# Patient Record
Sex: Female | Born: 1945
Health system: Southern US, Community
[De-identification: ages and names within clinical notes are randomized; demographics above are authoritative.]

## PROBLEM LIST (undated history)

## (undated) DIAGNOSIS — E785 Hyperlipidemia, unspecified: Secondary | ICD-10-CM

## (undated) DIAGNOSIS — E119 Type 2 diabetes mellitus without complications: Secondary | ICD-10-CM

## (undated) DIAGNOSIS — I10 Essential (primary) hypertension: Secondary | ICD-10-CM

---

## 2008-09-05 ENCOUNTER — Encounter: Admission: RE | Admit: 2008-09-05 | Discharge: 2008-09-05 | Payer: Self-pay | Admitting: Orthopedic Surgery

## 2014-08-21 ENCOUNTER — Other Ambulatory Visit (HOSPITAL_COMMUNITY): Payer: Self-pay | Admitting: Endocrinology

## 2014-08-21 DIAGNOSIS — Z1231 Encounter for screening mammogram for malignant neoplasm of breast: Secondary | ICD-10-CM

## 2014-09-11 ENCOUNTER — Ambulatory Visit (HOSPITAL_COMMUNITY)
Admission: RE | Admit: 2014-09-11 | Discharge: 2014-09-11 | Disposition: A | Discharge status: Home / Self Care | Payer: Medicare Other | Source: Ambulatory Visit | Attending: Endocrinology | Admitting: Endocrinology

## 2014-09-11 DIAGNOSIS — Z1231 Encounter for screening mammogram for malignant neoplasm of breast: Secondary | ICD-10-CM | POA: Insufficient documentation

## 2015-01-10 DIAGNOSIS — E118 Type 2 diabetes mellitus with unspecified complications: Secondary | ICD-10-CM | POA: Diagnosis not present

## 2015-01-10 DIAGNOSIS — E789 Disorder of lipoprotein metabolism, unspecified: Secondary | ICD-10-CM | POA: Diagnosis not present

## 2015-01-17 DIAGNOSIS — I10 Essential (primary) hypertension: Secondary | ICD-10-CM | POA: Diagnosis not present

## 2015-01-17 DIAGNOSIS — E118 Type 2 diabetes mellitus with unspecified complications: Secondary | ICD-10-CM | POA: Diagnosis not present

## 2015-01-17 DIAGNOSIS — E789 Disorder of lipoprotein metabolism, unspecified: Secondary | ICD-10-CM | POA: Diagnosis not present

## 2015-05-10 DIAGNOSIS — E119 Type 2 diabetes mellitus without complications: Secondary | ICD-10-CM | POA: Diagnosis not present

## 2015-05-10 DIAGNOSIS — I10 Essential (primary) hypertension: Secondary | ICD-10-CM | POA: Diagnosis not present

## 2015-05-10 DIAGNOSIS — E789 Disorder of lipoprotein metabolism, unspecified: Secondary | ICD-10-CM | POA: Diagnosis not present

## 2015-05-22 DIAGNOSIS — I10 Essential (primary) hypertension: Secondary | ICD-10-CM | POA: Diagnosis not present

## 2015-05-22 DIAGNOSIS — E118 Type 2 diabetes mellitus with unspecified complications: Secondary | ICD-10-CM | POA: Diagnosis not present

## 2015-05-22 DIAGNOSIS — E789 Disorder of lipoprotein metabolism, unspecified: Secondary | ICD-10-CM | POA: Diagnosis not present

## 2015-10-17 DIAGNOSIS — I10 Essential (primary) hypertension: Secondary | ICD-10-CM | POA: Diagnosis not present

## 2015-10-17 DIAGNOSIS — E789 Disorder of lipoprotein metabolism, unspecified: Secondary | ICD-10-CM | POA: Diagnosis not present

## 2015-10-17 DIAGNOSIS — E7889 Other lipoprotein metabolism disorders: Secondary | ICD-10-CM | POA: Diagnosis not present

## 2015-10-17 DIAGNOSIS — Z0000 Encounter for general adult medical examination without abnormal findings: Secondary | ICD-10-CM | POA: Diagnosis not present

## 2015-10-17 DIAGNOSIS — E119 Type 2 diabetes mellitus without complications: Secondary | ICD-10-CM | POA: Diagnosis not present

## 2015-10-17 DIAGNOSIS — N390 Urinary tract infection, site not specified: Secondary | ICD-10-CM | POA: Diagnosis not present

## 2015-10-24 DIAGNOSIS — Z0000 Encounter for general adult medical examination without abnormal findings: Secondary | ICD-10-CM | POA: Diagnosis not present

## 2015-10-24 DIAGNOSIS — Z23 Encounter for immunization: Secondary | ICD-10-CM | POA: Diagnosis not present

## 2015-10-24 DIAGNOSIS — M1990 Unspecified osteoarthritis, unspecified site: Secondary | ICD-10-CM | POA: Diagnosis not present

## 2015-10-24 DIAGNOSIS — I10 Essential (primary) hypertension: Secondary | ICD-10-CM | POA: Diagnosis not present

## 2015-10-24 DIAGNOSIS — N390 Urinary tract infection, site not specified: Secondary | ICD-10-CM | POA: Diagnosis not present

## 2015-10-25 DIAGNOSIS — N390 Urinary tract infection, site not specified: Secondary | ICD-10-CM | POA: Diagnosis not present

## 2015-11-04 DIAGNOSIS — H524 Presbyopia: Secondary | ICD-10-CM | POA: Diagnosis not present

## 2015-11-04 DIAGNOSIS — E119 Type 2 diabetes mellitus without complications: Secondary | ICD-10-CM | POA: Diagnosis not present

## 2015-11-04 DIAGNOSIS — H52223 Regular astigmatism, bilateral: Secondary | ICD-10-CM | POA: Diagnosis not present

## 2015-11-04 DIAGNOSIS — H5213 Myopia, bilateral: Secondary | ICD-10-CM | POA: Diagnosis not present

## 2015-11-12 DIAGNOSIS — M6281 Muscle weakness (generalized): Secondary | ICD-10-CM | POA: Diagnosis not present

## 2015-11-12 DIAGNOSIS — R262 Difficulty in walking, not elsewhere classified: Secondary | ICD-10-CM | POA: Diagnosis not present

## 2015-11-12 DIAGNOSIS — W010XXD Fall on same level from slipping, tripping and stumbling without subsequent striking against object, subsequent encounter: Secondary | ICD-10-CM | POA: Diagnosis not present

## 2015-11-12 DIAGNOSIS — R531 Weakness: Secondary | ICD-10-CM | POA: Diagnosis not present

## 2016-01-20 DIAGNOSIS — E119 Type 2 diabetes mellitus without complications: Secondary | ICD-10-CM | POA: Diagnosis not present

## 2016-01-20 DIAGNOSIS — E789 Disorder of lipoprotein metabolism, unspecified: Secondary | ICD-10-CM | POA: Diagnosis not present

## 2016-01-20 DIAGNOSIS — I10 Essential (primary) hypertension: Secondary | ICD-10-CM | POA: Diagnosis not present

## 2016-01-20 DIAGNOSIS — N390 Urinary tract infection, site not specified: Secondary | ICD-10-CM | POA: Diagnosis not present

## 2016-01-27 DIAGNOSIS — N390 Urinary tract infection, site not specified: Secondary | ICD-10-CM | POA: Diagnosis not present

## 2016-01-27 DIAGNOSIS — E118 Type 2 diabetes mellitus with unspecified complications: Secondary | ICD-10-CM | POA: Diagnosis not present

## 2016-01-27 DIAGNOSIS — I10 Essential (primary) hypertension: Secondary | ICD-10-CM | POA: Diagnosis not present

## 2016-06-15 ENCOUNTER — Other Ambulatory Visit: Payer: Self-pay | Admitting: Endocrinology

## 2016-06-15 DIAGNOSIS — Z1231 Encounter for screening mammogram for malignant neoplasm of breast: Secondary | ICD-10-CM

## 2016-06-22 ENCOUNTER — Ambulatory Visit
Admission: RE | Admit: 2016-06-22 | Discharge: 2016-06-22 | Disposition: A | Discharge status: Home / Self Care | Payer: Medicare Other | Source: Ambulatory Visit | Attending: Endocrinology | Admitting: Endocrinology

## 2016-06-22 DIAGNOSIS — Z1231 Encounter for screening mammogram for malignant neoplasm of breast: Secondary | ICD-10-CM

## 2016-11-09 DIAGNOSIS — N390 Urinary tract infection, site not specified: Secondary | ICD-10-CM | POA: Diagnosis not present

## 2016-11-09 DIAGNOSIS — I10 Essential (primary) hypertension: Secondary | ICD-10-CM | POA: Diagnosis not present

## 2016-11-09 DIAGNOSIS — E789 Disorder of lipoprotein metabolism, unspecified: Secondary | ICD-10-CM | POA: Diagnosis not present

## 2016-11-09 DIAGNOSIS — Z0000 Encounter for general adult medical examination without abnormal findings: Secondary | ICD-10-CM | POA: Diagnosis not present

## 2016-11-09 DIAGNOSIS — E119 Type 2 diabetes mellitus without complications: Secondary | ICD-10-CM | POA: Diagnosis not present

## 2016-11-19 DIAGNOSIS — I10 Essential (primary) hypertension: Secondary | ICD-10-CM | POA: Diagnosis not present

## 2016-11-19 DIAGNOSIS — E789 Disorder of lipoprotein metabolism, unspecified: Secondary | ICD-10-CM | POA: Diagnosis not present

## 2016-11-19 DIAGNOSIS — E118 Type 2 diabetes mellitus with unspecified complications: Secondary | ICD-10-CM | POA: Diagnosis not present

## 2016-12-15 ENCOUNTER — Emergency Department (HOSPITAL_COMMUNITY): Payer: Medicare Other

## 2016-12-15 ENCOUNTER — Emergency Department (HOSPITAL_BASED_OUTPATIENT_CLINIC_OR_DEPARTMENT_OTHER): Admit: 2016-12-15 | Discharge: 2016-12-15 | Disposition: A | Discharge status: Home / Self Care | Payer: Medicare Other

## 2016-12-15 ENCOUNTER — Encounter (HOSPITAL_COMMUNITY): Payer: Self-pay | Admitting: Emergency Medicine

## 2016-12-15 ENCOUNTER — Observation Stay (HOSPITAL_COMMUNITY)
Admission: EM | Admit: 2016-12-15 | Discharge: 2016-12-17 | Disposition: A | Discharge status: Skilled Nursing Facility | Payer: Medicare Other | Attending: Family Medicine | Admitting: Family Medicine

## 2016-12-15 DIAGNOSIS — E669 Obesity, unspecified: Secondary | ICD-10-CM | POA: Insufficient documentation

## 2016-12-15 DIAGNOSIS — I10 Essential (primary) hypertension: Secondary | ICD-10-CM | POA: Diagnosis present

## 2016-12-15 DIAGNOSIS — Y92002 Bathroom of unspecified non-institutional (private) residence single-family (private) house as the place of occurrence of the external cause: Secondary | ICD-10-CM | POA: Diagnosis not present

## 2016-12-15 DIAGNOSIS — Z23 Encounter for immunization: Secondary | ICD-10-CM | POA: Diagnosis not present

## 2016-12-15 DIAGNOSIS — E860 Dehydration: Secondary | ICD-10-CM | POA: Diagnosis not present

## 2016-12-15 DIAGNOSIS — S8251XA Displaced fracture of medial malleolus of right tibia, initial encounter for closed fracture: Secondary | ICD-10-CM | POA: Diagnosis not present

## 2016-12-15 DIAGNOSIS — E119 Type 2 diabetes mellitus without complications: Secondary | ICD-10-CM

## 2016-12-15 DIAGNOSIS — Z7984 Long term (current) use of oral hypoglycemic drugs: Secondary | ICD-10-CM | POA: Diagnosis not present

## 2016-12-15 DIAGNOSIS — Z888 Allergy status to other drugs, medicaments and biological substances status: Secondary | ICD-10-CM | POA: Insufficient documentation

## 2016-12-15 DIAGNOSIS — E870 Hyperosmolality and hypernatremia: Secondary | ICD-10-CM | POA: Diagnosis not present

## 2016-12-15 DIAGNOSIS — M1711 Unilateral primary osteoarthritis, right knee: Secondary | ICD-10-CM | POA: Diagnosis not present

## 2016-12-15 DIAGNOSIS — Y9389 Activity, other specified: Secondary | ICD-10-CM | POA: Insufficient documentation

## 2016-12-15 DIAGNOSIS — S82843A Displaced bimalleolar fracture of unspecified lower leg, initial encounter for closed fracture: Secondary | ICD-10-CM | POA: Diagnosis present

## 2016-12-15 DIAGNOSIS — E876 Hypokalemia: Secondary | ICD-10-CM | POA: Diagnosis not present

## 2016-12-15 DIAGNOSIS — T148XXA Other injury of unspecified body region, initial encounter: Secondary | ICD-10-CM | POA: Diagnosis not present

## 2016-12-15 DIAGNOSIS — M79609 Pain in unspecified limb: Secondary | ICD-10-CM | POA: Diagnosis not present

## 2016-12-15 DIAGNOSIS — S8991XA Unspecified injury of right lower leg, initial encounter: Secondary | ICD-10-CM | POA: Diagnosis not present

## 2016-12-15 DIAGNOSIS — M25561 Pain in right knee: Secondary | ICD-10-CM | POA: Diagnosis not present

## 2016-12-15 DIAGNOSIS — E785 Hyperlipidemia, unspecified: Secondary | ICD-10-CM | POA: Diagnosis not present

## 2016-12-15 DIAGNOSIS — Z7982 Long term (current) use of aspirin: Secondary | ICD-10-CM | POA: Diagnosis not present

## 2016-12-15 DIAGNOSIS — Y998 Other external cause status: Secondary | ICD-10-CM | POA: Diagnosis not present

## 2016-12-15 DIAGNOSIS — Y92009 Unspecified place in unspecified non-institutional (private) residence as the place of occurrence of the external cause: Secondary | ICD-10-CM

## 2016-12-15 DIAGNOSIS — S82891A Other fracture of right lower leg, initial encounter for closed fracture: Secondary | ICD-10-CM | POA: Diagnosis not present

## 2016-12-15 DIAGNOSIS — S82841A Displaced bimalleolar fracture of right lower leg, initial encounter for closed fracture: Principal | ICD-10-CM | POA: Insufficient documentation

## 2016-12-15 DIAGNOSIS — W010XXA Fall on same level from slipping, tripping and stumbling without subsequent striking against object, initial encounter: Secondary | ICD-10-CM | POA: Insufficient documentation

## 2016-12-15 DIAGNOSIS — W19XXXA Unspecified fall, initial encounter: Secondary | ICD-10-CM

## 2016-12-15 DIAGNOSIS — M7989 Other specified soft tissue disorders: Secondary | ICD-10-CM

## 2016-12-15 HISTORY — DX: Type 2 diabetes mellitus without complications: E11.9

## 2016-12-15 HISTORY — DX: Essential (primary) hypertension: I10

## 2016-12-15 HISTORY — DX: Hyperlipidemia, unspecified: E78.5

## 2016-12-15 LAB — CBC WITH DIFFERENTIAL/PLATELET
Basophils Absolute: 0 10*3/uL (ref 0.0–0.1)
Basophils Relative: 0 %
EOS ABS: 0.1 10*3/uL (ref 0.0–0.7)
Eosinophils Relative: 0 %
HCT: 49.6 % — ABNORMAL HIGH (ref 36.0–46.0)
HEMOGLOBIN: 16.9 g/dL — AB (ref 12.0–15.0)
LYMPHS ABS: 2.2 10*3/uL (ref 0.7–4.0)
LYMPHS PCT: 14 %
MCH: 29.7 pg (ref 26.0–34.0)
MCHC: 34.1 g/dL (ref 30.0–36.0)
MCV: 87.2 fL (ref 78.0–100.0)
MONOS PCT: 10 %
Monocytes Absolute: 1.5 10*3/uL — ABNORMAL HIGH (ref 0.1–1.0)
NEUTROS PCT: 76 %
Neutro Abs: 12 10*3/uL — ABNORMAL HIGH (ref 1.7–7.7)
Platelets: 210 10*3/uL (ref 150–400)
RBC: 5.69 MIL/uL — AB (ref 3.87–5.11)
RDW: 13.7 % (ref 11.5–15.5)
WBC: 15.8 10*3/uL — ABNORMAL HIGH (ref 4.0–10.5)

## 2016-12-15 LAB — BASIC METABOLIC PANEL
Anion gap: 10 (ref 5–15)
BUN: 23 mg/dL — AB (ref 6–20)
CHLORIDE: 108 mmol/L (ref 101–111)
CO2: 29 mmol/L (ref 22–32)
CREATININE: 0.77 mg/dL (ref 0.44–1.00)
Calcium: 9.2 mg/dL (ref 8.9–10.3)
GFR calc Af Amer: >60 mL/min (ref >60)
GFR calc non Af Amer: >60 mL/min (ref >60)
GLUCOSE: 154 mg/dL — AB (ref 65–99)
POTASSIUM: 3.1 mmol/L — AB (ref 3.5–5.1)
SODIUM: 147 mmol/L — AB (ref 135–145)

## 2016-12-15 LAB — CK: Total CK: 161 U/L (ref 38–234)

## 2016-12-15 MED ORDER — IBUPROFEN 200 MG PO TABS: 400.0000 mg | ORAL_TABLET | Freq: Four times a day (QID) | ORAL | Status: DC | PRN

## 2016-12-15 MED ORDER — AMLODIPINE BESYLATE 5 MG PO TABS
5.0000 mg | ORAL_TABLET | Freq: Every day | ORAL | Status: DC
  Administered 2016-12-16 – 2016-12-17 (×2): 5 mg via ORAL
  Filled 2016-12-15 (×2): qty 1

## 2016-12-15 MED ORDER — SODIUM CHLORIDE 0.9 % IV SOLN
INTRAVENOUS | Status: DC
  Administered 2016-12-15 – 2016-12-17 (×3): via INTRAVENOUS

## 2016-12-15 MED ORDER — ASPIRIN EC 81 MG PO TBEC
81.0000 mg | DELAYED_RELEASE_TABLET | Freq: Every day | ORAL | Status: DC
  Administered 2016-12-16 – 2016-12-17 (×2): 81 mg via ORAL
  Filled 2016-12-15 (×2): qty 1

## 2016-12-15 MED ORDER — HYDROCHLOROTHIAZIDE 25 MG PO TABS: 25.0000 mg | ORAL_TABLET | Freq: Every day | ORAL | Status: DC

## 2016-12-15 MED ORDER — SODIUM CHLORIDE 0.9 % IV BOLUS (SEPSIS): 500.0000 mL | Freq: Once | INTRAVENOUS | Status: DC

## 2016-12-15 MED ORDER — ADULT MULTIVITAMIN W/MINERALS CH
1.0000 | ORAL_TABLET | Freq: Every day | ORAL | Status: DC
  Administered 2016-12-16 – 2016-12-17 (×2): 1 via ORAL
  Filled 2016-12-15 (×2): qty 1

## 2016-12-15 MED ORDER — POTASSIUM CHLORIDE CRYS ER 20 MEQ PO TBCR
40.0000 meq | EXTENDED_RELEASE_TABLET | Freq: Once | ORAL | Status: AC
  Administered 2016-12-15: 40 meq via ORAL
  Filled 2016-12-15: qty 2

## 2016-12-15 MED ORDER — METFORMIN HCL ER 500 MG PO TB24
1000.0000 mg | ORAL_TABLET | Freq: Every day | ORAL | Status: DC
  Administered 2016-12-16: 1000 mg via ORAL
  Filled 2016-12-15: qty 2

## 2016-12-15 MED ORDER — IRBESARTAN 150 MG PO TABS
300.0000 mg | ORAL_TABLET | Freq: Every day | ORAL | Status: DC
  Administered 2016-12-16 – 2016-12-17 (×2): 300 mg via ORAL
  Filled 2016-12-15 (×2): qty 2

## 2016-12-15 MED ORDER — POTASSIUM CHLORIDE CRYS ER 20 MEQ PO TBCR
40.0000 meq | EXTENDED_RELEASE_TABLET | Freq: Once | ORAL | Status: AC
  Administered 2016-12-15: 40 meq via ORAL

## 2016-12-15 MED ORDER — CANAGLIFLOZIN 300 MG PO TABS
300.0000 mg | ORAL_TABLET | Freq: Every day | ORAL | Status: DC
  Administered 2016-12-16: 300 mg via ORAL
  Filled 2016-12-15: qty 1

## 2016-12-15 MED ORDER — ENOXAPARIN SODIUM 40 MG/0.4ML ~~LOC~~ SOLN
40.0000 mg | Freq: Every day | SUBCUTANEOUS | Status: DC
  Administered 2016-12-15 – 2016-12-16 (×2): 40 mg via SUBCUTANEOUS
  Filled 2016-12-15 (×2): qty 0.4

## 2016-12-15 MED ORDER — NEBIVOLOL HCL 10 MG PO TABS
10.0000 mg | ORAL_TABLET | Freq: Every day | ORAL | Status: DC
Start: 2016-12-16 — End: 2016-12-17
  Administered 2016-12-16 – 2016-12-17 (×2): 10 mg via ORAL
  Filled 2016-12-15 (×2): qty 1

## 2016-12-15 MED ORDER — OLMESARTAN MEDOXOMIL-HCTZ 40-25 MG PO TABS: 1.0000 | ORAL_TABLET | Freq: Every day | ORAL | Status: DC

## 2016-12-15 MED ORDER — OXYCODONE-ACETAMINOPHEN 5-325 MG PO TABS: 1.0000 | ORAL_TABLET | Freq: Once | ORAL | Status: DC

## 2016-12-15 MED ORDER — DAPAGLIFLOZIN PRO-METFORMIN ER 10-1000 MG PO TB24: 1.0000 | ORAL_TABLET | Freq: Every day | ORAL | Status: DC

## 2016-12-15 MED ORDER — ATORVASTATIN CALCIUM 10 MG PO TABS
20.0000 mg | ORAL_TABLET | Freq: Every day | ORAL | Status: DC
  Administered 2016-12-16 – 2016-12-17 (×2): 20 mg via ORAL
  Filled 2016-12-15 (×2): qty 2

## 2016-12-15 NOTE — Progress Notes (Signed)
EDCM went to speak to patient at bedside, however staff at bedside performing procedure.

## 2016-12-15 NOTE — ED Provider Notes (Signed)
Patient reports she fell twice 5 days ago and again 4 days ago. She could not get up after the second fall and landed floor until today when her son discovered her and called 911. She complains of right leg pain from the knee distally. She offers no other complaint. On exam she is alert no distress H HEENT exam psych atraumatic neck supple abdomen nondistended nontender. Pelvis stable nontender. Right lower extremity skin is intact. She is tender at the anterior knee. She is mildly tender at the calf. Ankle and foot are nontender. DP pulse 2+ good capillary refill. All other extremities no contusion abrasion or tenderness neurovascularly intact. Entire spine is nontender.   Doug SouSam Sylva Overley, MD 12/15/16 [phone]

## 2016-12-15 NOTE — ED Notes (Signed)
Patient transported to X-ray 

## 2016-12-15 NOTE — Progress Notes (Signed)
EDCM spoke to patient at bedside. Patient presents to ED post fall sustaining right ankle fracture.  Patient lives alone at home.  She reports she has three steps to get into her house.  She has never had home health services before.  She reports she has friend named Porfirio MylarCarmen who lives next door who she "thinks" may be able to help her at home.  She is unsure.  Patient has a son in [place] who is a physician at a hospital.  [facility] to speak to her son and to tell him that her ankle was broken.  Patient is agreeable to go to SNF if needed.  She reports she wants to go where it is the safest for her.  She reports she does not have any equipment at home.  EDCM provided patient [place], explained services.  EDCm also provided patient with list of private duty nursing agencies and explained it would be an out of pocket expense.   Patient reports she is not having any pain. EDCM spoke to patient's son [name].  [name] is agreeable for patient to go to a SNF as he would like the safest disposition for his mother.  He reports he could maybe take her for a few days but will be going out of town. EDCM informed [name] that patient will be safe in the Ed this evening, will be evaluated by PT in the am to determine safest disposition for patient.  EDCM also explained home health services and private duty nursing services to patient's son. Patient's son asking what will be covered [facility] for [facility] stay.  EDCM explained to patient's son that Medicare usually covers the first 20 days at 100% and starting day 21 patient is responsible for 20 %.  EDCM informed patient's son unsure if [facility] patient's stay at SNF and will have social worker contact him tomorrow for more details. Informed patient's son that if patient is unable to be placed, patient will go home with home health services.   Patient's son is asking why patient is not being admitted as observation  for intractable pain and PT eval.  EDCM explained to patient's son that patient reports she is not having any pain and is comfortable.  Explained that a safe disposition can be obtained from the ED.  Patient's son still asking why patient is not being admitted and also wondering what type of fracture she has and did an orthopedic specialist see the patient.  EDCM received Peter's permission to have EDPA contact him in regards to disposition and if orthopedic surgeon saw patient.  EDCM informed Shawn PA to call patient's son [name] at [phone].  No further EDCM needs at this time.

## 2016-12-15 NOTE — ED Triage Notes (Signed)
Pt slipped in bathroom Friday, December 22nd and has been lying on bathroom floor since then. Son called police to check on her today and discovered her on the floor. Pt c/o RLE pain, no deformity. Pt denies LOC and arrived alert and oriented.

## 2016-12-15 NOTE — ED Notes (Signed)
Called report to [phone]. Nurse Almedia BallsLakia, RN is in with a patient and will call back for report.

## 2016-12-15 NOTE — ED Notes (Signed)
Spoke with son, Alycia Patteneter [place] [phone], he wishes for her to have a home health eval.

## 2016-12-15 NOTE — H&P (Signed)
History and Physical    Loretta DibbleCarol A [name] ZOX:[id number] DOB: 08/23/1946 DOA: 12/15/2016   PCP: Michiel SitesKOHUT,WALTER DENNIS, MD Chief Complaint:  Chief Complaint  Patient presents with  . Fall  . Leg Pain    HPI: Loretta DibbleCarol A [name] is a 70 y.o. female with medical history significant of DM, HTN, obesity.  Patient presents to the ED with c/o being found down in bathroom by Son who called 911.  Patient fell 2x: first time 5 days ago and then again 4 days ago.  Couldn't get up after second fall.  Remained on floor until son found her today and called 911.  Has R leg pain from knee distally.  ED Course: Bimaleolar R ankle fracture.  Review of Systems: As per HPI otherwise 10 point review of systems negative.    Past Medical History:  Diagnosis Date  . Diabetes mellitus without complication (HCC)   . Hyperlipidemia   . Hypertension     History reviewed. No pertinent surgical history.   reports that she has never smoked. She has never used smokeless tobacco. She reports that she does not drink alcohol or use drugs.  Allergies  Allergen Reactions  . Pravastatin Other (See Comments)    Caused leg paralysis     Family History  Problem Relation Age of Onset  . Osteoporosis Neg Hx    Son is alive and well, is a hospitalist at [employer].   Prior to Admission medications   Medication Sig Start Date End Date Taking? Authorizing Provider  amLODipine (NORVASC) 5 MG tablet Take 1 tablet by mouth daily. 09/27/16  Yes Historical Provider, MD  aspirin EC 81 MG tablet Take 81 mg by mouth daily.   Yes Historical Provider, MD  atorvastatin (LIPITOR) 20 MG tablet Take 1 tablet by mouth daily. 10/19/16  Yes Historical Provider, MD  BYSTOLIC 10 MG tablet Take 1 tablet by mouth daily. 09/21/16  Yes Historical Provider, MD  furosemide (LASIX) 40 MG tablet Take 1 tablet by mouth daily as needed for fluid.  11/23/16  Yes Historical Provider, MD  ibuprofen (ADVIL,MOTRIN) 200 MG tablet Take 400 mg by mouth  every 6 (six) hours as needed for moderate pain.   Yes Historical Provider, MD  Multiple Vitamin (MULTIVITAMIN WITH MINERALS) TABS tablet Take 1 tablet by mouth daily.   Yes Historical Provider, MD  olmesartan-hydrochlorothiazide (BENICAR HCT) 40-25 MG tablet Take 1 tablet by mouth daily. 11/10/16  Yes Historical Provider, MD  XIGDUO XR 09-999 MG TB24 Take 1 tablet by mouth daily. 11/17/16  Yes Historical Provider, MD    Physical Exam: Vitals:   12/15/16 1538 12/15/16 1849  BP: 164/94 (!) 151/102  Pulse: 82 78  Resp: 20 16  Temp: 97.7 F (36.5 C)   TempSrc: Oral   SpO2: 95% 98%      Constitutional: NAD, calm, comfortable Eyes: PERRL, lids and conjunctivae normal ENMT: Mucous membranes are moist. Posterior pharynx clear of any exudate or lesions.Normal dentition.  Neck: normal, supple, no masses, no thyromegaly Respiratory: clear to auscultation bilaterally, no wheezing, no crackles. Normal respiratory effort. No accessory muscle use.  Cardiovascular: Regular rate and rhythm, no murmurs / rubs / gallops. No extremity edema. 2+ pedal pulses. No carotid bruits.  Abdomen: no tenderness, no masses palpated. No hepatosplenomegaly. Bowel sounds positive.  Musculoskeletal: R ankle in cast Skin: no rashes, lesions, ulcers. No induration Neurologic: CN 2-12 grossly intact. Sensation intact, DTR normal. Strength 5/5 in all 4.  Psychiatric: Normal judgment and insight. Alert and  oriented x 3. Normal mood.    Labs on Admission: I have personally reviewed following labs and imaging studies  CBC:  Recent Labs Lab 12/15/16 1651  WBC 15.8*  NEUTROABS 12.0*  HGB 16.9*  HCT 49.6*  MCV 87.2  PLT 210   Basic Metabolic Panel:  Recent Labs Lab 12/15/16 1651  NA 147*  K 3.1*  CL 108  CO2 29  GLUCOSE 154*  BUN 23*  CREATININE 0.77  CALCIUM 9.2   GFR: CrCl cannot be calculated (Unknown ideal weight.). Liver Function Tests: No results for input(s): AST, ALT, ALKPHOS, BILITOT,  PROT, ALBUMIN in the last 168 hours. No results for input(s): LIPASE, AMYLASE in the last 168 hours. No results for input(s): AMMONIA in the last 168 hours. Coagulation Profile: No results for input(s): INR, PROTIME in the last 168 hours. Cardiac Enzymes:  Recent Labs Lab 12/15/16 1651  CKTOTAL 161   BNP (last 3 results) No results for input(s): PROBNP in the last 8760 hours. HbA1C: No results for input(s): HGBA1C in the last 72 hours. CBG: No results for input(s): GLUCAP in the last 168 hours. Lipid Profile: No results for input(s): CHOL, HDL, LDLCALC, TRIG, CHOLHDL, LDLDIRECT in the last 72 hours. Thyroid Function Tests: No results for input(s): TSH, T4TOTAL, FREET4, T3FREE, THYROIDAB in the last 72 hours. Anemia Panel: No results for input(s): VITAMINB12, FOLATE, FERRITIN, TIBC, IRON, RETICCTPCT in the last 72 hours. Urine analysis: No results found for: COLORURINE, APPEARANCEUR, LABSPEC, PHURINE, GLUCOSEU, HGBUR, BILIRUBINUR, KETONESUR, PROTEINUR, UROBILINOGEN, NITRITE, LEUKOCYTESUR Sepsis Labs: @LABRCNTIP (procalcitonin:4,lacticidven:4) )No results found for this or any previous visit (from the past 240 hour(s)).   Radiological Exams on Admission: Dg Ankle Complete Right  Result Date: 12/15/2016 CLINICAL DATA:  Fall on December 22, unable to bear weight. The patient had a second fall on 12/23. EXAM: RIGHT ANKLE - COMPLETE 3+ VIEW COMPARISON:  None. FINDINGS: Lateral malleolus fracture and nondisplaced fracture the posterior malleolus. Tibiotalar distance maintained common no visible fracture of the medial malleolus. Plantar and Achilles calcaneal spurs. IMPRESSION: 1. Stage III Weber B fracture with involvement of the lateral malleolus and posterior malleolus. Electronically Signed   By: Gaylyn RongWalter  Liebkemann M.D.   On: 12/15/2016 18:12   Dg Knee Complete 4 Views Right  Result Date: 12/15/2016 CLINICAL DATA:  Falls in early December, knee pain. EXAM: RIGHT KNEE - COMPLETE 4+  VIEW COMPARISON:  None. FINDINGS: Prominent tricompartmental spurring. Suspected 2.0 by 0.7 by 1.1 cm free osteochondral fragment in the intercondylar notch anteriorly. Suspected small knee effusion in the suprapatellar bursa. No acute fracture in the knee is identified. IMPRESSION: 1. Well corticated osteochondral free fragment centrally in the knee joint. 2. Prominent tricompartmental spurring.  Osteoarthritis. 3. Suspected knee effusion. Electronically Signed   By: Gaylyn RongWalter  Liebkemann M.D.   On: 12/15/2016 18:13    EKG: Independently reviewed.  Assessment/Plan Principal Problem:   Bimalleolar ankle fracture Active Problems:   DM2 (diabetes mellitus, type 2) (HCC)   HTN (hypertension)   Hypokalemia   Hypernatremia    1. Bimalleolar ankle fracture - 1. PT eval and treat 2. SW consult - patient likely to SNF as safest option as she is not currently safe to go home alone and is lucky this time she didn't wind up with other complications of prolonged down time on ground (dehydration, rhabdo, etc). (see EDCM note for documentation) 2. DM2 - resume home meds in AM 3. HTN - resume home meds in AM 4. Hypokalemia - replace, repeat BMP in AM  5. Hypernatremia - gentle hydration suspect due to dehydration from down time, repeat BMP in AM   DVT prophylaxis: Lovenox Code Status: Full Family Communication: Spoke with Son on phone who will try to be over here tomorrow evening (currently he is a hospitalist on shift at [employer] [facility] in [place]). Consults called: None Admission status: Place in obs   [place], Heywood Iles. DO [facility] Pager [phone] from 7PM-7AM  If 7AM-7PM, please contact the day physician for the patient [email or web address] Password [email or web address]  12/15/2016, 10:20 PM

## 2016-12-15 NOTE — ED Notes (Signed)
Bed: WA11 Expected date:  Expected time:  Means of arrival:  Comments: No bed 

## 2016-12-15 NOTE — Progress Notes (Signed)
*  PRELIMINARY RESULTS* Vascular Ultrasound Right lower extremity venous duplex has been completed.  Preliminary findings: No evidence of deep vein thrombosis or baker's cysts in the right lower extremity.  Preliminary results given to the PA @ 19:55.   Loretta FischerCharlotte C Melvenia Favela 12/15/2016, 8:00 PM

## 2016-12-15 NOTE — Discharge Instructions (Signed)
Your xray today shows a right ankle fracture.  We have placed you in a splint and given you a walker.  Do not bear any weight on this ankle.  Please call Dr. Ophelia CharterYates' office tomorrow to schedule a follow up appointment in his office, you will need surgery at a later date.  Take Percocet every 4 hours as needed for pain.  Return to the ED for any new or worsening symptoms.

## 2016-12-15 NOTE — ED Provider Notes (Signed)
Loretta [name] is a 70 y.o. female, with a history of DM and HTN, presenting to the ED with injuries from a fall. She states she is pain free presently. She gives permission for us to talk with her son about any of her medical care.  Cheri FowlerKayla Rose, PA-C HPI: "Loretta [name] is a 70 y.o. female with PMH significant for DM, HLD, and HTN who presents by EMS from home for evaluation of fall 5 days ago.  Patient states she was walking in the bathroom and slipped on water on the floor causing her ankle to roll and fall backwards.  She states she was unable to get up after falling and has been lying on the ground since her fall.  She lives at home alone and was not able to contact anyone for help.  Her son had not heard from her in a couple of days and contacts the police to investigate who then found her lying on the floor.  She had difficulty ambulating with EMS due to right ankle pain.  She denies pain elsewhere.  She denies syncope, CP, SOB, or palpitations. She states she does not feel like she will be able to care for herself is she is discharged."  Past Medical History:  Diagnosis Date  . Diabetes mellitus without complication (HCC)   . Hyperlipidemia   . Hypertension      Physical Exam  BP (!) 151/102   Pulse 78   Temp 97.7 F (36.5 C) (Oral)   Resp 16   SpO2 98%   Physical Exam  Constitutional: She appears well-developed and well-nourished. No distress.  HENT:  Head: Normocephalic and atraumatic.  Eyes: Conjunctivae are normal.  Neck: Neck supple.  Cardiovascular: Normal rate, regular rhythm, normal heart sounds and intact distal pulses.   Pulmonary/Chest: Effort normal and breath sounds normal. No respiratory distress.  Abdominal: Soft. There is no tenderness. There is no guarding.  Musculoskeletal: She exhibits no edema.  Short leg splint in place on the right lower leg. Circulation, motor, and sensory are intact in the right toes.   Lymphadenopathy:    She has no cervical  adenopathy.  Neurological: She is alert.  Skin: Skin is warm and dry. Capillary refill takes less than 2 seconds. She is not diaphoretic.  Psychiatric: She has a normal mood and affect. Her behavior is normal.  Nursing note and vitals reviewed.   ED Course  Procedures  MDM  Handoff from Cheri FowlerKayla Rose, PA-C.  9:45 PM Spoke with patient's son, Loretta Patteneter Schroeck, MD [phone]). Son was filled in on the patient's status per patient request. Son does not live in the area.  Patient will need observation admission due to being unsafe for discharge. Pt lives alone, has multiple stairs in her home, and she was found on the floor after a fall. Was there for 5 days. Police found her after a well check request from her son. Pt is not ambulatory due to her ankle fracture. She will need a SW consult for likely SNF placement as well as a PT assessment.   Spoke with Dr. Julian ReilGardner, Hospitalist, who agreed to admit patient to MedSurg observation.  Vitals:   12/15/16 1538 12/15/16 1849 12/15/16 2237 12/15/16 2349  BP: 164/94 (!) 151/102 154/68 (!) 149/74  Pulse: 82 78 82 79  Resp: 20 16 18 20   Temp: 97.7 F (36.5 C)   98.1 F (36.7 C)  TempSrc: Oral   Oral  SpO2: 95% 98% 99% 92%  Anselm PancoastShawn C Joy, PA-C 12/16/16 0003    [name], MD 12/16/16 [phone]

## 2016-12-15 NOTE — ED Provider Notes (Signed)
[facility]-EMERGENCY DEPT Provider Note   CSN: [id number] Arrival date & time: 12/15/16  1508     History   Chief Complaint Chief Complaint  Patient presents with  . Fall  . Leg Pain    HPI Loretta [name] is a 70 y.o. female.  HPI Loretta [name] is a 70 y.o. female with PMH significant for DM, HLD, and HTN who presents by EMS from home for evaluation of fall 5 days ago.  Patient states she was walking in the bathroom and slipped on water on the floor causing her ankle to roll and fall backwards.  She states she was unable to get up after falling and has been lying on the ground since her fall.  She lives at home alone and was not able to contact anyone for help.  Her son had not heard from her in a couple of days and contacts the police to investigate who then found her lying on the floor.  She had difficulty ambulating with EMS due to right ankle pain.  She denies pain elsewhere.  She denies syncope, CP, SOB, or palpitations. She states she does not feel like she will be able to care for herself is she is discharged.  Past Medical History:  Diagnosis Date  . Diabetes mellitus without complication (HCC)   . Hyperlipidemia   . Hypertension     There are no active problems to display for this patient.   History reviewed. No pertinent surgical history.  OB History    No data available       Home Medications    Prior to Admission medications   Medication Sig Start Date End Date Taking? Authorizing Provider  amLODipine (NORVASC) 5 MG tablet Take 1 tablet by mouth daily. 09/27/16  Yes Historical Provider, MD  aspirin EC 81 MG tablet Take 81 mg by mouth daily.   Yes Historical Provider, MD  atorvastatin (LIPITOR) 20 MG tablet Take 1 tablet by mouth daily. 10/19/16  Yes Historical Provider, MD  BYSTOLIC 10 MG tablet Take 1 tablet by mouth daily. 09/21/16  Yes Historical Provider, MD  furosemide (LASIX) 40 MG tablet Take 1 tablet by mouth daily as needed for fluid.  11/23/16  Yes  Historical Provider, MD  ibuprofen (ADVIL,MOTRIN) 200 MG tablet Take 400 mg by mouth every 6 (six) hours as needed for moderate pain.   Yes Historical Provider, MD  Multiple Vitamin (MULTIVITAMIN WITH MINERALS) TABS tablet Take 1 tablet by mouth daily.   Yes Historical Provider, MD  olmesartan-hydrochlorothiazide (BENICAR HCT) 40-25 MG tablet Take 1 tablet by mouth daily. 11/10/16  Yes Historical Provider, MD  XIGDUO XR 09-999 MG TB24 Take 1 tablet by mouth daily. 11/17/16  Yes Historical Provider, MD    Family History History reviewed. No pertinent family history.  Social History Social History  Substance Use Topics  . Smoking status: Not on file  . Smokeless tobacco: Not on file  . Alcohol use Not on file     Allergies   Pravastatin   Review of Systems Review of Systems All other systems negative unless otherwise stated in HPI   Physical Exam Updated Vital Signs BP (!) 151/102   Pulse 78   Temp 97.7 F (36.5 C) (Oral)   Resp 16   SpO2 98%   Physical Exam  Constitutional: She is oriented to person, place, and time. She appears well-developed and well-nourished.  Non-toxic appearance. She does not have a sickly appearance. She does not appear ill.  HENT:  Head: Normocephalic and atraumatic.  Mouth/Throat: Oropharynx is clear and moist.  Eyes: Conjunctivae are normal.  Neck: Normal range of motion. Neck supple.  Cardiovascular: Normal rate and regular rhythm.   Pulses:      Dorsalis pedis pulses are 2+ on the right side, and 2+ on the left side.  Pulmonary/Chest: Effort normal and breath sounds normal. No accessory muscle usage or stridor. No respiratory distress. She has no wheezes. She has no rhonchi. She has no rales.  Abdominal: Soft. Bowel sounds are normal. She exhibits no distension. There is no tenderness.  Musculoskeletal: Normal range of motion.  No C/T/L midline tenderness.  Pelvis stable and without tenderness. Right ankle with swelling, bruising, and  tenderness about lateral ankle.   Lymphadenopathy:    She has no cervical adenopathy.  Neurological: She is alert and oriented to person, place, and time.  Strength and sensation intact throughout lower extremities.   Skin: Skin is warm and dry.  Psychiatric: She has a normal mood and affect. Her behavior is normal.     ED Treatments / Results  Labs (all labs ordered are listed, but only abnormal results are displayed) Labs Reviewed  CBC WITH DIFFERENTIAL/PLATELET - Abnormal; Notable for the following:       Result Value   WBC 15.8 (*)    RBC 5.69 (*)    Hemoglobin 16.9 (*)    HCT 49.6 (*)    Neutro Abs 12.0 (*)    Monocytes Absolute 1.5 (*)    All other components within normal limits  BASIC METABOLIC PANEL - Abnormal; Notable for the following:    Sodium 147 (*)    Potassium 3.1 (*)    Glucose, Bld 154 (*)    BUN 23 (*)    All other components within normal limits  CK    EKG  EKG Interpretation None       Radiology Dg Ankle Complete Right  Result Date: 12/15/2016 CLINICAL DATA:  Fall on December 22, unable to bear weight. The patient had a second fall on 12/23. EXAM: RIGHT ANKLE - COMPLETE 3+ VIEW COMPARISON:  None. FINDINGS: Lateral malleolus fracture and nondisplaced fracture the posterior malleolus. Tibiotalar distance maintained common no visible fracture of the medial malleolus. Plantar and Achilles calcaneal spurs. IMPRESSION: 1. Stage III Weber B fracture with involvement of the lateral malleolus and posterior malleolus. Electronically Signed   By: Gaylyn RongWalter  Liebkemann M.D.   On: 12/15/2016 18:12   Dg Knee Complete 4 Views Right  Result Date: 12/15/2016 CLINICAL DATA:  Falls in early December, knee pain. EXAM: RIGHT KNEE - COMPLETE 4+ VIEW COMPARISON:  None. FINDINGS: Prominent tricompartmental spurring. Suspected 2.0 by 0.7 by 1.1 cm free osteochondral fragment in the intercondylar notch anteriorly. Suspected small knee effusion in the suprapatellar bursa. No  acute fracture in the knee is identified. IMPRESSION: 1. Well corticated osteochondral free fragment centrally in the knee joint. 2. Prominent tricompartmental spurring.  Osteoarthritis. 3. Suspected knee effusion. Electronically Signed   By: Gaylyn RongWalter  Liebkemann M.D.   On: 12/15/2016 18:13    Procedures Procedures (including critical care time)  Medications Ordered in ED Medications  oxyCODONE-acetaminophen (PERCOCET/ROXICET) 5-325 MG per tablet 1 tablet (not administered)  sodium chloride 0.9 % bolus 500 mL (not administered)  potassium chloride SA (K-DUR,KLOR-CON) CR tablet 40 mEq (40 mEq Oral Given 12/15/16 1814)     Initial Impression / Assessment and Plan / ED Course  I have reviewed the triage vital signs and the nursing notes.  Pertinent  labs & imaging results that were available during my care of the patient were reviewed by me and considered in my medical decision making (see chart for details).  Clinical Course    Patient presents with mechanical fall and right ankle pain.   Patient lives at home alone and has 3 steps to get into her house.  She laid on the floor for 5 days until her son called [employer] and EMS to her house.  No evidence of rhabdomyolysis or AKI. Leukocytosis, but this is likely due to trauma and not infectious cause.  DVT study negative. Neurovascularly intact.  Xray shows Weber B fracture.  Spoke with Dr. Ophelia CharterYates, orthopedics, who recommends posterior splint and walker. CM and SW have been consulted.  Plan to ambulate with walker.  SW will evaluate the patient in the AM for likely SNF placement.  Case has been discussed with and seen by Dr. Ethelda ChickJacubowitz who agrees with the above plan.   Final Clinical Impressions(s) / ED Diagnoses   Final diagnoses:  Fall in home, initial encounter  Closed fracture of right ankle, initial encounter    New Prescriptions New Prescriptions   No medications on file     Cheri FowlerKayla Nihaal Friesen, PA-C 12/15/16 2050    [name],  MD 12/16/16 0004

## 2016-12-16 DIAGNOSIS — E119 Type 2 diabetes mellitus without complications: Secondary | ICD-10-CM | POA: Diagnosis not present

## 2016-12-16 DIAGNOSIS — S82841A Displaced bimalleolar fracture of right lower leg, initial encounter for closed fracture: Secondary | ICD-10-CM

## 2016-12-16 DIAGNOSIS — E876 Hypokalemia: Secondary | ICD-10-CM | POA: Diagnosis not present

## 2016-12-16 DIAGNOSIS — E870 Hyperosmolality and hypernatremia: Secondary | ICD-10-CM

## 2016-12-16 DIAGNOSIS — I10 Essential (primary) hypertension: Secondary | ICD-10-CM | POA: Diagnosis not present

## 2016-12-16 LAB — BASIC METABOLIC PANEL
Anion gap: 12 (ref 5–15)
BUN: 24 mg/dL — AB (ref 6–20)
CO2: 26 mmol/L (ref 22–32)
CREATININE: 0.65 mg/dL (ref 0.44–1.00)
Calcium: 8.7 mg/dL — ABNORMAL LOW (ref 8.9–10.3)
Chloride: 108 mmol/L (ref 101–111)
GFR calc Af Amer: >60 mL/min (ref >60)
GFR calc non Af Amer: >60 mL/min (ref >60)
Glucose, Bld: 158 mg/dL — ABNORMAL HIGH (ref 65–99)
Potassium: 2.6 mmol/L — CL (ref 3.5–5.1)
SODIUM: 146 mmol/L — AB (ref 135–145)

## 2016-12-16 MED ORDER — HYDRALAZINE HCL 25 MG PO TABS: 25.0000 mg | ORAL_TABLET | Freq: Four times a day (QID) | ORAL | Status: DC | PRN

## 2016-12-16 MED ORDER — SODIUM CHLORIDE 0.9 % IV SOLN: 30.0000 meq | Freq: Once | INTRAVENOUS | Status: DC

## 2016-12-16 MED ORDER — POTASSIUM CHLORIDE 10 MEQ/100ML IV SOLN
10.0000 meq | INTRAVENOUS | Status: AC
  Administered 2016-12-16 (×2): 10 meq via INTRAVENOUS
  Filled 2016-12-16 (×3): qty 100

## 2016-12-16 MED ORDER — PNEUMOCOCCAL VAC POLYVALENT 25 MCG/0.5ML IJ INJ
0.5000 mL | INJECTION | INTRAMUSCULAR | Status: AC
  Administered 2016-12-17: 0.5 mL via INTRAMUSCULAR
  Filled 2016-12-16 (×2): qty 0.5

## 2016-12-16 MED ORDER — POTASSIUM CHLORIDE 10 MEQ/100ML IV SOLN
10.0000 meq | Freq: Once | INTRAVENOUS | Status: AC
  Administered 2016-12-16: 10 meq via INTRAVENOUS
  Filled 2016-12-16: qty 100

## 2016-12-16 MED ORDER — OXYCODONE-ACETAMINOPHEN 5-325 MG PO TABS: 1.0000 | ORAL_TABLET | Freq: Four times a day (QID) | ORAL | Status: DC | PRN

## 2016-12-16 MED ORDER — INFLUENZA VAC SPLIT QUAD 0.5 ML IM SUSY
0.5000 mL | PREFILLED_SYRINGE | INTRAMUSCULAR | Status: AC
  Administered 2016-12-17: 0.5 mL via INTRAMUSCULAR
  Filled 2016-12-16: qty 0.5

## 2016-12-16 NOTE — Progress Notes (Signed)
[facility]  PROGRESS NOTE  Loretta DibbleCarol A [name] ZOX:[id number] DOB: 1946-10-15 DOA: 12/15/2016 PCP: Michiel SitesKOHUT,Loretta DENNIS, MD   Brief HPI:    70 y.o. female with medical history significant of DM, HTN, obesity.  Patient presents to the ED with c/o being found down in bathroom by Son who called 911.  Patient fell 2x: first time 5 days ago and then again 4 days ago.  Couldn't get up after second fall.  Remained on floor until son found her today and called 911.  Has R leg pain from knee distally.    Subjective   Patient denies pain this morning, has splint in place in right leg.   Assessment/Plan:     1. Bimalleolar ankle fracture- Dr. Ophelia CharterYates was consulted by ED physician who recommended posterior splint and walker. PT evaluation done and skilled nursing facility recommended.Called and discussed with Dr Ophelia CharterYates, patient should be NWB in right leg. 2. Hypokalemia- hold HCTZ, will replace potassium 10 mg KCl IV 3. Check BMP in a.m. 3. Diabetes mellitus- will hold Metformin, Invokana. Will start sliding scale insulin with Novolog. 4. Hypertension- will hold HCTZ, start hydralazine 25 mg po Q 6h prn. Continue Avapro, Bystolic. 5. Hypernatremia- secondary to dehydration, will continue with normal saline at 75 ml/hr.    DVT prophylaxis: Lovenox  Code Status: Full code  Family Communication: No family at bedside  Disposition Plan: To be decided   Consultants:  None   Procedures:  None   Continuous infusions . sodium chloride Stopped (12/16/16 [id number])      Antibiotics:   Anti-infectives    None       Objective   Vitals:   12/15/16 2237 12/15/16 2349 12/16/16 0529 12/16/16 1345  BP: 154/68 (!) 149/74 (!) 159/63 (!) 126/46  Pulse: 82 79 67 60  Resp: 18 20 18 18   Temp:  98.1 F (36.7 C) 98.9 F (37.2 C) 98.5 F (36.9 C)  TempSrc:  Oral Oral Oral  SpO2: 99% 92% 92% 93%    Intake/Output Summary (Last 24 hours) at 12/16/16 1349 Last data filed at 12/16/16 0730  Gross per 24 hour  Intake              720 ml  Output                0 ml  Net              720 ml   There were no vitals filed for this visit.   Physical Examination:  General exam: Appears calm and comfortable. Respiratory system: Clear to auscultation. Respiratory effort normal. Cardiovascular system:  RRR. No  murmurs, rubs, gallops. No pedal edema. GI system: Abdomen is nondistended, soft and nontender. No organomegaly.  Central nervous system. No focal neurological deficits. 5 x 5 power in all extremities. Skin: No rashes, lesions or ulcers. Psychiatry: Alert, oriented x 3.Judgement and insight appear normal. Affect normal. Musculoskeletal- right ankle in splint    Data Reviewed: I have personally reviewed following labs and imaging studies  CBG: No results for input(s): GLUCAP in the last 168 hours.  CBC:  Recent Labs Lab 12/15/16 1651  WBC 15.8*  NEUTROABS 12.0*  HGB 16.9*  HCT 49.6*  MCV 87.2  PLT 210    Basic Metabolic Panel:  Recent Labs Lab 12/15/16 1651 12/16/16 0521  NA 147* 146*  K 3.1* 2.6*  CL 108 108  CO2 29 26  GLUCOSE 154* 158*  BUN 23* 24*  CREATININE 0.77 0.65  CALCIUM 9.2 8.7*    No results found for this or any previous visit (from the past 240 hour(s)).   Liver Function Tests: No results for input(s): AST, ALT, ALKPHOS, BILITOT, PROT, ALBUMIN in the last 168 hours. No results for input(s): LIPASE, AMYLASE in the last 168 hours. No results for input(s): AMMONIA in the last 168 hours.  Cardiac Enzymes:  Recent Labs Lab 12/15/16 1651  CKTOTAL 161   BNP (last 3 results) No results for input(s): BNP in the last 8760 hours.  ProBNP (last 3 results) No results for input(s): PROBNP in the last 8760 hours.    Studies: Dg Ankle Complete Right  Result Date: 12/15/2016 CLINICAL DATA:  Fall on December 22, unable to bear weight. The patient had a second fall on 12/23. EXAM: RIGHT ANKLE - COMPLETE 3+ VIEW COMPARISON:  None.  FINDINGS: Lateral malleolus fracture and nondisplaced fracture the posterior malleolus. Tibiotalar distance maintained common no visible fracture of the medial malleolus. Plantar and Achilles calcaneal spurs. IMPRESSION: 1. Stage III Weber B fracture with involvement of the lateral malleolus and posterior malleolus. Electronically Signed   By: Loretta RongWalter  [name] M.D.   On: 12/15/2016 18:12   Dg Knee Complete 4 Views Right  Result Date: 12/15/2016 CLINICAL DATA:  Falls in early December, knee pain. EXAM: RIGHT KNEE - COMPLETE 4+ VIEW COMPARISON:  None. FINDINGS: Prominent tricompartmental spurring. Suspected 2.0 by 0.7 by 1.1 cm free osteochondral fragment in the intercondylar notch anteriorly. Suspected small knee effusion in the suprapatellar bursa. No acute fracture in the knee is identified. IMPRESSION: 1. Well corticated osteochondral free fragment centrally in the knee joint. 2. Prominent tricompartmental spurring.  Osteoarthritis. 3. Suspected knee effusion. Electronically Signed   By: Loretta RongWalter  [name] M.D.   On: 12/15/2016 18:13    Scheduled Meds: . amLODipine  5 mg Oral Daily  . aspirin EC  81 mg Oral Daily  . atorvastatin  20 mg Oral Daily  . canagliflozin  300 mg Oral QAC breakfast  . enoxaparin (LOVENOX) injection  40 mg Subcutaneous QHS  . [START ON 12/17/2016] Influenza vac split quadrivalent PF  0.5 mL Intramuscular Tomorrow-1000  . irbesartan  300 mg Oral Daily  . multivitamin with minerals  1 tablet Oral Daily  . nebivolol  10 mg Oral Daily  . oxyCODONE-acetaminophen  1 tablet Oral Once  . [START ON 12/17/2016] pneumococcal 23 valent vaccine  0.5 mL Intramuscular Tomorrow-1000  . potassium chloride  10 mEq Intravenous Q1 Hr x 3  . potassium chloride  10 mEq Intravenous Once  . sodium chloride  500 mL Intravenous Once      Time spent: 25 min  [facility],Loretta [name] S   [facility] Pager [phone]. If 7PM-7AM, please contact night-coverage at [email or web address], Office   [phone]  password [email or web address] 12/16/2016, 1:49 PM  LOS: 0 days

## 2016-12-16 NOTE — Progress Notes (Signed)
   12/16/16 1445  What Happened  Was fall witnessed? Yes  Who witnessed fall? C Naydeline Morace RN  Patients activity before fall to/from bed, chair, or stretcher  Point of contact buttocks  Was patient injured? No  Follow Up  MD notified (Dr Sharl MaLama)  Time MD notified [phone]  Family notified Yes-comment  Time family notified 1445  Additional tests No  Progress note created (see row info) Yes

## 2016-12-16 NOTE — Progress Notes (Signed)
Plan for SNF at d/c, discharge planning per CSW. [phone]

## 2016-12-16 NOTE — Progress Notes (Signed)
Pt assisted to bed from bedside commode by C Berdell Hostetler RN.  Pt sat on side of bed and began to slide out of bed. RN held onto pt and called for help.  Dahlia ClientHannah RN and Advertising account plannersther RN arrived, however we were unable to lift pt from sliding and made clinical decision to sit pt on floor to prevent injury.  No injury noted. Pt states not injury.  Pt lifted by staff and pt son back to bed.  Lama MD aware and no further orders given.

## 2016-12-16 NOTE — Progress Notes (Signed)
Spiritual care responding to RN referral, pt request for advance directive.    Notarized advance directive with pt.  Pt with original and copies.  Copies in chart and with medical record to be scanned into EPIC.

## 2016-12-16 NOTE — NC FL2 (Signed)
[place] MEDICAID FL2 LEVEL OF CARE SCREENING TOOL     IDENTIFICATION  Patient Name: Loretta DibbleCarol A [name] Birthdate: 1946-01-17 Sex: female Admission Date (Current Location): 12/15/2016  [facility] [place]   Facility and Address[facility]  [facility],  [address], [place] [id number]      Provider Number: [id number]  Attending Physician Name and Address:  Meredeth IdeGagan S Lama, MD  Relative Name and Phone Number:       Current Level of Care: Hospital Recommended Level of Care: Skilled Nursing Facility Prior Approval Number:    Date Approved/Denied:   PASRR Number: [id number]  Discharge Plan: SNF    Current Diagnoses: Patient Active Problem List   Diagnosis Date Noted  . Bimalleolar ankle fracture 12/15/2016  . DM2 (diabetes mellitus, type 2) (HCC) 12/15/2016  . HTN (hypertension) 12/15/2016  . Hypokalemia 12/15/2016  . Hypernatremia 12/15/2016    Orientation RESPIRATION BLADDER Height & Weight     Self, Time, Situation, Place  Normal Continent Weight:   Height:     BEHAVIORAL SYMPTOMS/MOOD NEUROLOGICAL BOWEL NUTRITION STATUS      Continent Diet  AMBULATORY STATUS COMMUNICATION OF NEEDS Skin   Extensive Assist Verbally  (ankle fracture. )                       Personal Care Assistance Level of Assistance  Bathing, Feeding, Dressing Bathing Assistance: Limited assistance Feeding assistance: Independent Dressing Assistance: Limited assistance     Functional Limitations Info  Sight, Hearing, Speech Sight Info: Impaired Hearing Info: Adequate Speech Info: Adequate    SPECIAL CARE FACTORS FREQUENCY  PT (By licensed PT), OT (By licensed OT)     PT Frequency: 5 OT Frequency: 5            Contractures      Additional Factors Info  Code Status, Allergies Code Status Info: Full Allergies Info: Pravastatin           Current Medications (12/16/2016):  This is the current hospital active medication list Current  Facility-Administered Medications  Medication Dose Route Frequency Provider Last Rate Last Dose  . 0.9 %  sodium chloride infusion   Intravenous Continuous Hillary BowJared M Gardner, DO   Stopped at 12/16/16 [phone]  . amLODipine (NORVASC) tablet 5 mg  5 mg Oral Daily Hillary BowJared M Gardner, DO   5 mg at 12/16/16 0954  . aspirin EC tablet 81 mg  81 mg Oral Daily Hillary BowJared M Gardner, DO   81 mg at 12/16/16 [phone]  . atorvastatin (LIPITOR) tablet 20 mg  20 mg Oral Daily Hillary BowJared M Gardner, DO   20 mg at 12/16/16 0954  . canagliflozin [facility](INVOKANA) tablet 300 mg  300 mg Oral QAC breakfast Hillary BowJared M Gardner, DO   300 mg at 12/16/16 0735  . enoxaparin (LOVENOX) injection 40 mg  40 mg Subcutaneous QHS Hillary BowJared M Gardner, DO   40 mg at 12/15/16 2348  . hydrALAZINE (APRESOLINE) tablet 25 mg  25 mg Oral Q6H PRN Meredeth IdeGagan S Lama, MD      . ibuprofen (ADVIL,MOTRIN) tablet 400 mg  400 mg Oral Q6H PRN Hillary BowJared M Gardner, DO      . [START ON 12/17/2016] Influenza vac split quadrivalent PF (FLUARIX) injection 0.5 mL  0.5 mL Intramuscular Tomorrow-1000 Hillary BowJared M Gardner, DO      . irbesartan (AVAPRO) tablet 300 mg  300 mg Oral Daily Hillary BowJared M Gardner, DO   300 mg at 12/16/16 0954  . multivitamin  with minerals tablet 1 tablet  1 tablet Oral Daily Hillary BowJared M Gardner, DO   1 tablet at 12/16/16 [phone]  . nebivolol (BYSTOLIC) tablet 10 mg  10 mg Oral Daily Hillary BowJared M Gardner, DO   10 mg at 12/16/16 0954  . oxyCODONE-acetaminophen (PERCOCET/ROXICET) 5-325 MG per tablet 1 tablet  1 tablet Oral Once Cheri FowlerKayla Rose, PA-C      . [START ON 12/17/2016] pneumococcal 23 valent vaccine (PNU-IMMUNE) injection 0.5 mL  0.5 mL Intramuscular Tomorrow-1000 Jared M Gardner, DO      . sodium chloride 0.9 % bolus 500 mL  500 mL Intravenous Once Cheri FowlerKayla Rose, PA-C         Discharge Medications: Please see discharge summary for a list of discharge medications.  Relevant Imaging Results:  Relevant Lab Results:   Additional Information ssn:[id number]  Clearance CootsNicole A Jebidiah Baggerly, LCSW

## 2016-12-16 NOTE — Evaluation (Signed)
Physical Therapy Evaluation Patient Details Name: Loretta [name] MRN: [id number] DOB: 08/18/1946 Today's Date: 12/16/2016   History of Present Illness  Loretta [name] is a 70 y.o. female with PMH significant for DM, HLD, and HTN who presents by EMS from home for evaluation of fall 5 days ago.  Patient states she was walking in the bathroom and slipped on water on the floor causing her ankle to roll and fall backwards.  She states she was unable to get up after falling and has been lying on the ground since her fall.  She lives at home alone and was not able to contact anyone for help.  Her son had not heard from her in a couple of days and contacts the police to investigate who then found her lying on the floor.  She had difficulty ambulating with EMS due to right ankle pain.   Clinical Impression  Pt admitted with above diagnosis. Pt currently with functional limitations due to the deficits listed below (see PT Problem List). Pt will benefit from skilled PT to increase their independence and safety with mobility to allow discharge to the venue listed below.  Pt with PT orders to see pt ASAP, as well as ortho orders.  No WB status in chart at this time and educated pt on NWB status until clarification. Pt able to maintain in static standing, but not with SPT.  Pt lives alone in 2 level home with bedroom on 2nd floor. Recommend SNF for rehab.     Follow Up Recommendations SNF    Equipment Recommendations  None recommended by PT (TBD at next facility)    Recommendations for Other Services       Precautions / Restrictions Restrictions Other Position/Activity Restrictions: Awaiting clarification, but instructed in NWB R LE      Mobility  Bed Mobility Overal bed mobility: Modified Independent             General bed mobility comments: Pt able to go from sitting EOB to supine to sitting on other side of bed  Transfers Overall transfer level: Needs assistance Equipment used:  Rolling walker (2 wheeled) Transfers: Sit to/from [employer] CorporationStand;Stand Pivot Transfers Sit to Stand: Min assist;From elevated surface Stand pivot transfers: Min assist       General transfer comment: MIN A with repetitive cueing for hand placement and WB status.  In static standing, pt able to maintain R LE NWB, but needed heavy cueing with SPT and pt was unable to maintain NWB status with SPT despite instruction  Ambulation/Gait                Stairs            Wheelchair Mobility    Modified Rankin (Stroke Patients Only)       Balance Overall balance assessment: History of Falls                                           Pertinent Vitals/Pain Pain Assessment: No/denies pain    Home Living Family/patient expects to be discharged to:: Skilled nursing facility Living Arrangements: Alone               Additional Comments: 3 steps to enter 2 level home with bedroom on 2nd floor    Prior Function  Hand Dominance        Extremity/Trunk Assessment   Upper Extremity Assessment Upper Extremity Assessment: Overall WFL for tasks assessed    Lower Extremity Assessment Lower Extremity Assessment: RLE deficits/detail RLE Deficits / Details: short leg cast       Communication   Communication: No difficulties  Cognition Arousal/Alertness: Awake/alert Behavior During Therapy: WFL for tasks assessed/performed Overall Cognitive Status: Within Functional Limits for tasks assessed Area of Impairment: Memory;Problem solving     Memory: Decreased short-term memory       Problem Solving: Slow processing General Comments: Pt oriented, but slow processing at times.  Needed to repeat instructions    General Comments      Exercises     Assessment/Plan    PT Assessment Patient needs continued PT services  PT Problem List Decreased strength;Decreased activity tolerance;Decreased balance;Decreased mobility;Decreased  knowledge of precautions;Decreased knowledge of use of DME          PT Treatment Interventions DME instruction;Gait training;Functional mobility training;Therapeutic activities;Therapeutic exercise;Balance training    PT Goals (Current goals can be found in the Care Plan section)  Acute Rehab PT Goals Patient Stated Goal: get a bath PT Goal Formulation: With patient Time For Goal Achievement: 12/30/16 Potential to Achieve Goals: Good    Frequency Min 3X/week   Barriers to discharge Inaccessible home environment;Decreased caregiver support      Co-evaluation               End of Session Equipment Utilized During Treatment: Gait belt Activity Tolerance: Patient tolerated treatment well Patient left: in chair;with call bell/phone within reach;with chair alarm set Nurse Communication: Mobility status;Weight bearing status    Functional Assessment Tool Used: clinical judgement and objective findings Functional Limitation: Changing and maintaining body position Changing and Maintaining Body Position Current Status (Z6109(G8981): At least 1 percent but less than 20 percent impaired, limited or restricted Changing and Maintaining Body Position Goal Status (U0454(G8982): At least 1 percent but less than 20 percent impaired, limited or restricted    Time: [id number] PT Time Calculation (min) (ACUTE ONLY): 17 min   Charges:   PT Evaluation $PT Eval Moderate Complexity: 1 Procedure     PT G Codes:   PT G-Codes **NOT FOR INPATIENT CLASS** Functional Assessment Tool Used: clinical judgement and objective findings Functional Limitation: Changing and maintaining body position Changing and Maintaining Body Position Current Status (N8295(G8981): At least 1 percent but less than 20 percent impaired, limited or restricted Changing and Maintaining Body Position Goal Status (A2130(G8982): At least 1 percent but less than 20 percent impaired, limited or restricted    [facility] Medical CenterMITH,Jamone Garrido LUBECK 12/16/2016, 9:30  AM

## 2016-12-16 NOTE — Clinical Social Work Note (Addendum)
Clinical Social Work Assessment  Patient Details  Name: Loretta [name] MRN: [id number] Date of Birth: 1946-02-14  Date of referral:  12/16/16               Reason for consult:  Facility Placement                Permission sought to share information with:  Facility Sport and exercise psychologist, Family Supports Permission granted to share information::     Name::      Alnisa Hasley  Agency::   SNF  Relationship::  Son  Contact Information:   [phone]  Housing/Transportation Living arrangements for the past 2 months:  Single Family Home Source of Information:  Patient Patient Interpreter Needed:  None Criminal Activity/Legal Involvement Pertinent to Current Situation/Hospitalization:  No - Comment as needed Significant Relationships:  Adult Children Lives with:  Self Do you feel safe going back to the place where you live?  No Need for family participation in patient care:  Yes (Comment)  Care giving concerns: Patient is concern she cannot manage at home, since she has been told no weight on ankle at this time.    Social Worker assessment / plan: LCSWA met with patient at bedside. LCSWA explained plan to assist with SNF placement. Patient reports this her first experience with rehab and physical therapy. LCSWA explained the faxing out process. Patient reports she would like a facility close to her home if possible.  Plan: Complete FL2, PASSR  Employment status:  Retired Nurse, adult PT Recommendations:  [place] / Referral to community resources:     Patient/Family's Response to care:  Patient reports son wants her to go facility as he is concern about her ability to care for self at home alone. She reports her son lives in [place], [place].   Patient/Family's Understanding of and Emotional Response to Diagnosis, Current Treatment, and Prognosis:  Patient reports she is learning about her treatment and asking questions for  clarification.   Emotional Assessment Appearance:    Attitude/Demeanor/Rapport:    Affect (typically observed):  Pleasant, Calm, Accepting Orientation:  Oriented to Self, Oriented to Place, Oriented to  Time, Oriented to Situation Alcohol / Substance use:  Not Applicable Psych involvement (Current and /or in the community):  No (Comment)  Discharge Needs  Concerns to be addressed:  Care Coordination, Discharge Planning Concerns Readmission within the last 30 days:  No Current discharge risk:  Dependent with Mobility Barriers to Discharge:  Continued Medical Work up   [employer], LCSW 12/16/2016, 11:34 AM

## 2016-12-17 DIAGNOSIS — I10 Essential (primary) hypertension: Secondary | ICD-10-CM

## 2016-12-17 DIAGNOSIS — Z7984 Long term (current) use of oral hypoglycemic drugs: Secondary | ICD-10-CM | POA: Diagnosis not present

## 2016-12-17 DIAGNOSIS — R2681 Unsteadiness on feet: Secondary | ICD-10-CM | POA: Diagnosis not present

## 2016-12-17 DIAGNOSIS — W19XXXA Unspecified fall, initial encounter: Secondary | ICD-10-CM | POA: Diagnosis not present

## 2016-12-17 DIAGNOSIS — R296 Repeated falls: Secondary | ICD-10-CM | POA: Diagnosis not present

## 2016-12-17 DIAGNOSIS — M1990 Unspecified osteoarthritis, unspecified site: Secondary | ICD-10-CM | POA: Diagnosis not present

## 2016-12-17 DIAGNOSIS — Z23 Encounter for immunization: Secondary | ICD-10-CM | POA: Diagnosis not present

## 2016-12-17 DIAGNOSIS — S8261XD Displaced fracture of lateral malleolus of right fibula, subsequent encounter for closed fracture with routine healing: Secondary | ICD-10-CM | POA: Diagnosis not present

## 2016-12-17 DIAGNOSIS — Z4789 Encounter for other orthopedic aftercare: Secondary | ICD-10-CM | POA: Diagnosis not present

## 2016-12-17 DIAGNOSIS — Z8262 Family history of osteoporosis: Secondary | ICD-10-CM | POA: Diagnosis not present

## 2016-12-17 DIAGNOSIS — R278 Other lack of coordination: Secondary | ICD-10-CM | POA: Diagnosis not present

## 2016-12-17 DIAGNOSIS — E119 Type 2 diabetes mellitus without complications: Secondary | ICD-10-CM | POA: Diagnosis not present

## 2016-12-17 DIAGNOSIS — M25571 Pain in right ankle and joints of right foot: Secondary | ICD-10-CM | POA: Diagnosis present

## 2016-12-17 DIAGNOSIS — Z888 Allergy status to other drugs, medicaments and biological substances status: Secondary | ICD-10-CM | POA: Diagnosis not present

## 2016-12-17 DIAGNOSIS — R531 Weakness: Secondary | ICD-10-CM | POA: Diagnosis not present

## 2016-12-17 DIAGNOSIS — N390 Urinary tract infection, site not specified: Secondary | ICD-10-CM | POA: Diagnosis not present

## 2016-12-17 DIAGNOSIS — S8261XA Displaced fracture of lateral malleolus of right fibula, initial encounter for closed fracture: Secondary | ICD-10-CM | POA: Diagnosis not present

## 2016-12-17 DIAGNOSIS — E860 Dehydration: Secondary | ICD-10-CM | POA: Diagnosis not present

## 2016-12-17 DIAGNOSIS — Z7982 Long term (current) use of aspirin: Secondary | ICD-10-CM | POA: Diagnosis not present

## 2016-12-17 DIAGNOSIS — R279 Unspecified lack of coordination: Secondary | ICD-10-CM | POA: Diagnosis not present

## 2016-12-17 DIAGNOSIS — E870 Hyperosmolality and hypernatremia: Secondary | ICD-10-CM | POA: Diagnosis not present

## 2016-12-17 DIAGNOSIS — M1711 Unilateral primary osteoarthritis, right knee: Secondary | ICD-10-CM | POA: Diagnosis not present

## 2016-12-17 DIAGNOSIS — E785 Hyperlipidemia, unspecified: Secondary | ICD-10-CM | POA: Diagnosis not present

## 2016-12-17 DIAGNOSIS — S82841A Displaced bimalleolar fracture of right lower leg, initial encounter for closed fracture: Secondary | ICD-10-CM | POA: Diagnosis not present

## 2016-12-17 DIAGNOSIS — Z79899 Other long term (current) drug therapy: Secondary | ICD-10-CM | POA: Diagnosis not present

## 2016-12-17 DIAGNOSIS — S92909A Unspecified fracture of unspecified foot, initial encounter for closed fracture: Secondary | ICD-10-CM | POA: Diagnosis not present

## 2016-12-17 DIAGNOSIS — S82841D Displaced bimalleolar fracture of right lower leg, subsequent encounter for closed fracture with routine healing: Secondary | ICD-10-CM | POA: Diagnosis not present

## 2016-12-17 DIAGNOSIS — S82891D Other fracture of right lower leg, subsequent encounter for closed fracture with routine healing: Secondary | ICD-10-CM | POA: Diagnosis not present

## 2016-12-17 DIAGNOSIS — E876 Hypokalemia: Secondary | ICD-10-CM | POA: Diagnosis not present

## 2016-12-17 DIAGNOSIS — M6281 Muscle weakness (generalized): Secondary | ICD-10-CM | POA: Diagnosis not present

## 2016-12-17 DIAGNOSIS — Z6838 Body mass index (BMI) 38.0-38.9, adult: Secondary | ICD-10-CM | POA: Diagnosis not present

## 2016-12-17 DIAGNOSIS — Z8489 Family history of other specified conditions: Secondary | ICD-10-CM | POA: Diagnosis not present

## 2016-12-17 DIAGNOSIS — M79661 Pain in right lower leg: Secondary | ICD-10-CM | POA: Diagnosis not present

## 2016-12-17 DIAGNOSIS — E669 Obesity, unspecified: Secondary | ICD-10-CM | POA: Diagnosis not present

## 2016-12-17 LAB — BASIC METABOLIC PANEL
Anion gap: 10 (ref 5–15)
BUN: 20 mg/dL (ref 6–20)
CHLORIDE: 108 mmol/L (ref 101–111)
CO2: 24 mmol/L (ref 22–32)
Calcium: 8.4 mg/dL — ABNORMAL LOW (ref 8.9–10.3)
Creatinine, Ser: 0.7 mg/dL (ref 0.44–1.00)
GFR calc Af Amer: >60 mL/min (ref >60)
GFR calc non Af Amer: >60 mL/min (ref >60)
Glucose, Bld: 126 mg/dL — ABNORMAL HIGH (ref 65–99)
POTASSIUM: 3.2 mmol/L — AB (ref 3.5–5.1)
SODIUM: 142 mmol/L (ref 135–145)

## 2016-12-17 LAB — GLUCOSE, CAPILLARY
GLUCOSE-CAPILLARY: 132 mg/dL — AB (ref 65–99)
GLUCOSE-CAPILLARY: 141 mg/dL — AB (ref 65–99)

## 2016-12-17 LAB — POTASSIUM: POTASSIUM: 3.5 mmol/L (ref 3.5–5.1)

## 2016-12-17 MED ORDER — POTASSIUM CHLORIDE 10 MEQ/100ML IV SOLN
10.0000 meq | Freq: Once | INTRAVENOUS | Status: AC
  Administered 2016-12-17: 10 meq via INTRAVENOUS
  Filled 2016-12-17: qty 100

## 2016-12-17 MED ORDER — OXYCODONE-ACETAMINOPHEN 5-325 MG PO TABS: 1.0000 | ORAL_TABLET | Freq: Four times a day (QID) | ORAL | 0 refills | Status: DC | PRN

## 2016-12-17 MED ORDER — LIP MEDEX EX OINT
TOPICAL_OINTMENT | CUTANEOUS | Status: AC
  Administered 2016-12-17: 1
  Filled 2016-12-17: qty 7

## 2016-12-17 MED ORDER — INSULIN ASPART 100 UNIT/ML ~~LOC~~ SOLN
0.0000 [IU] | Freq: Three times a day (TID) | SUBCUTANEOUS | Status: DC
  Administered 2016-12-17: 1 [IU] via SUBCUTANEOUS

## 2016-12-17 MED ORDER — POTASSIUM CHLORIDE CRYS ER 20 MEQ PO TBCR: 40.0000 meq | EXTENDED_RELEASE_TABLET | Freq: Once | ORAL | Status: DC

## 2016-12-17 MED ORDER — POTASSIUM CHLORIDE CRYS ER 20 MEQ PO TBCR
40.0000 meq | EXTENDED_RELEASE_TABLET | ORAL | Status: AC
  Administered 2016-12-17 (×2): 40 meq via ORAL
  Filled 2016-12-17 (×2): qty 2

## 2016-12-17 MED ORDER — POTASSIUM CHLORIDE 10 MEQ/100ML IV SOLN
10.0000 meq | INTRAVENOUS | Status: DC
  Filled 2016-12-17 (×3): qty 100

## 2016-12-17 NOTE — Progress Notes (Signed)
Physical Therapy Treatment Patient Details Name: Loretta DibbleCarol A [name] MRN: [id number] DOB: 1946-02-29 Today's Date: 12/17/2016    History of Present Illness Loretta DibbleCarol A [name] is a 70 y.o. female with PMH significant for DM, HLD, and HTN who presents by EMS from home for evaluation of fall 5 days ago.  Patient states she was walking in the bathroom and slipped on water on the floor causing her ankle to roll and fall backwards.  She states she was unable to get up after falling and has been lying on the ground since her fall.  She lives at home alone and was not able to contact anyone for help.  Her son had not heard from her in a couple of days and contacts the police to investigate who then found her lying on the floor.  She had difficulty ambulating with EMS due to right ankle pain.     PT Comments    Pt needs heavy cueing for hand placement with transfers.  Pt able to maintain NWB R LE with transfers today with cueing and has to "shimmy" L foot rather than hop.  Pt would benefit from SNF.  Follow Up Recommendations  SNF     Equipment Recommendations  None recommended by PT    Recommendations for Other Services       Precautions / Restrictions Precautions Precautions: Fall Restrictions Weight Bearing Restrictions: Yes RLE Weight Bearing: Non weight bearing    Mobility  Bed Mobility Overal bed mobility: Modified Independent                Transfers Overall transfer level: Needs assistance Equipment used: Rolling walker (2 wheeled) Transfers: Sit to/from BJ'sStand;Stand Pivot Transfers Sit to Stand: Min assist;Mod assist;From elevated surface Stand pivot transfers: Min assist       General transfer comment: MIN A from elevated surface and cueing for hand placement with pt not receptive.  MOD A from lower surface with improved use of hands.  With SPT, pt shimmied L foot over rather than hopping.  Ambulation/Gait                 Stairs            Wheelchair  Mobility    Modified Rankin (Stroke Patients Only)       Balance Overall balance assessment: History of Falls                                  Cognition Arousal/Alertness: Awake/alert Behavior During Therapy: WFL for tasks assessed/performed Overall Cognitive Status: Within Functional Limits for tasks assessed                      Exercises Other Exercises Other Exercises: chair push-ups    General Comments        Pertinent Vitals/Pain Pain Assessment: No/denies pain    Home Living                      Prior Function            PT Goals (current goals can now be found in the care plan section) Acute Rehab PT Goals PT Goal Formulation: With patient Time For Goal Achievement: 12/30/16 Potential to Achieve Goals: Good Progress towards PT goals: Progressing toward goals    Frequency    Min 3X/week      PT Plan Current plan remains appropriate  Co-evaluation             End of Session Equipment Utilized During Treatment: Gait belt Activity Tolerance: Patient tolerated treatment well Patient left: in chair;with call bell/phone within reach;with chair alarm set     Time: [phone] [id number] (no charge for time pt on [facility] [phone]) PT Time Calculation (min) (ACUTE ONLY): 47 min  Charges:  $Therapeutic Activity: 23-37 mins                    G Codes:      Loretta [name] 12/17/2016, 12:24 PM

## 2016-12-17 NOTE — Clinical Social Work Placement (Signed)
   CLINICAL SOCIAL WORK PLACEMENT  NOTE  Date:  12/17/2016  Patient Details  Name: Loretta [name] MRN: [id number] Date of Birth: 01-Sep-1946  Clinical Social Work is seeking post-discharge placement for this patient at the Skilled  Nursing Facility level of care (*CSW will initial, date and re-position this form in  chart as items are completed):  Yes   Patient/family provided with [place] Clinical Social Work Department's list of facilities offering this level of care within the geographic area requested by the patient (or if unable, by the patient's family).  Yes   Patient/family informed of their freedom to choose among providers that offer the needed level of care, that participate in Medicare, Medicaid or managed care program needed by the patient, have an available bed and are willing to accept the patient.  Yes   Patient/family informed of [facility]'s ownership interest in [facility] and [facility], as well as of the fact that they are under no obligation to receive care at these facilities.  PASRR submitted to EDS on       PASRR number received on       Existing PASRR number confirmed on 12/17/16     FL2 transmitted to all facilities in geographic area requested by pt/family on       FL2 transmitted to all facilities within larger geographic area on 12/16/16     Patient informed that his/her managed care company has contracts with or will negotiate with certain facilities, including the following:  [facility] [place]     Yes   Patient/family informed of bed offers received.  Patient chooses bed at [facility] [place]     Physician recommends and patient chooses bed at [facility] [place]    Patient to be transferred to [facility] [place] on 12/17/16.  Patient to be transferred to facility by [facility]     Patient family notified on 12/17/16 of transfer.  Name of family member notified:  Son: [name]     PHYSICIAN Please prepare priority  discharge summary, including medications     Additional Comment:    _______________________________________________ Clearance CootsNicole A Micaylah Bertucci, LCSW 12/17/2016, 1:39 PM

## 2016-12-17 NOTE — Progress Notes (Signed)
Gave report to Fannie KneeSue, Charity fundraiserN at [facility]. Left number in case she has additional questions.

## 2016-12-17 NOTE — Discharge Summary (Signed)
Physician Discharge Summary  Loretta [name] AOZ:[id number] DOB: [birth year] DOA: 12/15/2016  PCP: Michiel SitesKOHUT,WALTER DENNIS, MD  Admit date: 12/15/2016 Discharge date: 12/17/2016  Time spent: 25* minutes  Recommendations for Outpatient Follow-up:  1. Follow up Dr Ophelia CharterYates in 1 week   Discharge Diagnoses:  Principal Problem:   Bimalleolar ankle fracture Active Problems:   DM2 (diabetes mellitus, type 2) (HCC)   HTN (hypertension)   Hypokalemia   Hypernatremia   Discharge Condition: Stable  Diet recommendation: Low salt diet  There were no vitals filed for this visit.  History of present illness:  70 y.o.femalewith medical history significant of DM, HTN, obesity. Patient presents to the ED with c/o being found down in bathroom by Son who called 911. Patient fell 2x: first time 5 days ago and then again 4 days ago. Couldn't get up after second fall. Remained on floor until son found her today and called 911. Has R leg pain from knee distally.   Hospital Course:  1. Bimalleolar ankle fracture- Dr. Ophelia CharterYates was consulted by ED physician who recommended posterior splint and walker. PT evaluation done and skilled nursing facility recommended.Called and discussed with Dr Ophelia CharterYates, patient should be NWB in right leg.Will follow up Dr Ophelia CharterYates as outpatient 2. Hypokalemia- potasium replaced, k is 3.5. 3. Diabetes mellitus- continue metformin, Dapagliflozin 4. Hypertension- continue benicar, Bystolic. 5. Hypernatremia-resolved, today sodium is 142  Procedures:  None  Consultations:  None  Discharge Exam: Vitals:   12/16/16 2124 12/17/16 0410  BP: (!) 156/64 (!) 166/63  Pulse: 66 60  Resp: 18 18  Temp: 99.1 F (37.3 C) 98.2 F (36.8 C)    General: Appears in no acute distress Cardiovascular: RRR, S1S2 Respiratory: Clear bilaterally  Discharge Instructions   Discharge Instructions    Diet - low sodium heart healthy    Complete by:  As directed    Increase activity slowly     Complete by:  As directed      Current Discharge Medication List    START taking these medications   Details  oxyCODONE-acetaminophen (PERCOCET/ROXICET) 5-325 MG tablet Take 1 tablet by mouth every 6 (six) hours as needed for severe pain. Qty: 30 tablet, Refills: 0      CONTINUE these medications which have NOT CHANGED   Details  amLODipine (NORVASC) 5 MG tablet Take 1 tablet by mouth daily.    aspirin EC 81 MG tablet Take 81 mg by mouth daily.    atorvastatin (LIPITOR) 20 MG tablet Take 1 tablet by mouth daily.    BYSTOLIC 10 MG tablet Take 1 tablet by mouth daily.    furosemide (LASIX) 40 MG tablet Take 1 tablet by mouth daily as needed for fluid.     Multiple Vitamin (MULTIVITAMIN WITH MINERALS) TABS tablet Take 1 tablet by mouth daily.    olmesartan-hydrochlorothiazide (BENICAR HCT) 40-25 MG tablet Take 1 tablet by mouth daily.    XIGDUO XR 09-999 MG TB24 Take 1 tablet by mouth daily.      STOP taking these medications     ibuprofen (ADVIL,MOTRIN) 200 MG tablet        Allergies  Allergen Reactions  . Pravastatin Other (See Comments)    Caused leg paralysis    Follow-up Information    Loretta MangesMark C Yates, MD. Call in 1 day.   Specialty:  Orthopedic Surgery Contact information: [address] [place] [address] [phone]            The results of significant diagnostics from this hospitalization (  including imaging, microbiology, ancillary and laboratory) are listed below for reference.    Significant Diagnostic Studies: Dg Ankle Complete Right  Result Date: 12/15/2016 CLINICAL DATA:  Fall on December 22, unable to bear weight. The patient had a second fall on 12/23. EXAM: RIGHT ANKLE - COMPLETE 3+ VIEW COMPARISON:  None. FINDINGS: Lateral malleolus fracture and nondisplaced fracture the posterior malleolus. Tibiotalar distance maintained common no visible fracture of the medial malleolus. Plantar and Achilles calcaneal spurs. IMPRESSION: 1. Stage  III Weber B fracture with involvement of the lateral malleolus and posterior malleolus. Electronically Signed   By: Gaylyn RongWalter  Liebkemann M.D.   On: 12/15/2016 18:12   Dg Knee Complete 4 Views Right  Result Date: 12/15/2016 CLINICAL DATA:  Falls in early December, knee pain. EXAM: RIGHT KNEE - COMPLETE 4+ VIEW COMPARISON:  None. FINDINGS: Prominent tricompartmental spurring. Suspected 2.0 by 0.7 by 1.1 cm free osteochondral fragment in the intercondylar notch anteriorly. Suspected small knee effusion in the suprapatellar bursa. No acute fracture in the knee is identified. IMPRESSION: 1. Well corticated osteochondral free fragment centrally in the knee joint. 2. Prominent tricompartmental spurring.  Osteoarthritis. 3. Suspected knee effusion. Electronically Signed   By: Gaylyn RongWalter  Liebkemann M.D.   On: 12/15/2016 18:13    Microbiology: No results found for this or any previous visit (from the past 240 hour(s)).   Labs: Basic Metabolic Panel:  Recent Labs Lab 12/15/16 1651 12/16/16 0521 12/17/16 0536 12/17/16 1420  NA 147* 146* 142  --   K 3.1* 2.6* 3.2* 3.5  CL 108 108 108  --   CO2 29 26 24   --   GLUCOSE 154* 158* 126*  --   BUN 23* 24* 20  --   CREATININE 0.77 0.65 0.70  --   CALCIUM 9.2 8.7* 8.4*  --    CBC:  Recent Labs Lab 12/15/16 1651  WBC 15.8*  NEUTROABS 12.0*  HGB 16.9*  HCT 49.6*  MCV 87.2  PLT 210   Cardiac Enzymes:  Recent Labs Lab 12/15/16 1651  CKTOTAL 161    CBG:  Recent Labs Lab 12/17/16 0754 12/17/16 1248  GLUCAP 132* 141*       Signed:  Mauro KaufmannLAMA,Omar Orrego S MD.  [facility] 12/17/2016, 2:54 PM

## 2016-12-17 NOTE — Care Management Note (Signed)
Case Management Note  Patient Details  Name: Loretta DibbleCarol A [name] MRN: [id number] Date of Birth: 11-07-1946  Subjective/Objective:   70 y.o. F admitted with fx ankle. Anticipate discharge to SNF today if K+ repletion is successful. Will receive IVPB dose and po dose prior to redraw of Serum K+at 1pm. Sharl MaLama, MD will discharge if WNL.                   Action/Plan: Discharge disposition deferred to CSW for SNF.    Expected Discharge Date:                  Expected Discharge Plan:  Skilled Nursing Facility  In-House Referral:  Clinical Social Work  Discharge planning Services  CM Consult  Post Acute Care Choice:  NA Choice offered to:  NA  DME Arranged:  N/A DME Agency:  NA  HH Arranged:  NA HH Agency:  NA  Status of Service:  Completed, signed off  If discussed at Long Length of Stay Meetings, dates discussed:    Additional Comments:  Yvone NeuCrutchfield, Eliane Hammersmith M, RN 12/17/2016, 9:46 AM

## 2016-12-17 NOTE — Progress Notes (Signed)
[facility] called for transport. One hour before pick up.  Nurse given report information Patient son updated about transport.   Vivi BarrackNicole Masen Salvas, Theresia MajorsLCSWA, MSW Clinical Social Worker 5E and Psychiatric Service Line [phone] 12/17/2016  3:23 PM

## 2016-12-17 NOTE — Care Management Obs Status (Signed)
MEDICARE OBSERVATION STATUS NOTIFICATION   Patient Details  Name: Loretta DibbleCarol A [name] MRN: [id number] Date of Birth: [birth year]   Medicare Observation Status Notification Given:  Yes    CrutchfieldDerrill Memo, Kaysen Sefcik M, RN 12/17/2016, 3:34 PM

## 2016-12-18 LAB — HEMOGLOBIN A1C
Hgb A1c MFr Bld: 6.7 % — ABNORMAL HIGH (ref 4.8–5.6)
Mean Plasma Glucose: 146 mg/dL

## 2016-12-25 ENCOUNTER — Encounter (INDEPENDENT_AMBULATORY_CARE_PROVIDER_SITE_OTHER): Payer: Self-pay | Admitting: Orthopaedic Surgery

## 2016-12-25 ENCOUNTER — Ambulatory Visit (INDEPENDENT_AMBULATORY_CARE_PROVIDER_SITE_OTHER): Payer: Medicare Other | Admitting: Orthopaedic Surgery

## 2016-12-25 VITALS — BP 133/84 | HR 62 | Ht 68.0 in | Wt 250.0 lb

## 2016-12-25 DIAGNOSIS — S8261XA Displaced fracture of lateral malleolus of right fibula, initial encounter for closed fracture: Secondary | ICD-10-CM

## 2016-12-25 NOTE — Progress Notes (Signed)
Office Visit Note   Patient: Loretta DibbleCarol A [name]           Date of Birth: [birth year]           MRN: [id number] Visit Date: 12/25/2016              Requested by: Darci NeedleWalter Kohut, MD [address] [place], [place] [id number] PCP: Michiel SitesKOHUT,WALTER DENNIS, MD   Assessment & Plan: Visit Diagnoses:  1. Closed displaced fracture of lateral malleolus of right fibula, initial encounter     Plan: After discussion with patient and informed consent will proceed with cast immobilization. She needs to return 1 week and continue to be nonweightbearing. I discussed with her that this fracture may shift and if it does she will require surgery to keep her ankle joint in acceptable alignment. She lives alone has a Nature conservation officertwo-story house and has one son in [place], [place]. Repeat x-rays on return in one week in the cast.  Follow-Up Instructions: No Follow-up on file.   Orders:  No orders of the defined types were placed in this encounter.  No orders of the defined types were placed in this encounter.     Procedures: No procedures performed   Clinical Data: No additional findings.   Subjective: Chief Complaint  Patient presents with  . Right Ankle - Fracture    Patient fell approximately 12/11/2016. She was found in the floor by her son 4 days later and taken by EMS [facility]. She was diagnosed with right bimalleolar ankle fracture. [facility] is staying [facility]. She has tried to be nonweightbearing as best she can. She states that she does not have pain unless she applies weight.    Review of Systems 14 (was performed positive for a some cognitive problems with memory loss. Type 2 diabetes, hypertension. Electrolyte disturbance on admission to the hospital from lying on the floor for several days before she was found. Negative for MI or stroke.   Objective: Vital Signs: BP 133/84   Pulse 62   Ht 5\' 8"  (1.727 m)   Wt 250 lb (113.4 kg)   BMI 38.01 kg/m   Physical Exam    Constitutional: She is oriented to person, place, and time. She appears well-developed.  HENT:  Head: Normocephalic.  Right Ear: External ear normal.  Left Ear: External ear normal.  Eyes: Pupils are equal, round, and reactive to light.  Neck: No tracheal deviation present. No thyromegaly present.  Cardiovascular: Normal rate.   Pulmonary/Chest: Effort normal.  Abdominal: Soft.  Musculoskeletal:  Patient's in a short-leg splint splint is removed check skin is not blistering mild ecchymosis.  Neurological: She is alert and oriented to person, place, and time.  Skin: Skin is warm and dry.  Psychiatric: She has a normal mood and affect. Her behavior is normal.  Patient can relate were sons are located where they work she slowly answers questions. Oriented 3.    Ortho Exam pulses are intact. Good knee range of motion.  Specialty Comments:  No specialty comments available.  Imaging: No results found.   PMFS History: Patient Active Problem List   Diagnosis Date Noted  . Bimalleolar ankle fracture 12/15/2016  . DM2 (diabetes mellitus, type 2) (HCC) 12/15/2016  . HTN (hypertension) 12/15/2016  . Hypokalemia 12/15/2016  . Hypernatremia 12/15/2016   Past Medical History:  Diagnosis Date  . Diabetes mellitus without complication (HCC)   . Hyperlipidemia   . Hypertension     Family History  Problem Relation Age of Onset  . Osteoporosis Neg Hx     No past surgical history on file. Social History   Occupational History  . Not on file.   Social History Main Topics  . Smoking status: Never Smoker  . Smokeless tobacco: Never Used  . Alcohol use No  . Drug use: No  . Sexual activity: Not on file

## 2016-12-27 DIAGNOSIS — N390 Urinary tract infection, site not specified: Secondary | ICD-10-CM | POA: Diagnosis not present

## 2017-01-01 ENCOUNTER — Other Ambulatory Visit (INDEPENDENT_AMBULATORY_CARE_PROVIDER_SITE_OTHER): Payer: Self-pay | Admitting: Orthopaedic Surgery

## 2017-01-01 ENCOUNTER — Ambulatory Visit (INDEPENDENT_AMBULATORY_CARE_PROVIDER_SITE_OTHER): Payer: Medicare Other | Admitting: Orthopaedic Surgery

## 2017-01-01 ENCOUNTER — Encounter (HOSPITAL_COMMUNITY): Payer: Self-pay | Admitting: *Deleted

## 2017-01-01 ENCOUNTER — Ambulatory Visit (INDEPENDENT_AMBULATORY_CARE_PROVIDER_SITE_OTHER): Payer: Medicare Other

## 2017-01-01 ENCOUNTER — Encounter (INDEPENDENT_AMBULATORY_CARE_PROVIDER_SITE_OTHER): Payer: Self-pay | Admitting: Orthopaedic Surgery

## 2017-01-01 VITALS — BP 135/73 | HR 61 | Ht 68.0 in | Wt 250.0 lb

## 2017-01-01 DIAGNOSIS — S82841D Displaced bimalleolar fracture of right lower leg, subsequent encounter for closed fracture with routine healing: Secondary | ICD-10-CM

## 2017-01-01 DIAGNOSIS — S8261XD Displaced fracture of lateral malleolus of right fibula, subsequent encounter for closed fracture with routine healing: Secondary | ICD-10-CM

## 2017-01-01 MED ORDER — INSULIN ASPART 100 UNIT/ML ~~LOC~~ SOLN
0.0000 [IU] | Freq: Three times a day (TID) | SUBCUTANEOUS | Status: DC
  Administered 2017-01-04 – 2017-01-05 (×2): 1 [IU] via SUBCUTANEOUS
  Administered 2017-01-05: 2 [IU] via SUBCUTANEOUS
  Filled 2017-01-01 (×27): qty 0.09

## 2017-01-01 NOTE — Progress Notes (Signed)
   Office Visit Note   Patient: Loretta DibbleCarol A [name]           Date of Birth: 06/25/1946           MRN: [id number] Visit Date: 01/01/2017              Requested by: Darci NeedleWalter Kohut, MD [address] [place], [place] [id number] PCP: Michiel SitesKOHUT,WALTER DENNIS, MD   Assessment & Plan: Visit Diagnoses:  1. Closed bimalleolar fracture of right ankle with routine healing, subsequent encounter     Plan: Since she has further displacement of the lateral malleolus fracture best treatment option at this point would be ORIF procedure. Surgical procedure discussed along with potential rehabilitation/recovery time. Patient will [facility] postop.  Preoperative paperwork filled out and will try to get this done next week. Continue nonweightbearing and short leg cast. Elevate foot as much as possible to help decrease swelling.  Follow-Up Instructions: Return for Needs return office visit one week postop..   Orders:  Orders Placed This Encounter  Procedures  . XR Ankle Complete Right   No orders of the defined types were placed in this encounter.     Procedures: No procedures performed   Clinical Data: No additional findings.   Subjective: Chief Complaint  Patient presents with  . Right Ankle - Fracture, Follow-up    Patient returns for one week follow up right lateral malleolus fracture. She remains nonweightbearing in the cast. She denies any pain.    Review of Systems  Constitutional: Negative.   HENT: Negative.   Cardiovascular: Negative.   Psychiatric/Behavioral: Negative.      Objective: Vital Signs: BP 135/73   Pulse 61   Ht 5\' 8"  (1.727 m)   Wt 250 lb (113.4 kg)   BMI 38.01 kg/m   Physical Exam  Constitutional: She is oriented to person, place, and time. No distress.  HENT:  Head: Normocephalic and atraumatic.  Eyes: EOM are normal. Pupils are equal, round, and reactive to light.  Neck: Normal range of motion.    Pulmonary/Chest: No respiratory distress.  Musculoskeletal:  Right lower extremity short leg cast intact. Moves toes well. She does have some swelling of her toes although not extreme.  Neurological: She is alert and oriented to person, place, and time.    Ortho Exam  Specialty Comments:  No specialty comments available.  Imaging: Xr Ankle Complete Right  Result Date: 01/01/2017 X-rays right ankle today show that there is displacement of the lateral malleolus fracture. Also lateral shifting of the talus. Impression displaced right lateral malleolus fracture.    PMFS History: Patient Active Problem List   Diagnosis Date Noted  . Bimalleolar ankle fracture 12/15/2016  . DM2 (diabetes mellitus, type 2) (HCC) 12/15/2016  . HTN (hypertension) 12/15/2016  . Hypokalemia 12/15/2016  . Hypernatremia 12/15/2016   Past Medical History:  Diagnosis Date  . Diabetes mellitus without complication (HCC)   . Hyperlipidemia   . Hypertension     Family History  Problem Relation Age of Onset  . Osteoporosis Neg Hx     No past surgical history on file. Social History   Occupational History  . Not on file.   Social History Main Topics  . Smoking status: Never Smoker  . Smokeless tobacco: Never Used  . Alcohol use No  . Drug use: No  . Sexual activity: Not on file

## 2017-01-01 NOTE — Pre-Procedure Instructions (Signed)
    Miguel DibbleCarol A Vanpelt  01/01/2017     Your procedure is scheduled on Monday, January 15.  [facility] at 10:00AM   Call this number if you have problems the morning of surgery:  [phone]   Remember:  Do not eat food or drink liquids after midnight Sunday, January 14.  Take these medicines the morning of surgery with A SIP OF WATER: Amlodipine, Bystolic.  If needed and she tolerates it on an empty stomach, she may take Oxycodone.   Do not wear jewelry, make-up or nail polish.  Do not wear lotions, powders, or perfumes, or deoderant.  Do not bring valuables to the hospital.  [facility] is not responsible for any belongings or valuables.  Contacts, dentures or bridgework may not be worn into surgery.  Leave your suitcase in the car.  After surgery it may be brought to your room.  .Marland Kitchen

## 2017-01-04 ENCOUNTER — Encounter (HOSPITAL_COMMUNITY): Payer: Self-pay | Admitting: Anesthesiology

## 2017-01-04 ENCOUNTER — Encounter (HOSPITAL_COMMUNITY)
Admission: AD | Disposition: A | Discharge status: Skilled Nursing Facility | Payer: Self-pay | Source: Ambulatory Visit | Attending: Orthopaedic Surgery

## 2017-01-04 ENCOUNTER — Inpatient Hospital Stay (HOSPITAL_COMMUNITY)
Admission: AD | Admit: 2017-01-04 | Discharge: 2017-01-05 | DRG: 494 | Disposition: A | Discharge status: Skilled Nursing Facility | Payer: Medicare Other | Source: Ambulatory Visit | Attending: Orthopaedic Surgery | Admitting: Orthopaedic Surgery

## 2017-01-04 ENCOUNTER — Ambulatory Visit (HOSPITAL_COMMUNITY): Payer: Medicare Other | Admitting: Anesthesiology

## 2017-01-04 DIAGNOSIS — Z8489 Family history of other specified conditions: Secondary | ICD-10-CM

## 2017-01-04 DIAGNOSIS — S8261XD Displaced fracture of lateral malleolus of right fibula, subsequent encounter for closed fracture with routine healing: Secondary | ICD-10-CM

## 2017-01-04 DIAGNOSIS — W19XXXA Unspecified fall, initial encounter: Secondary | ICD-10-CM | POA: Diagnosis present

## 2017-01-04 DIAGNOSIS — E119 Type 2 diabetes mellitus without complications: Secondary | ICD-10-CM | POA: Diagnosis present

## 2017-01-04 DIAGNOSIS — Z7409 Other reduced mobility: Secondary | ICD-10-CM

## 2017-01-04 DIAGNOSIS — Z7982 Long term (current) use of aspirin: Secondary | ICD-10-CM

## 2017-01-04 DIAGNOSIS — M1990 Unspecified osteoarthritis, unspecified site: Secondary | ICD-10-CM | POA: Diagnosis present

## 2017-01-04 DIAGNOSIS — E669 Obesity, unspecified: Secondary | ICD-10-CM | POA: Diagnosis present

## 2017-01-04 DIAGNOSIS — I10 Essential (primary) hypertension: Secondary | ICD-10-CM | POA: Diagnosis present

## 2017-01-04 DIAGNOSIS — Z79899 Other long term (current) drug therapy: Secondary | ICD-10-CM

## 2017-01-04 DIAGNOSIS — S8261XA Displaced fracture of lateral malleolus of right fibula, initial encounter for closed fracture: Secondary | ICD-10-CM | POA: Diagnosis not present

## 2017-01-04 DIAGNOSIS — E876 Hypokalemia: Secondary | ICD-10-CM | POA: Diagnosis not present

## 2017-01-04 DIAGNOSIS — M79661 Pain in right lower leg: Secondary | ICD-10-CM

## 2017-01-04 DIAGNOSIS — S82891A Other fracture of right lower leg, initial encounter for closed fracture: Secondary | ICD-10-CM | POA: Diagnosis present

## 2017-01-04 DIAGNOSIS — Z888 Allergy status to other drugs, medicaments and biological substances status: Secondary | ICD-10-CM

## 2017-01-04 DIAGNOSIS — Z6838 Body mass index (BMI) 38.0-38.9, adult: Secondary | ICD-10-CM

## 2017-01-04 DIAGNOSIS — E785 Hyperlipidemia, unspecified: Secondary | ICD-10-CM | POA: Diagnosis present

## 2017-01-04 DIAGNOSIS — Z8262 Family history of osteoporosis: Secondary | ICD-10-CM

## 2017-01-04 HISTORY — PX: ORIF ANKLE FRACTURE: SHX5408

## 2017-01-04 LAB — GLUCOSE, CAPILLARY
GLUCOSE-CAPILLARY: 139 mg/dL — AB (ref 65–99)
GLUCOSE-CAPILLARY: 121 mg/dL — AB (ref 65–99)
GLUCOSE-CAPILLARY: 137 mg/dL — AB (ref 65–99)
Glucose-Capillary: 115 mg/dL — ABNORMAL HIGH (ref 65–99)

## 2017-01-04 LAB — CBC
HEMATOCRIT: 47.2 % — AB (ref 36.0–46.0)
HEMOGLOBIN: 16.2 g/dL — AB (ref 12.0–15.0)
MCH: 29.1 pg (ref 26.0–34.0)
MCHC: 34.3 g/dL (ref 30.0–36.0)
MCV: 84.7 fL (ref 78.0–100.0)
Platelets: 181 10*3/uL (ref 150–400)
RBC: 5.57 MIL/uL — AB (ref 3.87–5.11)
RDW: 13.1 % (ref 11.5–15.5)
WBC: 6.9 10*3/uL (ref 4.0–10.5)

## 2017-01-04 LAB — MRSA PCR SCREENING: MRSA BY PCR: NEGATIVE

## 2017-01-04 LAB — BASIC METABOLIC PANEL
Anion gap: 11 (ref 5–15)
BUN: 10 mg/dL (ref 6–20)
CO2: 28 mmol/L (ref 22–32)
Calcium: 9.4 mg/dL (ref 8.9–10.3)
Chloride: 102 mmol/L (ref 101–111)
Creatinine, Ser: 0.63 mg/dL (ref 0.44–1.00)
GFR calc non Af Amer: >60 mL/min (ref >60)
Glucose, Bld: 132 mg/dL — ABNORMAL HIGH (ref 65–99)
POTASSIUM: 3.4 mmol/L — AB (ref 3.5–5.1)
SODIUM: 141 mmol/L (ref 135–145)

## 2017-01-04 SURGERY — OPEN REDUCTION INTERNAL FIXATION (ORIF) ANKLE FRACTURE
Anesthesia: General | Site: Foot | Laterality: Right

## 2017-01-04 MED ORDER — DAPAGLIFLOZIN PRO-METFORMIN ER 10-1000 MG PO TB24: 1.0000 | ORAL_TABLET | Freq: Every day | ORAL | Status: DC

## 2017-01-04 MED ORDER — AMLODIPINE BESYLATE 5 MG PO TABS
5.0000 mg | ORAL_TABLET | Freq: Every day | ORAL | Status: DC
  Administered 2017-01-05: 5 mg via ORAL
  Filled 2017-01-04: qty 1

## 2017-01-04 MED ORDER — ACETAMINOPHEN 650 MG RE SUPP: 650.0000 mg | Freq: Four times a day (QID) | RECTAL | Status: DC | PRN

## 2017-01-04 MED ORDER — NEBIVOLOL HCL 5 MG PO TABS
10.0000 mg | ORAL_TABLET | Freq: Every day | ORAL | Status: DC
  Administered 2017-01-05: 10 mg via ORAL
  Filled 2017-01-04: qty 2

## 2017-01-04 MED ORDER — MEPERIDINE HCL 25 MG/ML IJ SOLN: 6.2500 mg | INTRAMUSCULAR | Status: DC | PRN

## 2017-01-04 MED ORDER — 0.9 % SODIUM CHLORIDE (POUR BTL) OPTIME
TOPICAL | Status: DC | PRN
  Administered 2017-01-04: 1000 mL

## 2017-01-04 MED ORDER — CHLORHEXIDINE GLUCONATE 4 % EX LIQD: 60.0000 mL | Freq: Once | CUTANEOUS | Status: DC

## 2017-01-04 MED ORDER — EPHEDRINE 5 MG/ML INJ
INTRAVENOUS | Status: AC
  Filled 2017-01-04: qty 10

## 2017-01-04 MED ORDER — METOCLOPRAMIDE HCL 5 MG/ML IJ SOLN: 5.0000 mg | Freq: Three times a day (TID) | INTRAMUSCULAR | Status: DC | PRN

## 2017-01-04 MED ORDER — HYDROMORPHONE HCL 2 MG/ML IJ SOLN: 0.5000 mg | INTRAMUSCULAR | Status: DC | PRN

## 2017-01-04 MED ORDER — IRBESARTAN 300 MG PO TABS
300.0000 mg | ORAL_TABLET | Freq: Every day | ORAL | Status: DC
  Administered 2017-01-04 – 2017-01-05 (×2): 300 mg via ORAL
  Filled 2017-01-04 (×2): qty 1

## 2017-01-04 MED ORDER — LACTATED RINGERS IV SOLN
INTRAVENOUS | Status: DC
  Administered 2017-01-04: 50 mL/h via INTRAVENOUS

## 2017-01-04 MED ORDER — ACETAMINOPHEN 325 MG PO TABS
650.0000 mg | ORAL_TABLET | Freq: Four times a day (QID) | ORAL | Status: DC | PRN
  Administered 2017-01-04: 650 mg via ORAL
  Filled 2017-01-04: qty 2

## 2017-01-04 MED ORDER — BUPIVACAINE HCL (PF) 0.25 % IJ SOLN
INTRAMUSCULAR | Status: AC
  Filled 2017-01-04: qty 30

## 2017-01-04 MED ORDER — BUPIVACAINE HCL (PF) 0.25 % IJ SOLN
INTRAMUSCULAR | Status: DC | PRN
  Administered 2017-01-04: 22 mL

## 2017-01-04 MED ORDER — OLMESARTAN MEDOXOMIL-HCTZ 40-25 MG PO TABS: 1.0000 | ORAL_TABLET | Freq: Every day | ORAL | Status: DC

## 2017-01-04 MED ORDER — EPHEDRINE SULFATE 50 MG/ML IJ SOLN
INTRAMUSCULAR | Status: DC | PRN
  Administered 2017-01-04: 10 mg via INTRAVENOUS

## 2017-01-04 MED ORDER — ONDANSETRON HCL 4 MG PO TABS: 4.0000 mg | ORAL_TABLET | Freq: Four times a day (QID) | ORAL | Status: DC | PRN

## 2017-01-04 MED ORDER — PROPOFOL 10 MG/ML IV BOLUS
INTRAVENOUS | Status: DC | PRN
  Administered 2017-01-04: 150 mg via INTRAVENOUS

## 2017-01-04 MED ORDER — OXYCODONE HCL 5 MG PO TABS: 5.0000 mg | ORAL_TABLET | Freq: Four times a day (QID) | ORAL | Status: DC | PRN

## 2017-01-04 MED ORDER — CANAGLIFLOZIN 100 MG PO TABS
100.0000 mg | ORAL_TABLET | Freq: Every day | ORAL | Status: DC
  Administered 2017-01-05: 100 mg via ORAL
  Filled 2017-01-04: qty 1

## 2017-01-04 MED ORDER — IBUPROFEN 200 MG PO TABS
800.0000 mg | ORAL_TABLET | Freq: Two times a day (BID) | ORAL | Status: DC | PRN
  Administered 2017-01-04 – 2017-01-05 (×2): 800 mg via ORAL
  Filled 2017-01-04 (×2): qty 4

## 2017-01-04 MED ORDER — METOCLOPRAMIDE HCL 5 MG PO TABS: 5.0000 mg | ORAL_TABLET | Freq: Three times a day (TID) | ORAL | Status: DC | PRN

## 2017-01-04 MED ORDER — PROPOFOL 10 MG/ML IV BOLUS
INTRAVENOUS | Status: AC
  Filled 2017-01-04: qty 20

## 2017-01-04 MED ORDER — ONDANSETRON HCL 4 MG/2ML IJ SOLN: 4.0000 mg | Freq: Four times a day (QID) | INTRAMUSCULAR | Status: DC | PRN

## 2017-01-04 MED ORDER — CEFAZOLIN SODIUM-DEXTROSE 2-4 GM/100ML-% IV SOLN
2.0000 g | INTRAVENOUS | Status: AC
  Administered 2017-01-04: 2 g via INTRAVENOUS

## 2017-01-04 MED ORDER — KETOROLAC TROMETHAMINE 30 MG/ML IJ SOLN: 30.0000 mg | Freq: Once | INTRAMUSCULAR | Status: DC

## 2017-01-04 MED ORDER — CEFAZOLIN IN D5W 1 GM/50ML IV SOLN
1.0000 g | Freq: Three times a day (TID) | INTRAVENOUS | Status: AC
  Administered 2017-01-04 – 2017-01-05 (×2): 1 g via INTRAVENOUS
  Filled 2017-01-04 (×2): qty 50

## 2017-01-04 MED ORDER — LACTATED RINGERS IV SOLN
INTRAVENOUS | Status: DC | PRN
  Administered 2017-01-04 (×2): via INTRAVENOUS

## 2017-01-04 MED ORDER — LIDOCAINE 2% (20 MG/ML) 5 ML SYRINGE
INTRAMUSCULAR | Status: AC
  Filled 2017-01-04: qty 5

## 2017-01-04 MED ORDER — FENTANYL CITRATE (PF) 100 MCG/2ML IJ SOLN: 25.0000 ug | INTRAMUSCULAR | Status: DC | PRN

## 2017-01-04 MED ORDER — CEFAZOLIN SODIUM-DEXTROSE 2-4 GM/100ML-% IV SOLN
2.0000 g | INTRAVENOUS | Status: DC
  Filled 2017-01-04: qty 100

## 2017-01-04 MED ORDER — FENTANYL CITRATE (PF) 100 MCG/2ML IJ SOLN
INTRAMUSCULAR | Status: AC
  Filled 2017-01-04: qty 2

## 2017-01-04 MED ORDER — METFORMIN HCL ER 500 MG PO TB24
1000.0000 mg | ORAL_TABLET | Freq: Every day | ORAL | Status: DC
  Administered 2017-01-05: 1000 mg via ORAL
  Filled 2017-01-04: qty 2

## 2017-01-04 MED ORDER — ONDANSETRON HCL 4 MG/2ML IJ SOLN: 4.0000 mg | Freq: Once | INTRAMUSCULAR | Status: DC | PRN

## 2017-01-04 MED ORDER — SODIUM CHLORIDE 0.9 % IV SOLN
INTRAVENOUS | Status: DC
  Administered 2017-01-04: 16:00:00 via INTRAVENOUS

## 2017-01-04 MED ORDER — DOCUSATE SODIUM 100 MG PO CAPS
100.0000 mg | ORAL_CAPSULE | Freq: Two times a day (BID) | ORAL | Status: DC
  Administered 2017-01-05: 100 mg via ORAL
  Filled 2017-01-04 (×2): qty 1

## 2017-01-04 MED ORDER — MIDAZOLAM HCL 2 MG/2ML IJ SOLN
INTRAMUSCULAR | Status: AC
  Filled 2017-01-04: qty 2

## 2017-01-04 MED ORDER — POLYETHYLENE GLYCOL 3350 17 G PO PACK: 17.0000 g | PACK | Freq: Every day | ORAL | Status: DC | PRN

## 2017-01-04 MED ORDER — ASPIRIN 325 MG PO TABS
325.0000 mg | ORAL_TABLET | Freq: Every day | ORAL | Status: DC
  Administered 2017-01-04 – 2017-01-05 (×2): 325 mg via ORAL
  Filled 2017-01-04 (×2): qty 1

## 2017-01-04 MED ORDER — HYDROCHLOROTHIAZIDE 25 MG PO TABS
25.0000 mg | ORAL_TABLET | Freq: Every day | ORAL | Status: DC
  Administered 2017-01-04 – 2017-01-05 (×2): 25 mg via ORAL
  Filled 2017-01-04 (×2): qty 1

## 2017-01-04 MED ORDER — FENTANYL CITRATE (PF) 100 MCG/2ML IJ SOLN
INTRAMUSCULAR | Status: DC | PRN
  Administered 2017-01-04: 100 ug via INTRAVENOUS

## 2017-01-04 MED ORDER — FUROSEMIDE 40 MG PO TABS: 40.0000 mg | ORAL_TABLET | Freq: Every day | ORAL | Status: DC | PRN

## 2017-01-04 MED ORDER — LIDOCAINE HCL (CARDIAC) 20 MG/ML IV SOLN
INTRAVENOUS | Status: DC | PRN
  Administered 2017-01-04: 50 mg via INTRAVENOUS

## 2017-01-04 MED ORDER — ATORVASTATIN CALCIUM 20 MG PO TABS
20.0000 mg | ORAL_TABLET | Freq: Every day | ORAL | Status: DC
Start: 2017-01-04 — End: 2017-01-05
  Administered 2017-01-04: 20 mg via ORAL
  Filled 2017-01-04: qty 1

## 2017-01-04 MED ORDER — MIDAZOLAM HCL 5 MG/5ML IJ SOLN
INTRAMUSCULAR | Status: DC | PRN
  Administered 2017-01-04: 2 mg via INTRAVENOUS

## 2017-01-04 SURGICAL SUPPLY — 58 items
BANDAGE ACE 4X5 VEL STRL LF (GAUZE/BANDAGES/DRESSINGS) IMPLANT
BANDAGE ACE 6X5 VEL STRL LF (GAUZE/BANDAGES/DRESSINGS) ×2 IMPLANT
BANDAGE ELASTIC 4 VELCRO ST LF (GAUZE/BANDAGES/DRESSINGS) ×2 IMPLANT
BIT DRILL 2.5X2.75 QC CALB (BIT) ×2 IMPLANT
BIT DRILL CALIBRATED 2.7 (BIT) ×1 IMPLANT
BIT DRILL CALIBRATED 2.7MM (BIT) ×1
COVER MAYO STAND STRL (DRAPES) ×3 IMPLANT
COVER SURGICAL LIGHT HANDLE (MISCELLANEOUS) ×3 IMPLANT
CUFF TOURNIQUET SINGLE 34IN LL (TOURNIQUET CUFF) ×2 IMPLANT
DRAPE INCISE IOBAN 66X45 STRL (DRAPES) ×3 IMPLANT
DRAPE PROXIMA HALF (DRAPES) ×3 IMPLANT
DRSG PAD ABDOMINAL 8X10 ST (GAUZE/BANDAGES/DRESSINGS) ×3 IMPLANT
DURAPREP 26ML APPLICATOR (WOUND CARE) ×3 IMPLANT
ELECT REM PT RETURN 9FT ADLT (ELECTROSURGICAL) ×3
ELECTRODE REM PT RTRN 9FT ADLT (ELECTROSURGICAL) ×1 IMPLANT
GAUZE SPONGE 4X4 12PLY STRL (GAUZE/BANDAGES/DRESSINGS) ×3 IMPLANT
GAUZE XEROFORM 1X8 LF (GAUZE/BANDAGES/DRESSINGS) ×2 IMPLANT
GAUZE XEROFORM 5X9 LF (GAUZE/BANDAGES/DRESSINGS) ×3 IMPLANT
GLOVE BIOGEL PI IND STRL 7.5 (GLOVE) IMPLANT
GLOVE BIOGEL PI IND STRL 8 (GLOVE) ×2 IMPLANT
GLOVE BIOGEL PI INDICATOR 7.5 (GLOVE) ×2
GLOVE BIOGEL PI INDICATOR 8 (GLOVE) ×4
GLOVE ORTHO TXT STRL SZ7.5 (GLOVE) ×6 IMPLANT
GLOVE SURG SS PI 7.0 STRL IVOR (GLOVE) ×2 IMPLANT
GOWN STRL REUS W/ TWL LRG LVL3 (GOWN DISPOSABLE) ×1 IMPLANT
GOWN STRL REUS W/ TWL XL LVL3 (GOWN DISPOSABLE) ×1 IMPLANT
GOWN STRL REUS W/TWL 2XL LVL3 (GOWN DISPOSABLE) ×3 IMPLANT
GOWN STRL REUS W/TWL LRG LVL3 (GOWN DISPOSABLE) ×3
GOWN STRL REUS W/TWL XL LVL3 (GOWN DISPOSABLE) ×3
KIT BASIN OR (CUSTOM PROCEDURE TRAY) ×3 IMPLANT
KIT ROOM TURNOVER OR (KITS) ×3 IMPLANT
MANIFOLD NEPTUNE II (INSTRUMENTS) ×3 IMPLANT
NS IRRIG 1000ML POUR BTL (IV SOLUTION) ×3 IMPLANT
PACK ORTHO EXTREMITY (CUSTOM PROCEDURE TRAY) ×3 IMPLANT
PAD ABD 8X10 STRL (GAUZE/BANDAGES/DRESSINGS) ×2 IMPLANT
PAD ARMBOARD 7.5X6 YLW CONV (MISCELLANEOUS) ×6 IMPLANT
PAD CAST 4YDX4 CTTN HI CHSV (CAST SUPPLIES) ×1 IMPLANT
PADDING CAST COTTON 4X4 STRL (CAST SUPPLIES) ×3
PADDING CAST COTTON 6X4 STRL (CAST SUPPLIES) ×3 IMPLANT
PLATE LOCK 7H 92 BILAT FIB (Plate) ×2 IMPLANT
SCREW LOCK 3.5X12 DIST TIB (Screw) ×2 IMPLANT
SCREW LOCK CORT STAR 3.5X10 (Screw) ×6 IMPLANT
SCREW NON LOCKING LP 3.5 16MM (Screw) ×4 IMPLANT
SPLINT PLASTER CAST XFAST 5X30 (CAST SUPPLIES) IMPLANT
SPLINT PLASTER XFAST SET 5X30 (CAST SUPPLIES) ×2
SPONGE GAUZE 4X4 12PLY STER LF (GAUZE/BANDAGES/DRESSINGS) ×2 IMPLANT
SPONGE LAP 18X18 X RAY DECT (DISPOSABLE) ×3 IMPLANT
STAPLER VISISTAT 35W (STAPLE) ×2 IMPLANT
SUCTION FRAZIER HANDLE 10FR (MISCELLANEOUS) ×2
SUCTION TUBE FRAZIER 10FR DISP (MISCELLANEOUS) ×1 IMPLANT
SUT ETHILON 3 0 PS 1 (SUTURE) ×4 IMPLANT
SUT VIC AB 2-0 CT1 27 (SUTURE) ×6
SUT VIC AB 2-0 CT1 TAPERPNT 27 (SUTURE) ×2 IMPLANT
TOWEL OR 17X24 6PK STRL BLUE (TOWEL DISPOSABLE) ×3 IMPLANT
TOWEL OR 17X26 10 PK STRL BLUE (TOWEL DISPOSABLE) ×3 IMPLANT
TUBE CONNECTING 12'X1/4 (SUCTIONS) ×1
TUBE CONNECTING 12X1/4 (SUCTIONS) ×2 IMPLANT
YANKAUER SUCT BULB TIP NO VENT (SUCTIONS) ×3 IMPLANT

## 2017-01-04 NOTE — Anesthesia Procedure Notes (Signed)
Procedure Name: LMA Insertion Date/Time: 01/04/2017 1:15 PM Performed by: Gwenyth AllegraADAMI, Lavante Toso Pre-anesthesia Checklist: Patient identified, Emergency Drugs available, Suction available, Patient being monitored and Timeout performed Patient Re-evaluated:Patient Re-evaluated prior to inductionOxygen Delivery Method: Circle system utilized Preoxygenation: Pre-oxygenation with 100% oxygen Intubation Type: IV induction LMA: LMA inserted LMA Size: 4.0 Number of attempts: 1 Placement Confirmation: breath sounds checked- equal and bilateral Tube secured with: Tape Dental Injury: Teeth and Oropharynx as per pre-operative assessment

## 2017-01-04 NOTE — Interval H&P Note (Signed)
History and Physical Interval Note:  01/04/2017 12:10 PM  Loretta [name]  has presented today for surgery, with the diagnosis of Right Lateral Malleolus Fracture  The various methods of treatment have been discussed with the patient and family. After consideration of risks, benefits and other options for treatment, the patient has consented to  Procedure(s): OPEN REDUCTION INTERNAL FIXATION (ORIF) RIGHT ANKLE FRACTURE (Right) as a surgical intervention .  The patient's history has been reviewed, patient examined, no change in status, stable for surgery.  I have reviewed the patient's chart and labs.  Questions were answered to the patient's satisfaction.     Eldred MangesMark C Irish Breisch

## 2017-01-04 NOTE — Brief Op Note (Signed)
01/04/2017  2:41 PM  PATIENT:  Loretta [name]  71 y.o. female  PRE-OPERATIVE DIAGNOSIS:  Right Lateral Malleolus Fracture  POST-OPERATIVE DIAGNOSIS:  Right Lateral Malleolus Fracture  PROCEDURE:  Procedure(s): OPEN REDUCTION INTERNAL FIXATION (ORIF) RIGHT ANKLE FRACTURE (Right)  SURGEON:  Surgeon(s) and Role:    * Eldred MangesMark C Yates, MD - Primary  PHYSICIAN ASSISTANT: Zonia Kiefjames Jasmynn Pfalzgraf pa-c     ANESTHESIA:   general  EBL:  Total I/O In: 1400 [I.V.:1400] Out: 20 [Blood:20]  BLOOD ADMINISTERED:none  DRAINS: none   LOCAL MEDICATIONS USED:  MARCAINE     SPECIMEN:  No Specimen  DISPOSITION OF SPECIMEN:  N/A  COUNTS:  YES  TOURNIQUET:   Total Tourniquet Time Documented: Thigh (Right) - 24 minutes Total: Thigh (Right) - 24 minutes   DICTATION: .Reubin Milanragon Dictation  PLAN OF CARE: Admit to inpatient   PATIENT DISPOSITION:  PACU - hemodynamically stable.

## 2017-01-04 NOTE — H&P (Signed)
Loretta [name] is an 71 y.o. female.   Chief Complaint: right ankle pain HPI: Patient with history of right lateral malleolus fracture presents to the hospital. Recent x-rays in the clinic showed further displacement of the fracture. Comes in for operative treatment.  Past Medical History:  Diagnosis Date  . Diabetes mellitus without complication (HCC)    Type  . Hyperlipidemia   . Hypertension     History reviewed. No pertinent surgical history.  Family History  Problem Relation Age of Onset  . Osteoporosis Neg Hx    Social History:  reports that she has never smoked. She has never used smokeless tobacco. She reports that she does not drink alcohol or use drugs.  Allergies:  Allergies  Allergen Reactions  . Pravastatin Other (See Comments)    Caused leg paralysis     Medications Prior to Admission  Medication Sig Dispense Refill  . amLODipine (NORVASC) 5 MG tablet Take 5 mg by mouth daily.     Marland Kitchen aspirin EC 81 MG tablet Take 81 mg by mouth daily.    Marland Kitchen atorvastatin (LIPITOR) 20 MG tablet Take 20 mg by mouth at bedtime.     . Dapagliflozin-Metformin HCl ER (XIGDUO XR) 09-999 MG TB24 Take 1 tablet by mouth daily.    . furosemide (LASIX) 40 MG tablet Take 40 mg by mouth daily as needed for fluid or edema.     . Multiple Vitamin (MULTIVITAMIN WITH MINERALS) TABS tablet Take 1 tablet by mouth daily.    . nebivolol (BYSTOLIC) 10 MG tablet Take 10 mg by mouth daily.    Marland Kitchen olmesartan-hydrochlorothiazide (BENICAR HCT) 40-25 MG tablet Take 1 tablet by mouth daily.    Marland Kitchen oxyCODONE-acetaminophen (PERCOCET/ROXICET) 5-325 MG tablet Take 1 tablet by mouth every 6 (six) hours as needed for severe pain. 30 tablet 0    Results for orders placed or performed during the hospital encounter of 01/04/17 (from the past 48 hour(s))  Glucose, capillary     Status: Abnormal   Collection Time: 01/04/17 10:01 AM  Result Value Ref Range   Glucose-Capillary 139 (H) 65 - 99 mg/dL  Basic metabolic panel      Status: Abnormal   Collection Time: 01/04/17 10:47 AM  Result Value Ref Range   Sodium 141 135 - 145 mmol/L   Potassium 3.4 (L) 3.5 - 5.1 mmol/L   Chloride 102 101 - 111 mmol/L   CO2 28 22 - 32 mmol/L   Glucose, Bld 132 (H) 65 - 99 mg/dL   BUN 10 6 - 20 mg/dL   Creatinine, Ser 0.63 0.44 - 1.00 mg/dL   Calcium 9.4 8.9 - 10.3 mg/dL   GFR calc non Af Amer >60 >60 mL/min   GFR calc Af Amer >60 >60 mL/min    Comment: (NOTE) The eGFR has been calculated using the CKD EPI equation. This calculation has not been validated in all clinical situations. eGFR's persistently <60 mL/min signify possible Chronic Kidney Disease.    Anion gap 11 5 - 15  CBC     Status: Abnormal   Collection Time: 01/04/17 10:47 AM  Result Value Ref Range   WBC 6.9 4.0 - 10.5 K/uL   RBC 5.57 (H) 3.87 - 5.11 MIL/uL   Hemoglobin 16.2 (H) 12.0 - 15.0 g/dL   HCT 47.2 (H) 36.0 - 46.0 %   MCV 84.7 78.0 - 100.0 fL   MCH 29.1 26.0 - 34.0 pg   MCHC 34.3 30.0 - 36.0 g/dL   RDW 13.1  11.5 - 15.5 %   Platelets 181 150 - 400 K/uL   No results found.  Review of Systems  Constitutional: Negative.   HENT: Negative.   Respiratory: Negative.   Cardiovascular: Negative.   Gastrointestinal: Negative.   Genitourinary: Negative.   Musculoskeletal: Positive for joint pain.  Skin: Negative.   Neurological: Negative.   Endo/Heme/Allergies: Negative.   Psychiatric/Behavioral: Negative.     Blood pressure (!) 192/66, pulse (!) 55, temperature 98.2 F (36.8 C), temperature source Oral, resp. rate 18, height _0  (1.727 m), weight 250 lb (113.4 kg), SpO2 95 %. Physical Exam  Constitutional: She is oriented to person, place, and time. No distress.  HENT:  Head: Normocephalic and atraumatic.  Eyes: EOM are normal. Pupils are equal, round, and reactive to light.  Neck: Normal range of motion.  Respiratory: No respiratory distress.  GI: She exhibits no distension.  Musculoskeletal:  Right lower extremity short leg cast  intact. Moves toes well. She does have some swelling of her toes although not extreme  Neurological: She is alert and oriented to person, place, and time.  Skin: Skin is warm and dry.  Psychiatric: She has a normal mood and affect.     Assessment/Plan Right ankle lateral malleolar fracture  We'll proceed with ORIF as scheduled. Surgical procedure along with potential rehabilitation/recovery time discussed. All questions answered.  Benjiman Core, PA-C 01/04/2017, 11:32 AM

## 2017-01-04 NOTE — Anesthesia Preprocedure Evaluation (Addendum)
Anesthesia Evaluation  Patient identified by MRN, date of birth, ID band Patient awake    Reviewed: Allergy & Precautions, H&P , NPO status , Patient's Chart, lab work & pertinent test results, reviewed documented beta blocker date and time   Airway Mallampati: II  TM Distance: >3 FB Neck ROM: full    Dental no notable dental hx.    Pulmonary neg pulmonary ROS,    Pulmonary exam normal        Cardiovascular Exercise Tolerance: Good hypertension, Pt. on medications and Pt. on home beta blockers Normal cardiovascular exam     Neuro/Psych negative neurological ROS  negative psych ROS   GI/Hepatic negative GI ROS, Neg liver ROS,   Endo/Other  diabetes, Type 2  Renal/GU negative Renal ROS  negative genitourinary   Musculoskeletal   Abdominal (+) + obese,   Peds  Hematology negative hematology ROS (+)   Anesthesia Other Findings   Reproductive/Obstetrics negative OB ROS                            Anesthesia Physical Anesthesia Plan  ASA: II  Anesthesia Plan: General   Post-op Pain Management:    Induction: Intravenous  Airway Management Planned: LMA  Additional Equipment:   Intra-op Plan:   Post-operative Plan:   Informed Consent: I have reviewed the patients History and Physical, chart, labs and discussed the procedure including the risks, benefits and alternatives for the proposed anesthesia with the patient or authorized representative who has indicated his/her understanding and acceptance.     Plan Discussed with: CRNA and Surgeon  Anesthesia Plan Comments:         Anesthesia Quick Evaluation

## 2017-01-04 NOTE — Anesthesia Postprocedure Evaluation (Addendum)
Anesthesia Post Note  Patient: Loretta DibbleCarol A [name]  Procedure(s) Performed: Procedure(s) (LRB): OPEN REDUCTION INTERNAL FIXATION (ORIF) RIGHT ANKLE FRACTURE (Right)  Patient location during evaluation: PACU Anesthesia Type: General Level of consciousness: awake Pain management: pain level controlled Vital Signs Assessment: post-procedure vital signs reviewed and stable Respiratory status: spontaneous breathing Cardiovascular status: stable Postop Assessment: no signs of nausea or vomiting Anesthetic complications: no        Last Vitals:  Vitals:   01/04/17 1440 01/04/17 1455  BP: 133/70 139/63  Pulse: 62 (!) 58  Resp: 13 14  Temp:      Last Pain:  Vitals:   01/04/17 1425  TempSrc:   PainSc: 0-No pain   Pain Goal: Patients Stated Pain Goal: 3 (01/04/17 1058)      RLE Motor Response: Purposeful movement;Responds to commands (01/04/17 1455) RLE Sensation: Full sensation (01/04/17 1455)      Nakyra Bourn JR,JOHN Corrin Sieling

## 2017-01-04 NOTE — Op Note (Signed)
Preop diagnosis: Right lateral malleolar fracture with ankle displacement.  Postop diagnosis: Same  Procedure: Open reduction internal fixation right lateral malleolus.  Surgeon: Annell GreeningMark Karizma Cheek M.D.  Asst. Zonia KiefJames Owens PA-C medically necessary and present for the entire procedure anesthesia is the general plus Marcaine local tourniquet  Tourniquet:  27 minutes 350 pressure  Procedure: After standard prepping draping after removal of the old catheter been bivalved in the holding area was prepped was prepped with the DuraPrep. Leg was wrapped with an Esmarch after some sheets drapes sterile skin marker Betadine Steri-Drape was applied. Tourniquet was inflated 350. Ancef that been given prophylactically. Incision was made laterally so process section out the fracture fracture was reduced. Bones were soft and the spike of the fibula from the distal fragment had been partially removed expose the fracture site immobilized the the fractures density gradually shifted in the cast with patient weightbearing and she had lateral shifting the mortise pushing the distal aspect of the fibula laterally several millimeters. It was mobilized distracted fractures cleaned out there was mobilized brought back out to length and the Biomet locking plate was selected held with a self-retaining clamp on the lateral cortex of the fibula. Distally unicortical screws were placed distalmost with was a variable angle screw to unicortical screws were placed above that. Next screw hole was emptied since it was a fracture line and then rate 3 proximal bicortical screws were placed which held the fracture. AP lateral mortise views were obtained showing good reduction. Syndesmosis was closed. After irrigation with saline solution tourniquet was deflated hemostasis obtained reapproximation subtendinous tissue with 2-0 Vicryl and then skin staple skin closure. Taisa Deloria infiltration short-leg splint application and then transferred recovery  room.

## 2017-01-04 NOTE — Progress Notes (Signed)
Orthopedic Tech Progress Note Patient Details:  Miguel DibbleCarol A Lucier Mar 29, 1946 [id number]      Nikki DomCrawford, Raif Chachere 01/04/2017, 12:58 PM Bivalved cast rle as ordered by Dr Ophelia CharterYates

## 2017-01-04 NOTE — Transfer of Care (Signed)
Immediate Anesthesia Transfer of Care Note  Patient: Loretta [name]  Procedure(s) Performed: Procedure(s): OPEN REDUCTION INTERNAL FIXATION (ORIF) RIGHT ANKLE FRACTURE (Right)  Patient Location: PACU  Anesthesia Type:General  Level of Consciousness: awake and alert   Airway & Oxygen Therapy: Patient Spontanous Breathing and Patient connected to nasal cannula oxygen  Post-op Assessment: Report given to RN and Post -op Vital signs reviewed and stable  Post vital signs: Reviewed and stable  Last Vitals:  Vitals:   01/04/17 0958  BP: (!) 192/66  Pulse: (!) 55  Resp: 18  Temp: 36.8 C    Last Pain:  Vitals:   01/04/17 1058  TempSrc:   PainSc: 0-No pain      Patients Stated Pain Goal: 3 (01/04/17 1058)  Complications: No apparent anesthesia complications

## 2017-01-05 ENCOUNTER — Encounter (HOSPITAL_COMMUNITY): Payer: Self-pay | Admitting: Orthopaedic Surgery

## 2017-01-05 ENCOUNTER — Telehealth (INDEPENDENT_AMBULATORY_CARE_PROVIDER_SITE_OTHER): Payer: Self-pay | Admitting: Radiology

## 2017-01-05 DIAGNOSIS — R2681 Unsteadiness on feet: Secondary | ICD-10-CM | POA: Diagnosis not present

## 2017-01-05 DIAGNOSIS — Z888 Allergy status to other drugs, medicaments and biological substances status: Secondary | ICD-10-CM | POA: Diagnosis not present

## 2017-01-05 DIAGNOSIS — Z8262 Family history of osteoporosis: Secondary | ICD-10-CM | POA: Diagnosis not present

## 2017-01-05 DIAGNOSIS — E785 Hyperlipidemia, unspecified: Secondary | ICD-10-CM | POA: Diagnosis not present

## 2017-01-05 DIAGNOSIS — M1990 Unspecified osteoarthritis, unspecified site: Secondary | ICD-10-CM | POA: Diagnosis not present

## 2017-01-05 DIAGNOSIS — R278 Other lack of coordination: Secondary | ICD-10-CM | POA: Diagnosis not present

## 2017-01-05 DIAGNOSIS — S8261XA Displaced fracture of lateral malleolus of right fibula, initial encounter for closed fracture: Secondary | ICD-10-CM | POA: Diagnosis not present

## 2017-01-05 DIAGNOSIS — M25571 Pain in right ankle and joints of right foot: Secondary | ICD-10-CM | POA: Diagnosis not present

## 2017-01-05 DIAGNOSIS — M6281 Muscle weakness (generalized): Secondary | ICD-10-CM | POA: Diagnosis not present

## 2017-01-05 DIAGNOSIS — Z79899 Other long term (current) drug therapy: Secondary | ICD-10-CM | POA: Diagnosis not present

## 2017-01-05 DIAGNOSIS — Z4789 Encounter for other orthopedic aftercare: Secondary | ICD-10-CM | POA: Diagnosis not present

## 2017-01-05 DIAGNOSIS — R279 Unspecified lack of coordination: Secondary | ICD-10-CM | POA: Diagnosis not present

## 2017-01-05 DIAGNOSIS — Z7982 Long term (current) use of aspirin: Secondary | ICD-10-CM | POA: Diagnosis not present

## 2017-01-05 DIAGNOSIS — S82891A Other fracture of right lower leg, initial encounter for closed fracture: Secondary | ICD-10-CM | POA: Diagnosis not present

## 2017-01-05 DIAGNOSIS — Z8489 Family history of other specified conditions: Secondary | ICD-10-CM | POA: Diagnosis not present

## 2017-01-05 DIAGNOSIS — I10 Essential (primary) hypertension: Secondary | ICD-10-CM | POA: Diagnosis not present

## 2017-01-05 DIAGNOSIS — E119 Type 2 diabetes mellitus without complications: Secondary | ICD-10-CM | POA: Diagnosis not present

## 2017-01-05 DIAGNOSIS — S8261XD Displaced fracture of lateral malleolus of right fibula, subsequent encounter for closed fracture with routine healing: Secondary | ICD-10-CM | POA: Diagnosis not present

## 2017-01-05 DIAGNOSIS — M79661 Pain in right lower leg: Secondary | ICD-10-CM | POA: Diagnosis not present

## 2017-01-05 LAB — BASIC METABOLIC PANEL
ANION GAP: 11 (ref 5–15)
Anion gap: 12 (ref 5–15)
BUN: 8 mg/dL (ref 6–20)
BUN: 9 mg/dL (ref 6–20)
CALCIUM: 8.5 mg/dL — AB (ref 8.9–10.3)
CO2: 24 mmol/L (ref 22–32)
CO2: 25 mmol/L (ref 22–32)
CREATININE: 0.77 mg/dL (ref 0.44–1.00)
Calcium: 9.1 mg/dL (ref 8.9–10.3)
Chloride: 104 mmol/L (ref 101–111)
Chloride: 102 mmol/L (ref 101–111)
Creatinine, Ser: 0.67 mg/dL (ref 0.44–1.00)
GFR calc Af Amer: >60 mL/min (ref >60)
GFR calc Af Amer: >60 mL/min (ref >60)
GFR calc non Af Amer: >60 mL/min (ref >60)
GLUCOSE: 137 mg/dL — AB (ref 65–99)
Glucose, Bld: 166 mg/dL — ABNORMAL HIGH (ref 65–99)
POTASSIUM: 2.9 mmol/L — AB (ref 3.5–5.1)
Potassium: 3.1 mmol/L — ABNORMAL LOW (ref 3.5–5.1)
SODIUM: 139 mmol/L (ref 135–145)
Sodium: 139 mmol/L (ref 135–145)

## 2017-01-05 LAB — GLUCOSE, CAPILLARY
GLUCOSE-CAPILLARY: 152 mg/dL — AB (ref 65–99)
Glucose-Capillary: 136 mg/dL — ABNORMAL HIGH (ref 65–99)
Glucose-Capillary: 168 mg/dL — ABNORMAL HIGH (ref 65–99)

## 2017-01-05 MED ORDER — ASPIRIN EC 325 MG PO TBEC: 325.0000 mg | DELAYED_RELEASE_TABLET | Freq: Every day | ORAL | 0 refills | Status: DC

## 2017-01-05 MED ORDER — POTASSIUM CHLORIDE 20 MEQ PO PACK
40.0000 meq | PACK | Freq: Once | ORAL | Status: DC
  Filled 2017-01-05: qty 2

## 2017-01-05 MED ORDER — OXYCODONE-ACETAMINOPHEN 5-325 MG PO TABS: 1.0000 | ORAL_TABLET | Freq: Four times a day (QID) | ORAL | 0 refills | Status: DC | PRN

## 2017-01-05 NOTE — Discharge Summary (Signed)
Patient ID: Loretta [name] MRN: [id number] DOB/AGE: 07-01-1946 71 y.o.  Admit date: 01/04/2017 Discharge date: 01/05/2017  Admission Diagnoses:  Active Problems:   Displaced fracture of lateral malleolus of right fibula, subsequent encounter for closed fracture with routine healing   Closed right ankle fracture   Discharge Diagnoses:  Active Problems:   Displaced fracture of lateral malleolus of right fibula, subsequent encounter for closed fracture with routine healing   Closed right ankle fracture  status post Procedure(s): OPEN REDUCTION INTERNAL FIXATION (ORIF) RIGHT ANKLE FRACTURE  Past Medical History:  Diagnosis Date  . Diabetes mellitus without complication (HCC)    Type  . Hyperlipidemia   . Hypertension     Surgeries: Procedure(s): OPEN REDUCTION INTERNAL FIXATION (ORIF) RIGHT ANKLE FRACTURE on 01/04/2017   Consultants:   Discharged Condition: Improved  Hospital Course: Loretta [name] is an 71 y.o. female who was admitted 01/04/2017 for operative treatment of ankle fracture. Patient failed conservative treatments (please see the history and physical for the specifics) and had severe unremitting pain that affects sleep, daily activities and work/hobbies. After pre-op clearance, the patient was taken to the operating room on 01/04/2017 and underwent  Procedure(s): OPEN REDUCTION INTERNAL FIXATION (ORIF) RIGHT ANKLE FRACTURE.    Patient was given perioperative antibiotics: Anti-infectives    Start     Dose/Rate Route Frequency Ordered Stop   01/04/17 2100  ceFAZolin (ANCEF) IVPB 1 g/50 mL premix     1 g 100 mL/hr over 30 Minutes Intravenous Every 8 hours 01/04/17 1548 01/05/17 0618   01/04/17 1145  ceFAZolin (ANCEF) IVPB 2g/100 mL premix     2 g 200 mL/hr over 30 Minutes Intravenous On call to O.R. 01/04/17 1131 01/04/17 1305   01/04/17 1021  ceFAZolin (ANCEF) IVPB 2g/100 mL premix  Status:  Discontinued     2 g 200 mL/hr over 30 Minutes Intravenous On call  to O.R. 01/04/17 1021 01/04/17 1131       Patient was given sequential compression devices and early ambulation to prevent DVT.   Patient benefited maximally from hospital stay and there were no complications. At the time of discharge, the patient was urinating/moving their bowels without difficulty, tolerating a regular diet, pain is controlled with oral pain medications and they have been cleared by PT/OT.   Recent vital signs: Patient Vitals for the past 24 hrs:  BP Temp Temp src Pulse Resp SpO2  01/05/17 0546 (!) 167/74 97.8 F (36.6 C) Oral (!) 55 16 97 %  01/04/17 1900 (!) 144/68 97.5 F (36.4 C) Oral 62 16 91 %  01/04/17 1607 (!) 164/101 - - (!) 52 14 93 %  01/04/17 1520 137/86 97.2 F (36.2 C) - (!) 52 14 100 %  01/04/17 1510 (!) 146/70 - - (!) 53 15 100 %  01/04/17 1455 139/63 - - (!) 58 14 96 %     Recent laboratory studies:  Recent Labs  01/04/17 1047 01/05/17 0340  WBC 6.9  --   HGB 16.2*  --   HCT 47.2*  --   PLT 181  --   NA 141 139  K 3.4* 2.9*  CL 102 104  CO2 28 24  BUN 10 8  CREATININE 0.63 0.67  GLUCOSE 132* 137*  CALCIUM 9.4 8.5*     Discharge Medications:   Allergies as of 01/05/2017      Reactions   Pravastatin Other (See Comments)   Caused leg paralysis       Medication  List    TAKE these medications   amLODipine 5 MG tablet Commonly known as:  NORVASC Take 5 mg by mouth daily.   aspirin EC 325 MG tablet Take 1 tablet (325 mg total) by mouth daily. What changed:  medication strength  how much to take   atorvastatin 20 MG tablet Commonly known as:  LIPITOR Take 20 mg by mouth at bedtime.   furosemide 40 MG tablet Commonly known as:  LASIX Take 40 mg by mouth daily as needed for fluid or edema.   multivitamin with minerals Tabs tablet Take 1 tablet by mouth daily.   nebivolol 10 MG tablet Commonly known as:  BYSTOLIC Take 10 mg by mouth daily.   olmesartan-hydrochlorothiazide 40-25 MG tablet Commonly known as:  BENICAR  HCT Take 1 tablet by mouth daily.   oxyCODONE-acetaminophen 5-325 MG tablet Commonly known as:  PERCOCET/ROXICET Take 1 tablet by mouth every 6 (six) hours as needed for severe pain.   XIGDUO XR 09-999 MG Tb24 Generic drug:  Dapagliflozin-Metformin HCl ER Take 1 tablet by mouth daily.       Diagnostic Studies: Dg Ankle Complete Right  Result Date: 12/15/2016 CLINICAL DATA:  Fall on December 22, unable to bear weight. The patient had a second fall on 12/23. EXAM: RIGHT ANKLE - COMPLETE 3+ VIEW COMPARISON:  None. FINDINGS: Lateral malleolus fracture and nondisplaced fracture the posterior malleolus. Tibiotalar distance maintained common no visible fracture of the medial malleolus. Plantar and Achilles calcaneal spurs. IMPRESSION: 1. Stage III Weber B fracture with involvement of the lateral malleolus and posterior malleolus. Electronically Signed   By: Gaylyn RongWalter  Liebkemann M.D.   On: 12/15/2016 18:12   Dg Knee Complete 4 Views Right  Result Date: 12/15/2016 CLINICAL DATA:  Falls in early December, knee pain. EXAM: RIGHT KNEE - COMPLETE 4+ VIEW COMPARISON:  None. FINDINGS: Prominent tricompartmental spurring. Suspected 2.0 by 0.7 by 1.1 cm free osteochondral fragment in the intercondylar notch anteriorly. Suspected small knee effusion in the suprapatellar bursa. No acute fracture in the knee is identified. IMPRESSION: 1. Well corticated osteochondral free fragment centrally in the knee joint. 2. Prominent tricompartmental spurring.  Osteoarthritis. 3. Suspected knee effusion. Electronically Signed   By: Gaylyn RongWalter  Liebkemann M.D.   On: 12/15/2016 18:13   Xr Ankle Complete Right  Result Date: 01/01/2017 X-rays right ankle today show that there is displacement of the lateral malleolus fracture. Also lateral shifting of the talus. Impression displaced right lateral malleolus fracture.   Discharge Instructions    Call MD / Call 911    Complete by:  As directed    If you experience chest pain or  shortness of breath, CALL 911 and be transported to the hospital emergency room.  If you develope a fever above 101 F, pus (white drainage) or increased drainage or redness at the wound, or calf pain, call your surgeon's office.   Constipation Prevention    Complete by:  As directed    Drink plenty of fluids.  Prune juice may be helpful.  You may use a stool softener, such as Colace (over the counter) 100 mg twice a day.  Use MiraLax (over the counter) for constipation as needed.   Diet - low sodium heart healthy    Complete by:  As directed    Discharge instructions    Complete by:  As directed    Do not remove splint/dressing or get wet!  Strict nonweightbearing right foot.  Must elevate foot as much as possible to decrease  pain and swelling.   Driving restrictions    Complete by:  As directed    No driving   Increase activity slowly as tolerated    Complete by:  As directed       Follow-up Information    Eldred Manges, MD Follow up today.   Specialty:  Orthopedic Surgery Why:  need return office visit 2 weeks postop Contact information: [address] [place] [address] [phone]           Discharge Plan:  discharge to [facility]  Disposition:     Signed: Zonia Kief for Dr. Annell Greening [facility] 01/05/2017, 2:47 PM

## 2017-01-05 NOTE — Clinical Social Work Note (Signed)
Clinical Social Work Assessment  Patient Details  Name: Loretta DibbleCarol A [name] MRN: [id number] Date of Birth: Jun 16, 1946  Date of referral:  01/05/17               Reason for consult:  Facility Placement                Permission sought to share information with:  Family Supports Permission granted to share information::     Name::     [name]  Agency::     Relationship::  Spouse  Contact Information:  [phone]  Housing/Transportation Living arrangements for the past 2 months:  Skilled Nursing Facility Source of Information:  Adult Children Patient Interpreter Needed:  None Criminal Activity/Legal Involvement Pertinent to Current Situation/Hospitalization:  No - Comment as needed Significant Relationships:  Adult Children Lives with:  Self Do you feel safe going back to the place where you live?  Yes Need for family participation in patient care:     Care giving concerns:  Per RN at progression pt was confused this Am. CSW contacted pt's son [name].   Social Worker assessment / plan:  CSW spoke with pt's son [name] via telephone. Pt's son reports pt lives in a two story town home with steps and home would not be a safe option after surgery. Pt's son reports pt is going to persistently argue that she can go home. Pt is 0 weight barring. Pt's son reports she was at [facility] prior to admission for short term rehab. The plan is for the pt to return to [facility] at d/c. Pt will touch base with pt and follow up with facility.   Employment status:  Retired Database administratornsurance information:  Managed Medicare PT Recommendations:  Skilled Nursing Facility Information / Referral to community resources:  Skilled Nursing Facility  Patient/Family's Response to care:  Pt's son verbalized understanding of CSW role and expressed appreciation for support. Pt's son denies any concern regarding pt care at this time.  Patient/Family's Understanding of and Emotional Response to Diagnosis,  Current Treatment, and Prognosis:  Pt's son understanding and realistic regarding pt's physical limitations. Pt's son understands the need for pt to return to SNF. Pt's son denies any concern regarding pt treatment plan at this time.  Emotional Assessment Appearance:  Appears stated age Attitude/Demeanor/Rapport:  Unable to Assess Affect (typically observed):  Unable to Assess Orientation:  Oriented to Place, Oriented to Situation, Oriented to Self Alcohol / Substance use:  Not Applicable Psych involvement (Current and /or in the community):  No (Comment)  Discharge Needs  Concerns to be addressed:  No discharge needs identified Readmission within the last 30 days:  Yes Current discharge risk:  Dependent with Mobility Barriers to Discharge:  Continued Medical Work up   Safeway IncBridget A Mayton, LCSW 01/05/2017, 11:52 AM

## 2017-01-05 NOTE — Telephone Encounter (Signed)
I signed Fl-2 form

## 2017-01-05 NOTE — Clinical Social Work Placement (Signed)
   CLINICAL SOCIAL WORK PLACEMENT  NOTE  Date:  01/05/2017  Patient Details  Name: Loretta DibbleCarol A Memmott MRN: [id number] Date of Birth: [birth year]  Clinical Social Work is seeking post-discharge placement for this patient at the Skilled  Nursing Facility level of care (*CSW will initial, date and re-position this form in  chart as items are completed):      Patient/family provided with [facility] Clinical [facility] Work Department's list of facilities offering this level of care within the geographic area requested by the patient (or if unable, by the patient's family).  Yes   Patient/family informed of their freedom to choose among providers that offer the needed level of care, that participate in Medicare, Medicaid or managed care program needed by the patient, have an available bed and are willing to accept the patient.      Patient/family informed of [facility]'s ownership interest in [facility] and [facility], as well as of the fact that they are under no obligation to receive care at these facilities.  PASRR submitted to EDS on       PASRR number received on       Existing PASRR number confirmed on 01/05/17     FL2 transmitted to all facilities in geographic area requested by pt/family on 01/05/17     FL2 transmitted to all facilities within larger geographic area on       Patient informed that his/her managed care company has contracts with or will negotiate with certain facilities, including the following:        Yes   Patient/family informed of bed offers received.  Patient chooses bed at [facility]     Physician recommends and patient chooses bed at      Patient to be transferred to [facility] on 01/05/17.  Patient to be transferred to facility by       Patient family notified on   of transfer.  Name of family member notified:        PHYSICIAN       Additional Comment:    _______________________________________________ Volney AmericanBridget  A Mayton, LCSW 01/05/2017, 3:29 PM

## 2017-01-05 NOTE — Clinical Social Work Note (Signed)
CSW contacted MD to explain bed availability today. Md is agreeable and will sign the d/c summary.  [address]Loretta [name], [place] [phone]

## 2017-01-05 NOTE — Progress Notes (Signed)
Called report to nurse Fannie KneeSue at [facility]. Reviewed surgery, most recent vitals and labs, and wound/bandage care instructions. Patient to be transported to facility by [facility] van [facility], and patient will use her own wheelchair. Will continue to monitor patient until time of discharge.

## 2017-01-05 NOTE — Progress Notes (Signed)
Informed Dr. Ophelia CharterYates regarding pt's request to switch tylenol to ibuprofen. Ordered Ibuprofen 800mg  PO BID prn. TOVO. However, has encouraged pt to use oxycodone before bedtime d/t local anesthesia wearing off.

## 2017-01-05 NOTE — [place] FL2 (Signed)
[place] MEDICAID FL2 LEVEL OF CARE SCREENING TOOL     IDENTIFICATION  Patient Name: Loretta [name] Birthdate: 1946/01/28 Sex: female Admission Date (Current Location): 01/04/2017  [facility] and [place] Number:  Producer, television/film/video and Address:  [facility], [address], [place], [place] [address]      Provider Number: [id number]  Attending Physician Name and Address:  Eldred Manges, MD  Relative Name and Phone Number:       Current Level of Care: Hospital Recommended Level of Care: Skilled Nursing Facility Prior Approval Number:    Date Approved/Denied:   PASRR Number: [id number]  Discharge Plan: SNF    Current Diagnoses: Patient Active Problem List   Diagnosis Date Noted  . Closed right ankle fracture 01/04/2017  . Displaced fracture of lateral malleolus of right fibula, subsequent encounter for closed fracture with routine healing   . Bimalleolar ankle fracture 12/15/2016  . DM2 (diabetes mellitus, type 2) (HCC) 12/15/2016  . HTN (hypertension) 12/15/2016  . Hypokalemia 12/15/2016  . Hypernatremia 12/15/2016    Orientation RESPIRATION BLADDER Height & Weight     Situation, Place, Self  Normal Incontinent Weight: 250 lb (113.4 kg) Height:  5\' 8"  (172.7 cm)  BEHAVIORAL SYMPTOMS/MOOD NEUROLOGICAL BOWEL NUTRITION STATUS      Continent  (Please see d/c summary)  AMBULATORY STATUS COMMUNICATION OF NEEDS Skin   Extensive Assist Verbally Surgical wounds (Closed incision right food, compression wrap dressing)                       Personal Care Assistance Level of Assistance  Feeding, Dressing, Bathing Bathing Assistance: Maximum assistance Feeding assistance: Independent Dressing Assistance: Maximum assistance     Functional Limitations Info  Sight, Hearing, Speech Sight Info: Adequate Hearing Info: Adequate Speech Info: Adequate    SPECIAL CARE FACTORS FREQUENCY  OT (By licensed OT), PT (By licensed PT)     PT  Frequency: 3x week OT Frequency: 3x week            Contractures Contractures Info: Not present    Additional Factors Info  Code Status, Allergies Code Status Info: Full Code Allergies Info: Pravastatin           Current Medications (01/05/2017):  This is the current hospital active medication list Current Facility-Administered Medications  Medication Dose Route Frequency Provider Last Rate Last Dose  . 0.9 %  sodium chloride infusion   Intravenous Continuous Naida Sleight, PA-C 85 mL/hr at 01/04/17 1619    . acetaminophen (TYLENOL) tablet 650 mg  650 mg Oral Q6H PRN Naida Sleight, PA-C   650 mg at 01/04/17 1621   Or  . acetaminophen (TYLENOL) suppository 650 mg  650 mg Rectal Q6H PRN Naida Sleight, PA-C      . amLODipine (NORVASC) tablet 5 mg  5 mg Oral Daily Naida Sleight, PA-C   5 mg at 01/05/17 0849  . aspirin tablet 325 mg  325 mg Oral Daily Naida Sleight, PA-C   325 mg at 01/05/17 0848  . atorvastatin (LIPITOR) tablet 20 mg  20 mg Oral QHS Naida Sleight, PA-C   20 mg at 01/04/17 2100  . metFORMIN (GLUCOPHAGE-XR) 24 hr tablet 1,000 mg  1,000 mg Oral Q breakfast Eldred Manges, MD   1,000 mg at 01/05/17 0848   And  . canagliflozin [facility]) tablet 100 mg  100 mg Oral QAC breakfast Eldred Manges, MD  100 mg at 01/05/17 0847  . docusate sodium (COLACE) capsule 100 mg  100 mg Oral BID Naida SleightJames M Owens, PA-C   100 mg at 01/05/17 16100847  . furosemide (LASIX) tablet 40 mg  40 mg Oral Daily PRN Naida SleightJames M Owens, PA-C      . irbesartan (AVAPRO) tablet 300 mg  300 mg Oral Daily Eldred MangesMark C Yates, MD   300 mg at 01/05/17 0847   And  . hydrochlorothiazide (HYDRODIURIL) tablet 25 mg  25 mg Oral Daily Eldred MangesMark C Yates, MD   25 mg at 01/05/17 0848  . HYDROmorphone (DILAUDID) injection 0.5 mg  0.5 mg Intravenous Q3H PRN Naida SleightJames M Owens, PA-C      . ibuprofen (ADVIL,MOTRIN) tablet 800 mg  800 mg Oral BID PRN Eldred MangesMark C Yates, MD   800 mg at 01/05/17 0847  . insulin aspart (novoLOG) injection 0-9 Units  0-9 Units  Subcutaneous TID WC Meredeth IdeGagan S Lama, MD   1 Units at 01/05/17 0707  . lactated ringers infusion   Intravenous Continuous Leilani AbleFranklin Hatchett, MD 50 mL/hr at 01/04/17 1108 50 mL/hr at 01/04/17 1108  . metoCLOPramide (REGLAN) tablet 5-10 mg  5-10 mg Oral Q8H PRN Naida SleightJames M Owens, PA-C       Or  . metoCLOPramide (REGLAN) injection 5-10 mg  5-10 mg Intravenous Q8H PRN Naida SleightJames M Owens, PA-C      . nebivolol (BYSTOLIC) tablet 10 mg  10 mg Oral Daily Naida SleightJames M Owens, PA-C   10 mg at 01/05/17 0848  . ondansetron (ZOFRAN) tablet 4 mg  4 mg Oral Q6H PRN Naida SleightJames M Owens, PA-C       Or  . ondansetron [facility](ZOFRAN) injection 4 mg  4 mg Intravenous Q6H PRN Naida SleightJames M Owens, PA-C      . oxyCODONE (Oxy IR/ROXICODONE) immediate release tablet 5 mg  5 mg Oral Q6H PRN Naida SleightJames M Owens, PA-C      . polyethylene glycol (MIRALAX / GLYCOLAX) packet 17 g  17 g Oral Daily PRN Naida SleightJames M Owens, PA-C         Discharge Medications: Please see discharge summary for a list of discharge medications.  Relevant Imaging Results:  Relevant Lab Results:   Additional Information SSN: [id number]  Volney AmericanBridget A Mayton, LCSW

## 2017-01-05 NOTE — Care Management Obs Status (Signed)
MEDICARE OBSERVATION STATUS NOTIFICATION   Patient Details  Name: Loretta [name] MRN: [id number] Date of Birth: 12/30/1945   Medicare Observation Status Notification Given:  Yes    Caley Ciaramitaro Naomi, RN 01/05/2017, 3:42 PM 

## 2017-01-05 NOTE — Care Management Obs Status (Signed)
MEDICARE OBSERVATION STATUS NOTIFICATION   Patient Details  Name: Loretta DibbleCarol A [name] MRN: [id number] Date of Birth: 31-Jan-1946   Medicare Observation Status Notification Given:  Yes    Durenda GuthrieBrady, Parish Dubose Naomi, RN 01/05/2017, 3:42 PM

## 2017-01-05 NOTE — Clinical Social Work Note (Addendum)
Clinical Social Worker facilitated patient discharge including contacting patient family and facility to confirm patient discharge plans.  Clinical information faxed to facility and family agreeable with plan. [facility] This is the same transport that brought patient to facility. Per Herbert SetaHeather at [facility] she will be transported around 5. RN to call [phone] for report prior to discharge.  Clinical Social Worker will sign off for now as social work intervention is no longer needed. Please consult us again if new need arises.  [address] Amherst LaneBridget Mayton, ConnecticutLCSWA [phone]

## 2017-01-05 NOTE — Care Management CC44 (Signed)
Condition Code 44 Documentation Completed  Patient Details  Name: Loretta DibbleCarol A Chaikin MRN: [id number] Date of Birth: [birth year]   Condition Code 44 given:  Yes Patient signature on Condition Code 44 notice:  Yes Documentation of 2 MD's agreement:  Yes Code 44 added to claim:  Yes    Durenda GuthrieBrady, Brendaliz Kuk Naomi, RN 01/05/2017, 3:42 PM

## 2017-01-05 NOTE — Telephone Encounter (Signed)
Please advise 

## 2017-01-05 NOTE — Evaluation (Signed)
Occupational Therapy Evaluation Patient Details Name: Loretta DibbleCarol A [name] MRN: [id number] DOB: 1946/05/22 Today's Date: 01/05/2017    History of Present Illness Loretta [name] is a 71 y.o. female with PMH significant for DM, HLD, and HTN who presents by EMS from home for evaluation of fall 5 days ago.  Patient states she was walking in the bathroom and slipped on water on the floor causing her ankle to roll and fall backwards.  She states she was unable to get up after falling and has been lying on the ground since her fall.  She lives at home alone and was not able to contact anyone for help.  Her son had not heard from her in a couple of days and contacts the police to investigate who then found her lying on the floor.  She had difficulty ambulating with EMS due to right ankle pain.  Pt is now s/o ORIF Rt ankle 01/04/17   Clinical Impression   Patient presenting with decreased ADL and functional mobility independence and safety. Patient required up to min assist [facility]TA [facility] Pt with history of multiple falls at home. Patient currently functioning at an overall min to mod assist level (mainly bed level for ADLs). Patient will benefit from acute OT to increase overall independence in the areas of ADLs, functional mobility, and overall safety in order to safely discharge back to SNF.     Follow Up Recommendations  SNF;Supervision/Assistance - 24 hour    Equipment Recommendations   (defer to SNF)    Recommendations for Other Services  None at this time    Precautions / Restrictions Precautions Precautions: Fall Precaution Comments: multiple falls within past few months Restrictions Weight Bearing Restrictions: Yes RLE Weight Bearing: Non weight bearing    Mobility Bed Mobility - Per PT Overal bed mobility: Needs Assistance Bed Mobility: Sit to Supine     Supine to sit: Supervision Sit to supine: Min assist - per OT   General bed mobility  comments: with use of rail and slightly raised HOB  Transfers - Per PT Overall transfer level: Needs assistance Equipment used: Rolling walker (2 wheeled) Transfers: Sit to/from [employer] CorporationStand;Stand Pivot Transfers Sit to Stand: Min assist Stand pivot transfers: Mod assist (mod cues to maintain NWB Rt leg)       General transfer comment: Upon OT entering room, pt found throwing self into bed with 2 CNAs present. Educated pt on importance of technique and slow decend during transfers.     Balance - Per PT Overall balance assessment: Needs assistance;History of Falls Sitting-balance support: Bilateral upper extremity supported;Feet supported Sitting balance-Leahy Scale: Good     Standing balance support: Bilateral upper extremity supported Standing balance-Leahy Scale: Poor    ADL Overall ADL's : Needs assistance/impaired Eating/Feeding: Set up;Sitting   Grooming: Set up;Sitting   Upper Body Bathing: Minimal assistance;Sitting   Lower Body Bathing: Bed level;Moderate assistance   Upper Body Dressing : Minimal assistance;Sitting   Lower Body Dressing: Moderate assistance;Bed level   Toilet Transfer: BSC;+2 for safety/equipment;Moderate assistance   Toileting- Clothing Manipulation and Hygiene: Maximal assistance;Bed level     Tub/Shower Transfer Details (indicate cue type and reason): did not attempt   General ADL Comments: Plan is for pt to discharge back to SNF.    Vision Vision Assessment?: No apparent visual deficits          Pertinent Vitals/Pain Pain Assessment: Faces Pain Score: 4  Faces Pain Scale: Hurts  little more Pain Location: Rt ankle Pain Descriptors / Indicators: Aching;Grimacing;Guarding;Sore Pain Intervention(s): Limited activity within patient's tolerance;Monitored during session     Hand Dominance Right   Extremity/Trunk Assessment Upper Extremity Assessment Upper Extremity Assessment: Generalized weakness   Lower Extremity Assessment Lower  Extremity Assessment: Defer to PT evaluation RLE Deficits / Details: short leg cast   Cervical / Trunk Assessment Cervical / Trunk Assessment: Kyphotic   Communication Communication Communication: No difficulties   Cognition Arousal/Alertness: Awake/alert Behavior During Therapy: Impulsive Overall Cognitive Status: Within Functional Limits for tasks assessed General Comments: Pt with overall slow process and at times has a hard time saying what she intends to say.              Home Living Family/patient expects to be discharged to:: Skilled nursing facility Living Arrangements: Alone Available Help at Discharge: Family;Available PRN/intermittently Type of Home: House Home Access: Stairs to enter [employer] of Steps: 3 Entrance Stairs-Rails: Left Home Equipment: None          Prior Functioning/Environment Level of Independence: Independent        Comments: prior falls        OT Problem List: Decreased strength;Decreased activity tolerance;Impaired balance (sitting and/or standing);Decreased coordination;Decreased cognition;Decreased safety awareness;Decreased knowledge of precautions;Decreased knowledge of use of DME or AE;Pain   OT Treatment/Interventions: Self-care/ADL training;Therapeutic exercise;Energy conservation;DME and/or AE instruction;Therapeutic activities;Balance training;Patient/family education    OT Goals(Current goals can be found in the care plan section) Acute Rehab OT Goals Patient Stated Goal: get around better OT Goal Formulation: With patient Time For Goal Achievement: 01/19/17 Potential to Achieve Goals: Good ADL Goals Pt Will Transfer to Toilet: with min assist;bedside commode Pt/caregiver will Perform Home Exercise Program: Increased strength;Both right and left upper extremity;Independently;With written HEP provided Additional ADL Goal #1: Pt will be mod I with bed mobility as a precursor for ADLs to decrease burden of care   OT Frequency: Min 2X/week   Barriers to D/C: Decreased caregiver support   End of Session Nurse Communication: Mobility status  Activity Tolerance: Patient tolerated treatment well Patient left: in bed;with call bell/phone within reach;with bed alarm set   Time: 1540-1600 OT Time Calculation (min): 20 min Charges:  OT General Charges $OT Visit: 1 Procedure OT Evaluation $OT Eval Moderate Complexity: 1 Procedure  Edwin Cap , MS, OTR/L, CLT Pager: [phone] 01/05/2017, 4:10 PM

## 2017-01-05 NOTE — Telephone Encounter (Signed)
Corrie DandyMary, Nurse case manager is calling to check the status on Ms. Loretta [name].  Please call her back @ [phone].

## 2017-01-05 NOTE — Progress Notes (Signed)
   Subjective: 1 Day Post-Op Procedure(s) (LRB): OPEN REDUCTION INTERNAL FIXATION (ORIF) RIGHT ANKLE FRACTURE (Right) Patient reports pain as mild.    Objective: Vital signs in last 24 hours: Temp:  [97.2 F (36.2 C)-98.2 F (36.8 C)] 97.8 F (36.6 C) (01/16 0546) Pulse Rate:  [52-70] 55 (01/16 0546) Resp:  [12-18] 16 (01/16 0546) BP: (129-192)/(63-101) 167/74 (01/16 0546) SpO2:  [91 %-100 %] 97 % (01/16 0546) Weight:  [250 lb (113.4 kg)] 250 lb (113.4 kg) (01/15 0958)  Intake/Output from previous day: 01/15 0701 - 01/16 0700 In: 2162.3 [P.O.:360; I.V.:1802.3] Out: 95 [Urine:75; Blood:20] Intake/Output this shift: No intake/output data recorded.   Recent Labs  01/04/17 1047  HGB 16.2*    Recent Labs  01/04/17 1047  WBC 6.9  RBC 5.57*  HCT 47.2*  PLT 181    Recent Labs  01/04/17 1047 01/05/17 0340  NA 141 139  K 3.4* 2.9*  CL 102 104  CO2 28 24  BUN 10 8  CREATININE 0.63 0.67  GLUCOSE 132* 137*  CALCIUM 9.4 8.5*   No results for input(s): LABPT, INR in the last 72 hours.  Neurologically intact No results found.  Assessment/Plan: 1 Day Post-Op Procedure(s) (LRB): OPEN REDUCTION INTERNAL FIXATION (ORIF) RIGHT ANKLE FRACTURE (Right) Up with therapy  SL IV  Eldred MangesMark C Attilio Zeitler 01/05/2017, 7:37 AM

## 2017-01-05 NOTE — Evaluation (Addendum)
Physical Therapy Evaluation Patient Details Name: Loretta DibbleCarol A [name] MRN: [id number] DOB: 1946-08-09 Today's Date: 01/05/2017   History of Present Illness  Loretta DibbleCarol A [name] is a 71 y.o. female with PMH significant for DM, HLD, and HTN who presents by EMS from home for evaluation of fall 5 days ago.  Patient states she was walking in the bathroom and slipped on water on the floor causing her ankle to roll and fall backwards.  She states she was unable to get up after falling and has been lying on the ground since her fall.  She lives at home alone and was not able to contact anyone for help.  Her son had not heard from her in a couple of days and contacts the police to investigate who then found her lying on the floor.  She had difficulty ambulating with EMS due to right ankle pain.  Pt is now s/o ORIF Rt ankle 01/04/17  Clinical Impression  Pt with inaccessible home.  She reports utilizing CAPs services.  Pt unclear how much assist she may have available upon discharge home.  Therapy will continue to follow to assist with discharge planning and follow up recommendations. Will continue to follow patient while on this venue of care to progress mobility.    Follow Up Recommendations SNF    Equipment Recommendations  Rolling walker with 5" wheels;Wheelchair (measurements PT);3in1 (PT)    Recommendations for Other Services OT consult     Precautions / Restrictions Precautions Precautions: Fall Precaution Comments: multiple falls within past few months Restrictions Weight Bearing Restrictions: Yes RLE Weight Bearing: Non weight bearing      Mobility  Bed Mobility Overal bed mobility: Needs Assistance Bed Mobility: Supine to Sit     Supine to sit: Supervision     General bed mobility comments: with use of rail and slightly raised HOB  Transfers Overall transfer level: Needs assistance Equipment used: Rolling walker (2 wheeled) Transfers: Sit to/from [employer] CorporationStand;Stand Pivot Transfers Sit to  Stand: Min assist Stand pivot transfers: Mod assist (mod cues to maintain NWB Rt leg)       General transfer comment: poor eccentric control during transfer to chair  Ambulation/Gait                Stairs            Wheelchair Mobility    Modified Rankin (Stroke Patients Only)       Balance Overall balance assessment: Needs assistance;History of Falls Sitting-balance support: Bilateral upper extremity supported;Feet supported Sitting balance-Leahy Scale: Good     Standing balance support: Bilateral upper extremity supported Standing balance-Leahy Scale: Poor                               Pertinent Vitals/Pain Pain Assessment: 0-10 Pain Score: 4  Pain Location: Rt ankle Pain Descriptors / Indicators: Sore Pain Intervention(s): Limited activity within patient's tolerance;Monitored during session;Repositioned    Home Living Family/patient expects to be discharged to:: Private residence Living Arrangements: Alone Available Help at Discharge: Family;Available PRN/intermittently Type of Home: House Home Access: Stairs to enter Entrance Stairs-Rails: Left Entrance Stairs-Number of Steps: 3   Home Equipment: None      Prior Function Level of Independence: Independent         Comments: prior falls     Hand Dominance   Dominant Hand: Right    Extremity/Trunk Assessment   Upper Extremity Assessment Upper Extremity Assessment: Overall WFL for  tasks assessed    Lower Extremity Assessment Lower Extremity Assessment: Generalized weakness RLE Deficits / Details: short leg cast    Cervical / Trunk Assessment Cervical / Trunk Assessment: Kyphotic  Communication   Communication: No difficulties  Cognition Arousal/Alertness: Awake/alert Behavior During Therapy: WFL for tasks assessed/performed Overall Cognitive Status: Within Functional Limits for tasks assessed                      General Comments General comments (skin  integrity, edema, etc.): post activity HR 78 bpm, 95% on RA    Exercises Other Exercises Other Exercises: toe wiggle x 10 reps Rt foot sitting   Assessment/Plan    PT Assessment Patient needs continued PT services  PT Problem List Decreased strength;Decreased activity tolerance;Decreased balance;Decreased mobility;Decreased knowledge of precautions;Decreased knowledge of use of DME;Decreased range of motion;Decreased safety awareness;Pain          PT Treatment Interventions DME instruction;Gait training;Functional mobility training;Therapeutic activities;Therapeutic exercise;Balance training;Stair training;Patient/family education;Modalities;Wheelchair mobility training    PT Goals (Current goals can be found in the Care Plan section)  Acute Rehab PT Goals Patient Stated Goal: get around better PT Goal Formulation: With patient Time For Goal Achievement: 01/15/17 Potential to Achieve Goals: Good    Frequency Min 4X/week   Barriers to discharge Inaccessible home environment;Decreased caregiver support Pt reports her son may be able to assist some upon discharge but only prn    Co-evaluation               End of Session Equipment Utilized During Treatment: Gait belt Activity Tolerance: Patient tolerated treatment well Patient left: in chair;with call bell/phone within reach;with nursing/sitter in room Nurse Communication: Mobility status;Weight bearing status         Time: 1610-9604 PT Time Calculation (min) (ACUTE ONLY): 30 min   Charges:   PT Evaluation $PT Eval Low Complexity: 1 Procedure PT Treatments $Therapeutic Activity: 8-22 mins   PT G Codes:  Changing and maintaining body position Current: CM Goal: CL Based on transfers and gait ability     Nestor Lewandowsky, [place] [phone]  Judye Lorino 01/05/2017, 1:18 PM

## 2017-01-12 ENCOUNTER — Ambulatory Visit (INDEPENDENT_AMBULATORY_CARE_PROVIDER_SITE_OTHER): Payer: Medicare Other

## 2017-01-12 ENCOUNTER — Ambulatory Visit (INDEPENDENT_AMBULATORY_CARE_PROVIDER_SITE_OTHER): Payer: No Typology Code available for payment source | Admitting: Orthopaedic Surgery

## 2017-01-12 ENCOUNTER — Encounter (INDEPENDENT_AMBULATORY_CARE_PROVIDER_SITE_OTHER): Payer: Self-pay | Admitting: Orthopaedic Surgery

## 2017-01-12 VITALS — Ht 68.0 in | Wt 250.0 lb

## 2017-01-12 DIAGNOSIS — S8261XD Displaced fracture of lateral malleolus of right fibula, subsequent encounter for closed fracture with routine healing: Secondary | ICD-10-CM

## 2017-01-12 NOTE — Progress Notes (Signed)
   Office Visit Note   Patient: Loretta DibbleCarol A [name]           Date of Birth: Jul 18, 1946           MRN: [id number] Visit Date: 01/12/2017              Requested by: Darci NeedleWalter Kohut, MD [address] [place], [place] [id number] PCP: Michiel SitesKOHUT,WALTER DENNIS, MD   Assessment & Plan: Visit Diagnoses:  1. Closed displaced fracture of lateral malleolus of right fibula with routine healing     Plan:Return office visit 1 week for plan wound check and likely staple removal right ankle.  Follow-Up Instructions: No Follow-up on file.   Orders:  Orders Placed This Encounter  Procedures  . XR Ankle Complete Right   No orders of the defined types were placed in this encounter.     Procedures: No procedures performed   Clinical Data: No additional findings.   Subjective: Chief Complaint  Patient presents with  . Right Ankle - Routine Post Op    Patient returns status post ORIF Right Ankle 01/04/2017. She states that she is not having too much pain. She does have a twinge every now and then, but says it is not excruciating. She has remained nonweightbearing in splint. Staples intact. Ankle is swollen. She does have some redness along ankle.     Review of Systems   Objective: Vital Signs: There were no vitals taken for this visit.  Physical Exam  Ortho Exam  Specialty Comments:  No specialty comments available.  Imaging: No results found.   PMFS History: Patient Active Problem List   Diagnosis Date Noted  . Closed right ankle fracture 01/04/2017  . Displaced fracture of lateral malleolus of right fibula, subsequent encounter for closed fracture with routine healing   . Bimalleolar ankle fracture 12/15/2016  . DM2 (diabetes mellitus, type 2) (HCC) 12/15/2016  . HTN (hypertension) 12/15/2016  . Hypokalemia 12/15/2016  . Hypernatremia 12/15/2016   Past Medical History:  Diagnosis Date  . Diabetes mellitus without complication (HCC)    Type  . Hyperlipidemia     . Hypertension     Family History  Problem Relation Age of Onset  . Osteoporosis Neg Hx     Past Surgical History:  Procedure Laterality Date  . ORIF ANKLE FRACTURE Right 01/04/2017   Procedure: OPEN REDUCTION INTERNAL FIXATION (ORIF) RIGHT ANKLE FRACTURE;  Surgeon: Eldred MangesMark C Savana Spina, MD;  Location: [facility] OR;  Service: Orthopedics;  Laterality: Right;   Social History   Occupational History  . Not on file.   Social History Main Topics  . Smoking status: Never Smoker  . Smokeless tobacco: Never Used  . Alcohol use No  . Drug use: No  . Sexual activity: Not on file

## 2017-01-19 ENCOUNTER — Ambulatory Visit (INDEPENDENT_AMBULATORY_CARE_PROVIDER_SITE_OTHER): Payer: Medicare Other | Admitting: Orthopaedic Surgery

## 2017-01-19 ENCOUNTER — Encounter (INDEPENDENT_AMBULATORY_CARE_PROVIDER_SITE_OTHER): Payer: Self-pay | Admitting: Orthopaedic Surgery

## 2017-01-19 VITALS — Ht 68.0 in | Wt 250.0 lb

## 2017-01-19 DIAGNOSIS — S82841D Displaced bimalleolar fracture of right lower leg, subsequent encounter for closed fracture with routine healing: Secondary | ICD-10-CM

## 2017-01-19 NOTE — Progress Notes (Signed)
   Post-Op Visit Note   Patient: Loretta DibbleCarol A [name]           Date of Birth: 09-19-1946           MRN: [id number] Visit Date: 01/19/2017 PCP: Michiel SitesKOHUT,WALTER DENNIS, MD   Assessment & Plan:  Chief Complaint:  Chief Complaint  Patient presents with  . Right Ankle - Routine Post Op   Visit Diagnoses:  1. Closed bimalleolar fracture of right ankle with routine healing, subsequent encounter     Plan: Continue nonweightbearing. She can remove the cam for gentle ankle range of motion and lotion application. Staples were harvested today incision is healing nicely.  Follow-Up Instructions: Return in about 4 weeks (around 02/16/2017).   Orders:  No orders of the defined types were placed in this encounter.  No orders of the defined types were placed in this encounter.  HPI Patient returns for one week follow up closed displaced fracture lateral malleolus. She is 15 days post op ORIF Right ankle on 01/04/2017. She is here for wound check and possible staple removal. She continues to wear CAM boot, is non-weightbearing. Staples intact, incision looks good.   Imaging: No results found.  PMFS History: Patient Active Problem List   Diagnosis Date Noted  . Closed right ankle fracture 01/04/2017  . Displaced fracture of lateral malleolus of right fibula, subsequent encounter for closed fracture with routine healing   . Bimalleolar ankle fracture 12/15/2016  . DM2 (diabetes mellitus, type 2) (HCC) 12/15/2016  . HTN (hypertension) 12/15/2016  . Hypokalemia 12/15/2016  . Hypernatremia 12/15/2016   Past Medical History:  Diagnosis Date  . Diabetes mellitus without complication (HCC)    Type  . Hyperlipidemia   . Hypertension     Family History  Problem Relation Age of Onset  . Osteoporosis Neg Hx     Past Surgical History:  Procedure Laterality Date  . ORIF ANKLE FRACTURE Right 01/04/2017   Procedure: OPEN REDUCTION INTERNAL FIXATION (ORIF) RIGHT ANKLE FRACTURE;  Surgeon: Eldred MangesMark C Jelina Paulsen,  MD;  Location: [facility] OR;  Service: Orthopedics;  Laterality: Right;   Social History   Occupational History  . Not on file.   Social History Main Topics  . Smoking status: Never Smoker  . Smokeless tobacco: Never Used  . Alcohol use No  . Drug use: No  . Sexual activity: Not on file

## 2017-02-01 DIAGNOSIS — E119 Type 2 diabetes mellitus without complications: Secondary | ICD-10-CM | POA: Diagnosis not present

## 2017-02-04 DIAGNOSIS — Z9181 History of falling: Secondary | ICD-10-CM | POA: Diagnosis not present

## 2017-02-04 DIAGNOSIS — Z7984 Long term (current) use of oral hypoglycemic drugs: Secondary | ICD-10-CM | POA: Diagnosis not present

## 2017-02-04 DIAGNOSIS — E785 Hyperlipidemia, unspecified: Secondary | ICD-10-CM | POA: Diagnosis not present

## 2017-02-04 DIAGNOSIS — S8261XD Displaced fracture of lateral malleolus of right fibula, subsequent encounter for closed fracture with routine healing: Secondary | ICD-10-CM | POA: Diagnosis not present

## 2017-02-04 DIAGNOSIS — Z7982 Long term (current) use of aspirin: Secondary | ICD-10-CM | POA: Diagnosis not present

## 2017-02-04 DIAGNOSIS — I10 Essential (primary) hypertension: Secondary | ICD-10-CM | POA: Diagnosis not present

## 2017-02-04 DIAGNOSIS — I2510 Atherosclerotic heart disease of native coronary artery without angina pectoris: Secondary | ICD-10-CM | POA: Diagnosis not present

## 2017-02-04 DIAGNOSIS — E119 Type 2 diabetes mellitus without complications: Secondary | ICD-10-CM | POA: Diagnosis not present

## 2017-02-08 DIAGNOSIS — E119 Type 2 diabetes mellitus without complications: Secondary | ICD-10-CM | POA: Diagnosis not present

## 2017-02-08 DIAGNOSIS — I2510 Atherosclerotic heart disease of native coronary artery without angina pectoris: Secondary | ICD-10-CM | POA: Diagnosis not present

## 2017-02-08 DIAGNOSIS — E876 Hypokalemia: Secondary | ICD-10-CM | POA: Diagnosis not present

## 2017-02-08 DIAGNOSIS — N390 Urinary tract infection, site not specified: Secondary | ICD-10-CM | POA: Diagnosis not present

## 2017-02-10 DIAGNOSIS — E119 Type 2 diabetes mellitus without complications: Secondary | ICD-10-CM | POA: Diagnosis not present

## 2017-02-10 DIAGNOSIS — Z7984 Long term (current) use of oral hypoglycemic drugs: Secondary | ICD-10-CM | POA: Diagnosis not present

## 2017-02-10 DIAGNOSIS — S8261XD Displaced fracture of lateral malleolus of right fibula, subsequent encounter for closed fracture with routine healing: Secondary | ICD-10-CM | POA: Diagnosis not present

## 2017-02-10 DIAGNOSIS — E785 Hyperlipidemia, unspecified: Secondary | ICD-10-CM | POA: Diagnosis not present

## 2017-02-10 DIAGNOSIS — Z9181 History of falling: Secondary | ICD-10-CM | POA: Diagnosis not present

## 2017-02-10 DIAGNOSIS — I10 Essential (primary) hypertension: Secondary | ICD-10-CM | POA: Diagnosis not present

## 2017-02-10 DIAGNOSIS — Z7982 Long term (current) use of aspirin: Secondary | ICD-10-CM | POA: Diagnosis not present

## 2017-02-10 DIAGNOSIS — I2510 Atherosclerotic heart disease of native coronary artery without angina pectoris: Secondary | ICD-10-CM | POA: Diagnosis not present

## 2017-02-11 DIAGNOSIS — I2510 Atherosclerotic heart disease of native coronary artery without angina pectoris: Secondary | ICD-10-CM | POA: Diagnosis not present

## 2017-02-11 DIAGNOSIS — Z7982 Long term (current) use of aspirin: Secondary | ICD-10-CM | POA: Diagnosis not present

## 2017-02-11 DIAGNOSIS — Z7984 Long term (current) use of oral hypoglycemic drugs: Secondary | ICD-10-CM | POA: Diagnosis not present

## 2017-02-11 DIAGNOSIS — S8261XD Displaced fracture of lateral malleolus of right fibula, subsequent encounter for closed fracture with routine healing: Secondary | ICD-10-CM | POA: Diagnosis not present

## 2017-02-11 DIAGNOSIS — E119 Type 2 diabetes mellitus without complications: Secondary | ICD-10-CM | POA: Diagnosis not present

## 2017-02-11 DIAGNOSIS — Z9181 History of falling: Secondary | ICD-10-CM | POA: Diagnosis not present

## 2017-02-11 DIAGNOSIS — I10 Essential (primary) hypertension: Secondary | ICD-10-CM | POA: Diagnosis not present

## 2017-02-11 DIAGNOSIS — E785 Hyperlipidemia, unspecified: Secondary | ICD-10-CM | POA: Diagnosis not present

## 2017-02-12 DIAGNOSIS — Z7984 Long term (current) use of oral hypoglycemic drugs: Secondary | ICD-10-CM | POA: Diagnosis not present

## 2017-02-12 DIAGNOSIS — E785 Hyperlipidemia, unspecified: Secondary | ICD-10-CM | POA: Diagnosis not present

## 2017-02-12 DIAGNOSIS — S8261XD Displaced fracture of lateral malleolus of right fibula, subsequent encounter for closed fracture with routine healing: Secondary | ICD-10-CM | POA: Diagnosis not present

## 2017-02-12 DIAGNOSIS — I10 Essential (primary) hypertension: Secondary | ICD-10-CM | POA: Diagnosis not present

## 2017-02-12 DIAGNOSIS — Z9181 History of falling: Secondary | ICD-10-CM | POA: Diagnosis not present

## 2017-02-12 DIAGNOSIS — Z7982 Long term (current) use of aspirin: Secondary | ICD-10-CM | POA: Diagnosis not present

## 2017-02-12 DIAGNOSIS — I2510 Atherosclerotic heart disease of native coronary artery without angina pectoris: Secondary | ICD-10-CM | POA: Diagnosis not present

## 2017-02-12 DIAGNOSIS — E119 Type 2 diabetes mellitus without complications: Secondary | ICD-10-CM | POA: Diagnosis not present

## 2017-02-16 ENCOUNTER — Ambulatory Visit (INDEPENDENT_AMBULATORY_CARE_PROVIDER_SITE_OTHER): Payer: Medicare Other | Admitting: Orthopaedic Surgery

## 2017-02-16 ENCOUNTER — Ambulatory Visit (INDEPENDENT_AMBULATORY_CARE_PROVIDER_SITE_OTHER): Payer: Medicare Other

## 2017-02-16 ENCOUNTER — Encounter (INDEPENDENT_AMBULATORY_CARE_PROVIDER_SITE_OTHER): Payer: Self-pay | Admitting: Orthopaedic Surgery

## 2017-02-16 VITALS — BP 128/72 | HR 56 | Ht 68.0 in | Wt 250.0 lb

## 2017-02-16 DIAGNOSIS — S8261XD Displaced fracture of lateral malleolus of right fibula, subsequent encounter for closed fracture with routine healing: Secondary | ICD-10-CM

## 2017-02-16 DIAGNOSIS — I10 Essential (primary) hypertension: Secondary | ICD-10-CM | POA: Diagnosis not present

## 2017-02-16 DIAGNOSIS — Z7984 Long term (current) use of oral hypoglycemic drugs: Secondary | ICD-10-CM | POA: Diagnosis not present

## 2017-02-16 DIAGNOSIS — E785 Hyperlipidemia, unspecified: Secondary | ICD-10-CM | POA: Diagnosis not present

## 2017-02-16 DIAGNOSIS — Z7982 Long term (current) use of aspirin: Secondary | ICD-10-CM | POA: Diagnosis not present

## 2017-02-16 DIAGNOSIS — E119 Type 2 diabetes mellitus without complications: Secondary | ICD-10-CM | POA: Diagnosis not present

## 2017-02-16 DIAGNOSIS — I2510 Atherosclerotic heart disease of native coronary artery without angina pectoris: Secondary | ICD-10-CM | POA: Diagnosis not present

## 2017-02-16 DIAGNOSIS — Z9181 History of falling: Secondary | ICD-10-CM | POA: Diagnosis not present

## 2017-02-16 NOTE — Progress Notes (Deleted)
   Office Visit Note   Patient: Loretta DibbleCarol A [name]           Date of Birth: 04/16/1946           MRN: [id number] Visit Date: 02/16/2017              Requested by: Darci NeedleWalter Kohut, MD [address] [place], [place] [id number] PCP: Michiel SitesKOHUT,WALTER DENNIS, MD   Assessment & Plan: Visit Diagnoses:  1. Displaced fracture of lateral malleolus of right fibula, subsequent encounter for closed fracture with routine healing     Plan: She is at a skilled facility she can begin with progressive weightbearing as tolerated to full weightbearing. I will recheck her again in one month for clinical exam only no extra be needed less she's having persistent problems.  Follow-Up Instructions: No Follow-up on file.   Orders:  Orders Placed This Encounter  Procedures  . XR Ankle Complete Right   No orders of the defined types were placed in this encounter.     Procedures: No procedures performed   Clinical Data: No additional findings.   Subjective: Chief Complaint  Patient presents with  . Right Ankle - Routine Post Op    Patient here for 4 week follow up of r leg\ankle closed bimalleolar fx. Patient states boot gives great support and is not having any problems.    Review of Systems updated and unchanged from surgery. She staying in a skilled facility due to limited mobility.   Objective: Vital Signs: BP 128/72   Pulse (!) 56   Ht 5\' 8"  (1.727 m)   Wt 250 lb (113.4 kg)   BMI 38.01 kg/m   Physical Exam incision is well-healed. No swelling of the ankle joint noted. Screws are not palpable. Sensation the foot and pulses are normal.  Ortho Exam  Specialty Comments:  No specialty comments available.  Imaging: Xr Ankle Complete Right  Result Date: 02/16/2017 Three-view x-rays right ankle obtained which shows a satisfactory healing of the fibular fracture lateral malleolus. Ankle mortise is reduced talus is normal. Impression: Healed right lateral malleolar  fracture    PMFS History: Patient Active Problem List   Diagnosis Date Noted  . Closed right ankle fracture 01/04/2017  . Displaced fracture of lateral malleolus of right fibula, subsequent encounter for closed fracture with routine healing   . Bimalleolar ankle fracture 12/15/2016  . DM2 (diabetes mellitus, type 2) (HCC) 12/15/2016  . HTN (hypertension) 12/15/2016  . Hypokalemia 12/15/2016  . Hypernatremia 12/15/2016   Past Medical History:  Diagnosis Date  . Diabetes mellitus without complication (HCC)    Type  . Hyperlipidemia   . Hypertension     Family History  Problem Relation Age of Onset  . Osteoporosis Neg Hx     Past Surgical History:  Procedure Laterality Date  . ORIF ANKLE FRACTURE Right 01/04/2017   Procedure: OPEN REDUCTION INTERNAL FIXATION (ORIF) RIGHT ANKLE FRACTURE;  Surgeon: Eldred MangesMark C Montoya Watkin, MD;  Location: [facility];  Service: Orthopedics;  Laterality: Right;   Social History   Occupational History  . Not on file.   Social History Main Topics  . Smoking status: Never Smoker  . Smokeless tobacco: Never Used  . Alcohol use No  . Drug use: No  . Sexual activity: Not on file

## 2017-02-16 NOTE — Progress Notes (Deleted)
   Post-Op Visit Note   Patient: Loretta DibbleCarol A [name]           Date of Birth: [birth year]           MRN: [id number] Visit Date: 02/16/2017 PCP: Michiel SitesKOHUT,WALTER DENNIS, MD   Assessment & Plan:  Chief Complaint:  Chief Complaint  Patient presents with  . Right Ankle - Routine Post Op   Visit Diagnoses:  1. Displaced fracture of lateral malleolus of right fibula, subsequent encounter for closed fracture with routine healing     Plan: ***  Follow-Up Instructions: No Follow-up on file.   Orders:  No orders of the defined types were placed in this encounter.  No orders of the defined types were placed in this encounter.  HPI Patient returns for follow up status post ORIF Right Ankle Bimalleolar Fracture on 01/04/2017. She is in the CAM boot and in a wheelchair today.   Imaging: No results found.  PMFS History: Patient Active Problem List   Diagnosis Date Noted  . Closed right ankle fracture 01/04/2017  . Displaced fracture of lateral malleolus of right fibula, subsequent encounter for closed fracture with routine healing   . Bimalleolar ankle fracture 12/15/2016  . DM2 (diabetes mellitus, type 2) (HCC) 12/15/2016  . HTN (hypertension) 12/15/2016  . Hypokalemia 12/15/2016  . Hypernatremia 12/15/2016   Past Medical History:  Diagnosis Date  . Diabetes mellitus without complication (HCC)    Type  . Hyperlipidemia   . Hypertension     Family History  Problem Relation Age of Onset  . Osteoporosis Neg Hx     Past Surgical History:  Procedure Laterality Date  . ORIF ANKLE FRACTURE Right 01/04/2017   Procedure: OPEN REDUCTION INTERNAL FIXATION (ORIF) RIGHT ANKLE FRACTURE;  Surgeon: Eldred MangesMark C Yates, MD;  Location: [facility] OR;  Service: Orthopedics;  Laterality: Right;   Social History   Occupational History  . Not on file.   Social History Main Topics  . Smoking status: Never Smoker  . Smokeless tobacco: Never Used  . Alcohol use No  . Drug use: No  . Sexual activity: Not on  file

## 2017-02-16 NOTE — Progress Notes (Signed)
   Office Visit Note   Patient: Loretta DibbleCarol A Lupercio           Date of Birth: 07-11-1946           MRN: [id number] Visit Date: 02/16/2017              Requested by: Darci NeedleWalter Kohut, MD [address] [place], [place] [id number] PCP: Michiel SitesKOHUT,WALTER DENNIS, MD   Assessment & Plan: Visit Diagnoses:  1. Displaced fracture of lateral malleolus of right fibula, subsequent encounter for closed fracture with routine healing     Plan: X-ray show interval healing. She can progress weightbearing as she is active enough to go home from the skilled facility she can be discharged. She'll need to be independent in activities of daily living. She will return to see me in one month  Follow-Up Instructions: Return in about 1 month (around 03/16/2017).   Orders:  Orders Placed This Encounter  Procedures  . XR Ankle Complete Right   No orders of the defined types were placed in this encounter.     Procedures: No procedures performed   Clinical Data: No additional findings.   Subjective: Chief Complaint  Patient presents with  . Right Ankle - Routine Post Op    HPI post ankle fracture ORIF staying in the skilled facility.  Review of Systems 14 point review of systems updated and is unchanged. Positive for hypertension diabetes decreased mobility and healing ankle fracture   Objective: Vital Signs: BP 128/72   Pulse (!) 56   Ht 5\' 8"  (1.727 m)   Wt 250 lb (113.4 kg)   BMI 38.01 kg/m   Physical Exam ankle incisions well healed no swelling of the ankle pulses and normal sensation of the foot is normal is clinically nontender at the fracture site consistent with clinical healing  Ortho Exam  Specialty Comments:  No specialty comments available.  Imaging: Xr Ankle Complete Right  Result Date: 02/16/2017 Three-view x-rays right ankle obtained which shows a satisfactory healing of the fibular fracture lateral malleolus. Ankle mortise is reduced talus is normal. Impression:  Healed right lateral malleolar fracture    PMFS History: Patient Active Problem List   Diagnosis Date Noted  . Closed right ankle fracture 01/04/2017  . Displaced fracture of lateral malleolus of right fibula, subsequent encounter for closed fracture with routine healing   . Bimalleolar ankle fracture 12/15/2016  . DM2 (diabetes mellitus, type 2) (HCC) 12/15/2016  . HTN (hypertension) 12/15/2016  . Hypokalemia 12/15/2016  . Hypernatremia 12/15/2016   Past Medical History:  Diagnosis Date  . Diabetes mellitus without complication (HCC)    Type  . Hyperlipidemia   . Hypertension     Family History  Problem Relation Age of Onset  . Osteoporosis Neg Hx     Past Surgical History:  Procedure Laterality Date  . ORIF ANKLE FRACTURE Right 01/04/2017   Procedure: OPEN REDUCTION INTERNAL FIXATION (ORIF) RIGHT ANKLE FRACTURE;  Surgeon: Eldred MangesMark C Oumar Marcott, MD;  Location: [facility];  Service: Orthopedics;  Laterality: Right;   Social History   Occupational History  . Not on file.   Social History Main Topics  . Smoking status: Never Smoker  . Smokeless tobacco: Never Used  . Alcohol use No  . Drug use: No  . Sexual activity: Not on file

## 2017-02-18 DIAGNOSIS — G8929 Other chronic pain: Secondary | ICD-10-CM | POA: Diagnosis not present

## 2017-02-18 DIAGNOSIS — I10 Essential (primary) hypertension: Secondary | ICD-10-CM | POA: Diagnosis not present

## 2017-02-18 DIAGNOSIS — M1711 Unilateral primary osteoarthritis, right knee: Secondary | ICD-10-CM | POA: Diagnosis not present

## 2017-02-18 DIAGNOSIS — R262 Difficulty in walking, not elsewhere classified: Secondary | ICD-10-CM | POA: Diagnosis not present

## 2017-02-18 DIAGNOSIS — Z4789 Encounter for other orthopedic aftercare: Secondary | ICD-10-CM | POA: Diagnosis not present

## 2017-02-18 DIAGNOSIS — S82891A Other fracture of right lower leg, initial encounter for closed fracture: Secondary | ICD-10-CM | POA: Diagnosis not present

## 2017-02-18 DIAGNOSIS — R278 Other lack of coordination: Secondary | ICD-10-CM | POA: Diagnosis not present

## 2017-02-18 DIAGNOSIS — S8251XA Displaced fracture of medial malleolus of right tibia, initial encounter for closed fracture: Secondary | ICD-10-CM | POA: Diagnosis not present

## 2017-02-18 DIAGNOSIS — M6281 Muscle weakness (generalized): Secondary | ICD-10-CM | POA: Diagnosis not present

## 2017-02-18 DIAGNOSIS — E785 Hyperlipidemia, unspecified: Secondary | ICD-10-CM | POA: Diagnosis not present

## 2017-02-18 DIAGNOSIS — S8261XD Displaced fracture of lateral malleolus of right fibula, subsequent encounter for closed fracture with routine healing: Secondary | ICD-10-CM | POA: Diagnosis not present

## 2017-02-18 DIAGNOSIS — N390 Urinary tract infection, site not specified: Secondary | ICD-10-CM | POA: Diagnosis not present

## 2017-02-18 DIAGNOSIS — E119 Type 2 diabetes mellitus without complications: Secondary | ICD-10-CM | POA: Diagnosis not present

## 2017-02-18 DIAGNOSIS — M25561 Pain in right knee: Secondary | ICD-10-CM | POA: Diagnosis not present

## 2017-02-18 DIAGNOSIS — R296 Repeated falls: Secondary | ICD-10-CM | POA: Diagnosis not present

## 2017-02-18 DIAGNOSIS — E876 Hypokalemia: Secondary | ICD-10-CM | POA: Diagnosis not present

## 2017-02-19 DIAGNOSIS — I10 Essential (primary) hypertension: Secondary | ICD-10-CM | POA: Diagnosis not present

## 2017-02-19 DIAGNOSIS — E785 Hyperlipidemia, unspecified: Secondary | ICD-10-CM | POA: Diagnosis not present

## 2017-02-19 DIAGNOSIS — S82891A Other fracture of right lower leg, initial encounter for closed fracture: Secondary | ICD-10-CM | POA: Diagnosis not present

## 2017-02-19 DIAGNOSIS — E119 Type 2 diabetes mellitus without complications: Secondary | ICD-10-CM | POA: Diagnosis not present

## 2017-03-04 DIAGNOSIS — I10 Essential (primary) hypertension: Secondary | ICD-10-CM | POA: Diagnosis not present

## 2017-03-04 DIAGNOSIS — N390 Urinary tract infection, site not specified: Secondary | ICD-10-CM | POA: Diagnosis not present

## 2017-03-17 ENCOUNTER — Ambulatory Visit (INDEPENDENT_AMBULATORY_CARE_PROVIDER_SITE_OTHER): Payer: Medicare Other | Admitting: Orthopaedic Surgery

## 2017-03-17 ENCOUNTER — Ambulatory Visit (INDEPENDENT_AMBULATORY_CARE_PROVIDER_SITE_OTHER): Payer: Medicare Other

## 2017-03-17 ENCOUNTER — Encounter (INDEPENDENT_AMBULATORY_CARE_PROVIDER_SITE_OTHER): Payer: Self-pay | Admitting: Orthopaedic Surgery

## 2017-03-17 ENCOUNTER — Encounter (INDEPENDENT_AMBULATORY_CARE_PROVIDER_SITE_OTHER): Payer: Self-pay

## 2017-03-17 VITALS — Ht 68.0 in | Wt 250.0 lb

## 2017-03-17 DIAGNOSIS — S8261XD Displaced fracture of lateral malleolus of right fibula, subsequent encounter for closed fracture with routine healing: Secondary | ICD-10-CM

## 2017-03-17 DIAGNOSIS — M1711 Unilateral primary osteoarthritis, right knee: Secondary | ICD-10-CM | POA: Diagnosis not present

## 2017-03-17 DIAGNOSIS — M25561 Pain in right knee: Secondary | ICD-10-CM

## 2017-03-17 DIAGNOSIS — M2341 Loose body in knee, right knee: Secondary | ICD-10-CM

## 2017-03-17 DIAGNOSIS — G8929 Other chronic pain: Secondary | ICD-10-CM

## 2017-03-17 MED ORDER — BUPIVACAINE HCL 0.25 % IJ SOLN
6.0000 mL | INTRAMUSCULAR | Status: AC | PRN
  Administered 2017-03-17: 6 mL via INTRA_ARTICULAR

## 2017-03-17 MED ORDER — LIDOCAINE HCL 1 % IJ SOLN
3.0000 mL | INTRAMUSCULAR | Status: AC | PRN
  Administered 2017-03-17: 3 mL

## 2017-03-17 MED ORDER — METHYLPREDNISOLONE ACETATE 40 MG/ML IJ SUSP
40.0000 mg | INTRAMUSCULAR | Status: AC | PRN
  Administered 2017-03-17: 40 mg via INTRA_ARTICULAR

## 2017-03-17 NOTE — Progress Notes (Signed)
   Procedure Note  Patient: Loretta [name]             Date of Birth: 1946/11/09           MRN: [id number]             Visit Date: 03/17/2017  Procedures: Visit Diagnoses: Displaced fracture of lateral malleolus of right fibula, subsequent encounter for closed fracture with routine healing - Plan: XR Ankle Complete Right  Chronic pain of right knee - Plan: XR Knee 1-2 Views Right  Arthritis of right knee - Plan: Large Joint Injection/Arthrocentesis  Loose body of right knee  Large Joint Inj Date/Time: 03/17/2017 12:47 PM Performed by: Naida SleightWENS, Dorian Renfro M Authorized by: Naida SleightWENS, Verdelle Valtierra M   Consent Given by:  Patient Timeout: prior to procedure the correct patient, procedure, and site was verified   Location:  Knee Site:  R knee Needle Size:  25 G Needle Length:  1.5 inches Approach:  Anteromedial Ultrasound Guidance: No   Fluoroscopic Guidance: No   Arthrogram: No   Medications:  3 mL lidocaine 1 %; 40 mg methylPREDNISolone acetate 40 MG/ML; 6 mL bupivacaine 0.25 % Aspiration Attempted: No

## 2017-03-17 NOTE — Progress Notes (Signed)
Post-Op Visit Note   Patient: Loretta DibbleCarol A Santellan           Date of Birth: [birth year]           MRN: [id number] Visit Date: 03/17/2017 PCP: Michiel SitesKOHUT,WALTER DENNIS, MD   Assessment & Plan:  Chief Complaint:  Chief Complaint  Patient presents with  . Right Ankle - Routine Post Op   Visit Diagnoses:  1. Displaced fracture of lateral malleolus of right fibula, subsequent encounter for closed fracture with routine healing   2. Chronic pain of right knee   3. Arthritis of right knee   4. Loose body of right knee     Plan: We'll have patient follow-up in the office in 4 weeks for recheck. We'll get repeat x-rays of her ankle. To try to help give some relief of her right knee pain I did give an injection. Fingerstick glucose today in the clinic 95.  Therapy at the skilled Center can continue rehabilitation for right ankle and right knee. Modalities for right knee if it is helping. I advised patient that she does have moderate to severe tricompartmental arthritis with a large loose body. I think the loose body was the result of her fall in December.  Continue cam walker with weightbearing as tolerated.  Follow-Up Instructions: Return in about 4 weeks (around 04/14/2017).   Orders:  Orders Placed This Encounter  Procedures  . XR Ankle Complete Right  . XR Knee 1-2 Views Right   No orders of the defined types were placed in this encounter.  HPI Patient presents for follow up right ankle. She is status post ORIF Right Ankle on 01/14/2017. She is 72 days post op. She states that the ankle feels good. She has been moved to [facility] and they are doing extensive physical therapy with her. She states that her balance is somewhat better, but she still does not balance well on her left leg. She is having a lot of pain in the right knee. That is where she has most of the trouble.  Patient states that right knee has more bothersome since she has been weightbearing on the right foot. I did review previous  right knee x-rays from 12/15/2016 in the ED and though showed moderate to severe tricompartmental arthritis with a large osteochondral loose body seen in the anterior knee. Pain with knee range of motion and weightbearing. Also continues to have some soreness in the right ankle. States that she did not have any issues with her right knee before her fall in December.  Imaging: No results found.  PMFS History: Patient Active Problem List   Diagnosis Date Noted  . Closed right ankle fracture 01/04/2017  . Displaced fracture of lateral malleolus of right fibula, subsequent encounter for closed fracture with routine healing   . Bimalleolar ankle fracture 12/15/2016  . DM2 (diabetes mellitus, type 2) (HCC) 12/15/2016  . HTN (hypertension) 12/15/2016  . Hypokalemia 12/15/2016  . Hypernatremia 12/15/2016   Past Medical History:  Diagnosis Date  . Diabetes mellitus without complication (HCC)    Type  . Hyperlipidemia   . Hypertension     Family History  Problem Relation Age of Onset  . Osteoporosis Neg Hx     Past Surgical History:  Procedure Laterality Date  . ORIF ANKLE FRACTURE Right 01/04/2017   Procedure: OPEN REDUCTION INTERNAL FIXATION (ORIF) RIGHT ANKLE FRACTURE;  Surgeon: Eldred MangesMark C Yates, MD;  Location: [facility] OR;  Service: Orthopedics;  Laterality: Right;   Social History  Occupational History  . Not on file.   Social History Main Topics  . Smoking status: Never Smoker  . Smokeless tobacco: Never Used  . Alcohol use No  . Drug use: No  . Sexual activity: Not on file   Exam Right knee range of motion about 5-115 with discomfort. Pain with extension. Joint line tender. She does have some swelling with small effusion. Calf nontender. Right ankle she is tender over the distal fibula. Some swelling although not extreme. Neurovascular intact.

## 2017-03-23 DIAGNOSIS — I10 Essential (primary) hypertension: Secondary | ICD-10-CM | POA: Diagnosis not present

## 2017-03-29 DIAGNOSIS — S8251XA Displaced fracture of medial malleolus of right tibia, initial encounter for closed fracture: Secondary | ICD-10-CM | POA: Diagnosis not present

## 2017-03-29 DIAGNOSIS — E119 Type 2 diabetes mellitus without complications: Secondary | ICD-10-CM | POA: Diagnosis not present

## 2017-03-31 DIAGNOSIS — S8990XA Unspecified injury of unspecified lower leg, initial encounter: Secondary | ICD-10-CM | POA: Diagnosis not present

## 2017-03-31 DIAGNOSIS — R269 Unspecified abnormalities of gait and mobility: Secondary | ICD-10-CM | POA: Diagnosis not present

## 2017-03-31 DIAGNOSIS — S92909A Unspecified fracture of unspecified foot, initial encounter for closed fracture: Secondary | ICD-10-CM | POA: Diagnosis not present

## 2017-03-31 DIAGNOSIS — M6281 Muscle weakness (generalized): Secondary | ICD-10-CM | POA: Diagnosis not present

## 2017-04-02 DIAGNOSIS — E119 Type 2 diabetes mellitus without complications: Secondary | ICD-10-CM | POA: Diagnosis not present

## 2017-04-05 DIAGNOSIS — Z7984 Long term (current) use of oral hypoglycemic drugs: Secondary | ICD-10-CM | POA: Diagnosis not present

## 2017-04-05 DIAGNOSIS — Z9181 History of falling: Secondary | ICD-10-CM | POA: Diagnosis not present

## 2017-04-05 DIAGNOSIS — Z87440 Personal history of urinary (tract) infections: Secondary | ICD-10-CM | POA: Diagnosis not present

## 2017-04-05 DIAGNOSIS — S82841D Displaced bimalleolar fracture of right lower leg, subsequent encounter for closed fracture with routine healing: Secondary | ICD-10-CM | POA: Diagnosis not present

## 2017-04-05 DIAGNOSIS — I10 Essential (primary) hypertension: Secondary | ICD-10-CM | POA: Diagnosis not present

## 2017-04-05 DIAGNOSIS — Z955 Presence of coronary angioplasty implant and graft: Secondary | ICD-10-CM | POA: Diagnosis not present

## 2017-04-05 DIAGNOSIS — L851 Acquired keratosis [keratoderma] palmaris et plantaris: Secondary | ICD-10-CM | POA: Diagnosis not present

## 2017-04-05 DIAGNOSIS — E119 Type 2 diabetes mellitus without complications: Secondary | ICD-10-CM | POA: Diagnosis not present

## 2017-04-05 DIAGNOSIS — I2510 Atherosclerotic heart disease of native coronary artery without angina pectoris: Secondary | ICD-10-CM | POA: Diagnosis not present

## 2017-04-05 DIAGNOSIS — B351 Tinea unguium: Secondary | ICD-10-CM | POA: Diagnosis not present

## 2017-04-05 DIAGNOSIS — E785 Hyperlipidemia, unspecified: Secondary | ICD-10-CM | POA: Diagnosis not present

## 2017-04-05 DIAGNOSIS — Z79891 Long term (current) use of opiate analgesic: Secondary | ICD-10-CM | POA: Diagnosis not present

## 2017-04-05 DIAGNOSIS — M79673 Pain in unspecified foot: Secondary | ICD-10-CM | POA: Diagnosis not present

## 2017-04-05 DIAGNOSIS — W19XXXD Unspecified fall, subsequent encounter: Secondary | ICD-10-CM | POA: Diagnosis not present

## 2017-04-05 DIAGNOSIS — B070 Plantar wart: Secondary | ICD-10-CM | POA: Diagnosis not present

## 2017-04-08 ENCOUNTER — Telehealth (INDEPENDENT_AMBULATORY_CARE_PROVIDER_SITE_OTHER): Payer: Self-pay | Admitting: Orthopaedic Surgery

## 2017-04-08 ENCOUNTER — Telehealth (INDEPENDENT_AMBULATORY_CARE_PROVIDER_SITE_OTHER): Payer: Self-pay | Admitting: *Deleted

## 2017-04-08 DIAGNOSIS — S82841D Displaced bimalleolar fracture of right lower leg, subsequent encounter for closed fracture with routine healing: Secondary | ICD-10-CM | POA: Diagnosis not present

## 2017-04-08 DIAGNOSIS — I2510 Atherosclerotic heart disease of native coronary artery without angina pectoris: Secondary | ICD-10-CM | POA: Diagnosis not present

## 2017-04-08 DIAGNOSIS — Z955 Presence of coronary angioplasty implant and graft: Secondary | ICD-10-CM | POA: Diagnosis not present

## 2017-04-08 DIAGNOSIS — Z87440 Personal history of urinary (tract) infections: Secondary | ICD-10-CM | POA: Diagnosis not present

## 2017-04-08 DIAGNOSIS — Z9181 History of falling: Secondary | ICD-10-CM | POA: Diagnosis not present

## 2017-04-08 DIAGNOSIS — E119 Type 2 diabetes mellitus without complications: Secondary | ICD-10-CM | POA: Diagnosis not present

## 2017-04-08 DIAGNOSIS — Z79891 Long term (current) use of opiate analgesic: Secondary | ICD-10-CM | POA: Diagnosis not present

## 2017-04-08 DIAGNOSIS — W19XXXD Unspecified fall, subsequent encounter: Secondary | ICD-10-CM | POA: Diagnosis not present

## 2017-04-08 DIAGNOSIS — I10 Essential (primary) hypertension: Secondary | ICD-10-CM | POA: Diagnosis not present

## 2017-04-08 DIAGNOSIS — Z7984 Long term (current) use of oral hypoglycemic drugs: Secondary | ICD-10-CM | POA: Diagnosis not present

## 2017-04-08 DIAGNOSIS — E785 Hyperlipidemia, unspecified: Secondary | ICD-10-CM | POA: Diagnosis not present

## 2017-04-08 NOTE — Telephone Encounter (Signed)
Physical therapy for 2 times a week for up to 8 weeks: for [facility] strengthen & balancing.

## 2017-04-08 NOTE — Telephone Encounter (Signed)
Received call from [place] OT at [facility] needing VO for OT 1/week for 1 week and 2/week for 2 weeks for upper body strengthening and cognition.  Call back # [phone]

## 2017-04-08 NOTE — Telephone Encounter (Signed)
Dorian, PT would like to know also if they can do any active or passive range of motion on the ankle for patient.  Can you please advise since Dr. Ophelia Charter is out of the office? Thanks.

## 2017-04-08 NOTE — Telephone Encounter (Signed)
Ok for verbal orders ?

## 2017-04-08 NOTE — Telephone Encounter (Signed)
Ok thanks 

## 2017-04-08 NOTE — Telephone Encounter (Signed)
I called Dorian and advised.

## 2017-04-12 NOTE — Telephone Encounter (Signed)
I called Loretta [name] and advised.

## 2017-04-12 NOTE — Telephone Encounter (Signed)
Ok for verbal 

## 2017-04-12 NOTE — Telephone Encounter (Signed)
OK - thanks

## 2017-04-13 DIAGNOSIS — Z9181 History of falling: Secondary | ICD-10-CM | POA: Diagnosis not present

## 2017-04-13 DIAGNOSIS — Z79891 Long term (current) use of opiate analgesic: Secondary | ICD-10-CM | POA: Diagnosis not present

## 2017-04-13 DIAGNOSIS — W19XXXD Unspecified fall, subsequent encounter: Secondary | ICD-10-CM | POA: Diagnosis not present

## 2017-04-13 DIAGNOSIS — Z955 Presence of coronary angioplasty implant and graft: Secondary | ICD-10-CM | POA: Diagnosis not present

## 2017-04-13 DIAGNOSIS — Z7984 Long term (current) use of oral hypoglycemic drugs: Secondary | ICD-10-CM | POA: Diagnosis not present

## 2017-04-13 DIAGNOSIS — E785 Hyperlipidemia, unspecified: Secondary | ICD-10-CM | POA: Diagnosis not present

## 2017-04-13 DIAGNOSIS — I2510 Atherosclerotic heart disease of native coronary artery without angina pectoris: Secondary | ICD-10-CM | POA: Diagnosis not present

## 2017-04-13 DIAGNOSIS — S82841D Displaced bimalleolar fracture of right lower leg, subsequent encounter for closed fracture with routine healing: Secondary | ICD-10-CM | POA: Diagnosis not present

## 2017-04-13 DIAGNOSIS — Z87440 Personal history of urinary (tract) infections: Secondary | ICD-10-CM | POA: Diagnosis not present

## 2017-04-13 DIAGNOSIS — I10 Essential (primary) hypertension: Secondary | ICD-10-CM | POA: Diagnosis not present

## 2017-04-13 DIAGNOSIS — E119 Type 2 diabetes mellitus without complications: Secondary | ICD-10-CM | POA: Diagnosis not present

## 2017-04-16 DIAGNOSIS — I2510 Atherosclerotic heart disease of native coronary artery without angina pectoris: Secondary | ICD-10-CM | POA: Diagnosis not present

## 2017-04-16 DIAGNOSIS — E785 Hyperlipidemia, unspecified: Secondary | ICD-10-CM | POA: Diagnosis not present

## 2017-04-16 DIAGNOSIS — Z955 Presence of coronary angioplasty implant and graft: Secondary | ICD-10-CM | POA: Diagnosis not present

## 2017-04-16 DIAGNOSIS — Z87440 Personal history of urinary (tract) infections: Secondary | ICD-10-CM | POA: Diagnosis not present

## 2017-04-16 DIAGNOSIS — W19XXXD Unspecified fall, subsequent encounter: Secondary | ICD-10-CM | POA: Diagnosis not present

## 2017-04-16 DIAGNOSIS — E119 Type 2 diabetes mellitus without complications: Secondary | ICD-10-CM | POA: Diagnosis not present

## 2017-04-16 DIAGNOSIS — Z9181 History of falling: Secondary | ICD-10-CM | POA: Diagnosis not present

## 2017-04-16 DIAGNOSIS — Z7984 Long term (current) use of oral hypoglycemic drugs: Secondary | ICD-10-CM | POA: Diagnosis not present

## 2017-04-16 DIAGNOSIS — Z79891 Long term (current) use of opiate analgesic: Secondary | ICD-10-CM | POA: Diagnosis not present

## 2017-04-16 DIAGNOSIS — I10 Essential (primary) hypertension: Secondary | ICD-10-CM | POA: Diagnosis not present

## 2017-04-16 DIAGNOSIS — S82841D Displaced bimalleolar fracture of right lower leg, subsequent encounter for closed fracture with routine healing: Secondary | ICD-10-CM | POA: Diagnosis not present

## 2017-04-19 DIAGNOSIS — Z955 Presence of coronary angioplasty implant and graft: Secondary | ICD-10-CM | POA: Diagnosis not present

## 2017-04-19 DIAGNOSIS — Z9181 History of falling: Secondary | ICD-10-CM | POA: Diagnosis not present

## 2017-04-19 DIAGNOSIS — I2510 Atherosclerotic heart disease of native coronary artery without angina pectoris: Secondary | ICD-10-CM | POA: Diagnosis not present

## 2017-04-19 DIAGNOSIS — S82841D Displaced bimalleolar fracture of right lower leg, subsequent encounter for closed fracture with routine healing: Secondary | ICD-10-CM | POA: Diagnosis not present

## 2017-04-19 DIAGNOSIS — E785 Hyperlipidemia, unspecified: Secondary | ICD-10-CM | POA: Diagnosis not present

## 2017-04-19 DIAGNOSIS — W19XXXD Unspecified fall, subsequent encounter: Secondary | ICD-10-CM | POA: Diagnosis not present

## 2017-04-19 DIAGNOSIS — E119 Type 2 diabetes mellitus without complications: Secondary | ICD-10-CM | POA: Diagnosis not present

## 2017-04-19 DIAGNOSIS — I10 Essential (primary) hypertension: Secondary | ICD-10-CM | POA: Diagnosis not present

## 2017-04-19 DIAGNOSIS — Z7984 Long term (current) use of oral hypoglycemic drugs: Secondary | ICD-10-CM | POA: Diagnosis not present

## 2017-04-19 DIAGNOSIS — Z79891 Long term (current) use of opiate analgesic: Secondary | ICD-10-CM | POA: Diagnosis not present

## 2017-04-19 DIAGNOSIS — Z87440 Personal history of urinary (tract) infections: Secondary | ICD-10-CM | POA: Diagnosis not present

## 2017-04-20 ENCOUNTER — Encounter (INDEPENDENT_AMBULATORY_CARE_PROVIDER_SITE_OTHER): Payer: Self-pay | Admitting: Orthopaedic Surgery

## 2017-04-20 ENCOUNTER — Ambulatory Visit (INDEPENDENT_AMBULATORY_CARE_PROVIDER_SITE_OTHER): Payer: Medicare Other | Admitting: Orthopaedic Surgery

## 2017-04-20 ENCOUNTER — Ambulatory Visit (INDEPENDENT_AMBULATORY_CARE_PROVIDER_SITE_OTHER): Payer: Medicare Other

## 2017-04-20 VITALS — BP 180/78 | HR 58 | Ht 68.0 in | Wt 250.0 lb

## 2017-04-20 DIAGNOSIS — S8261XD Displaced fracture of lateral malleolus of right fibula, subsequent encounter for closed fracture with routine healing: Secondary | ICD-10-CM | POA: Diagnosis not present

## 2017-04-20 NOTE — Progress Notes (Signed)
Office Visit Note   Patient: Loretta [name]           Date of Birth: [birth year]           MRN: [id number] Visit Date: 04/20/2017              Requested by: Darci Needle, MD [address] [place], [place] [address] PCP: Michiel Sites, MD   Assessment & Plan: Visit Diagnoses:  1. Displaced fracture of lateral malleolus of right fibula, subsequent encounter for closed fracture with routine healing     Plan: Patient can the DC the cam boot or fracture is completely healed. She can go into tennis shoes use her walker needs to increase her walking to increase her strength and stamina and decrease her fall risk. She was doing some pool exercises before her fall but basically wasn't doing much walking and over. Of several years cut severely deconditioned. Her fracture is healed and we will check her back again on a when necessary basis.  Follow-Up Instructions: No Follow-up on file.   Orders:  Orders Placed This Encounter  Procedures  . XR Ankle Complete Right   No orders of the defined types were placed in this encounter.     Procedures: No procedures performed   Clinical Data: No additional findings.   Subjective: Chief Complaint  Patient presents with  . Right Ankle - Routine Post Op  . Right Knee - Pain, Follow-up    HPI patient over 3 months out from the right ankle fracture. She still insisted living and has been making the 3 laps in the skilled facility twice a day to try to increase her stamina. She still using a walker. She is unable to go up steps but can use her walker and walk up a ramp.. She appreciates the help of assisted living but really wants to get back home.  Review of Systems 14 point review of systems updated and is unchanged from last office visit other than as above in history of present illness.   Objective: Vital Signs: BP (!) 180/78   Pulse (!) 58   Ht  (1.727 m)   Wt 250 lb (113.4 kg)   BMI 38.01 kg/m   Physical  Exam  Constitutional: She is oriented to person, place, and time. She appears well-developed.  HENT:  Head: Normocephalic.  Right Ear: External ear normal.  Left Ear: External ear normal.  Eyes: Pupils are equal, round, and reactive to light.  Neck: No tracheal deviation present. No thyromegaly present.  Cardiovascular: Normal rate.   Pulmonary/Chest: Effort normal.  Abdominal: Soft.  Musculoskeletal:  Patient's been immature in a cam boot has the improvement in her knee range of motion post knee injection. No rash of her exposed skin good capillary refill to toes. She has overall decreased the strength upper and lower extremities with deconditioning.  Neurological: She is alert and oriented to person, place, and time.  Skin: Skin is warm and dry.  Psychiatric: She has a normal mood and affect. Her behavior is normal.    Ortho Exam  Specialty Comments:  No specialty comments available.  Imaging: Xr Ankle Complete Right  Result Date: 04/20/2017 Review x-rays left ankle obtained which shows complete healing of the fibular fracture on all 3 planes. Ankle as well reduced no degenerative changes. Fibular plate is identified and fibula fracture is healed. Impression: Healed ankle fracture post-ORIF 01/04/2017.    PMFS History: Patient Active Problem List   Diagnosis Date  Noted  . Closed right ankle fracture 01/04/2017  . Displaced fracture of lateral malleolus of right fibula, subsequent encounter for closed fracture with routine healing   . Bimalleolar ankle fracture 12/15/2016  . DM2 (diabetes mellitus, type 2) (HCC) 12/15/2016  . HTN (hypertension) 12/15/2016  . Hypokalemia 12/15/2016  . Hypernatremia 12/15/2016   Past Medical History:  Diagnosis Date  . Diabetes mellitus without complication (HCC)    Type  . Hyperlipidemia   . Hypertension     Family History  Problem Relation Age of Onset  . Osteoporosis Neg Hx     Past Surgical History:  Procedure Laterality Date    . ORIF ANKLE FRACTURE Right 01/04/2017   Procedure: OPEN REDUCTION INTERNAL FIXATION (ORIF) RIGHT ANKLE FRACTURE;  Surgeon: Eldred Manges, MD;  Location: [facility] OR;  Service: Orthopedics;  Laterality: Right;   Social History   Occupational History  . Not on file.   Social History Main Topics  . Smoking status: Never Smoker  . Smokeless tobacco: Never Used  . Alcohol use No  . Drug use: No  . Sexual activity: Not on file

## 2017-04-21 ENCOUNTER — Ambulatory Visit (INDEPENDENT_AMBULATORY_CARE_PROVIDER_SITE_OTHER): Payer: Medicare Other | Admitting: Orthopaedic Surgery

## 2017-04-21 DIAGNOSIS — Z87440 Personal history of urinary (tract) infections: Secondary | ICD-10-CM | POA: Diagnosis not present

## 2017-04-22 DIAGNOSIS — Z87440 Personal history of urinary (tract) infections: Secondary | ICD-10-CM | POA: Diagnosis not present

## 2017-04-22 DIAGNOSIS — Z7984 Long term (current) use of oral hypoglycemic drugs: Secondary | ICD-10-CM | POA: Diagnosis not present

## 2017-04-22 DIAGNOSIS — Z955 Presence of coronary angioplasty implant and graft: Secondary | ICD-10-CM | POA: Diagnosis not present

## 2017-04-22 DIAGNOSIS — I2510 Atherosclerotic heart disease of native coronary artery without angina pectoris: Secondary | ICD-10-CM | POA: Diagnosis not present

## 2017-04-22 DIAGNOSIS — Z9181 History of falling: Secondary | ICD-10-CM | POA: Diagnosis not present

## 2017-04-22 DIAGNOSIS — S82841D Displaced bimalleolar fracture of right lower leg, subsequent encounter for closed fracture with routine healing: Secondary | ICD-10-CM | POA: Diagnosis not present

## 2017-04-22 DIAGNOSIS — E119 Type 2 diabetes mellitus without complications: Secondary | ICD-10-CM | POA: Diagnosis not present

## 2017-04-22 DIAGNOSIS — E785 Hyperlipidemia, unspecified: Secondary | ICD-10-CM | POA: Diagnosis not present

## 2017-04-22 DIAGNOSIS — I10 Essential (primary) hypertension: Secondary | ICD-10-CM | POA: Diagnosis not present

## 2017-04-22 DIAGNOSIS — W19XXXD Unspecified fall, subsequent encounter: Secondary | ICD-10-CM | POA: Diagnosis not present

## 2017-04-22 DIAGNOSIS — Z79891 Long term (current) use of opiate analgesic: Secondary | ICD-10-CM | POA: Diagnosis not present

## 2017-04-23 DIAGNOSIS — I10 Essential (primary) hypertension: Secondary | ICD-10-CM | POA: Diagnosis not present

## 2017-04-23 DIAGNOSIS — Z79891 Long term (current) use of opiate analgesic: Secondary | ICD-10-CM | POA: Diagnosis not present

## 2017-04-23 DIAGNOSIS — E119 Type 2 diabetes mellitus without complications: Secondary | ICD-10-CM | POA: Diagnosis not present

## 2017-04-23 DIAGNOSIS — I2510 Atherosclerotic heart disease of native coronary artery without angina pectoris: Secondary | ICD-10-CM | POA: Diagnosis not present

## 2017-04-23 DIAGNOSIS — Z87440 Personal history of urinary (tract) infections: Secondary | ICD-10-CM | POA: Diagnosis not present

## 2017-04-23 DIAGNOSIS — Z9181 History of falling: Secondary | ICD-10-CM | POA: Diagnosis not present

## 2017-04-23 DIAGNOSIS — S82841D Displaced bimalleolar fracture of right lower leg, subsequent encounter for closed fracture with routine healing: Secondary | ICD-10-CM | POA: Diagnosis not present

## 2017-04-23 DIAGNOSIS — W19XXXD Unspecified fall, subsequent encounter: Secondary | ICD-10-CM | POA: Diagnosis not present

## 2017-04-23 DIAGNOSIS — Z7984 Long term (current) use of oral hypoglycemic drugs: Secondary | ICD-10-CM | POA: Diagnosis not present

## 2017-04-23 DIAGNOSIS — E785 Hyperlipidemia, unspecified: Secondary | ICD-10-CM | POA: Diagnosis not present

## 2017-04-23 DIAGNOSIS — Z955 Presence of coronary angioplasty implant and graft: Secondary | ICD-10-CM | POA: Diagnosis not present

## 2017-04-26 DIAGNOSIS — Z7984 Long term (current) use of oral hypoglycemic drugs: Secondary | ICD-10-CM | POA: Diagnosis not present

## 2017-04-26 DIAGNOSIS — E785 Hyperlipidemia, unspecified: Secondary | ICD-10-CM | POA: Diagnosis not present

## 2017-04-26 DIAGNOSIS — R2681 Unsteadiness on feet: Secondary | ICD-10-CM | POA: Diagnosis not present

## 2017-04-26 DIAGNOSIS — E119 Type 2 diabetes mellitus without complications: Secondary | ICD-10-CM | POA: Diagnosis not present

## 2017-04-26 DIAGNOSIS — Z79891 Long term (current) use of opiate analgesic: Secondary | ICD-10-CM | POA: Diagnosis not present

## 2017-04-26 DIAGNOSIS — Z87440 Personal history of urinary (tract) infections: Secondary | ICD-10-CM | POA: Diagnosis not present

## 2017-04-26 DIAGNOSIS — I10 Essential (primary) hypertension: Secondary | ICD-10-CM | POA: Diagnosis not present

## 2017-04-26 DIAGNOSIS — W19XXXD Unspecified fall, subsequent encounter: Secondary | ICD-10-CM | POA: Diagnosis not present

## 2017-04-26 DIAGNOSIS — Z9181 History of falling: Secondary | ICD-10-CM | POA: Diagnosis not present

## 2017-04-26 DIAGNOSIS — Z955 Presence of coronary angioplasty implant and graft: Secondary | ICD-10-CM | POA: Diagnosis not present

## 2017-04-26 DIAGNOSIS — S82841D Displaced bimalleolar fracture of right lower leg, subsequent encounter for closed fracture with routine healing: Secondary | ICD-10-CM | POA: Diagnosis not present

## 2017-04-26 DIAGNOSIS — I2510 Atherosclerotic heart disease of native coronary artery without angina pectoris: Secondary | ICD-10-CM | POA: Diagnosis not present

## 2017-04-29 DIAGNOSIS — Z7984 Long term (current) use of oral hypoglycemic drugs: Secondary | ICD-10-CM | POA: Diagnosis not present

## 2017-04-29 DIAGNOSIS — Z9181 History of falling: Secondary | ICD-10-CM | POA: Diagnosis not present

## 2017-04-29 DIAGNOSIS — Z79891 Long term (current) use of opiate analgesic: Secondary | ICD-10-CM | POA: Diagnosis not present

## 2017-04-29 DIAGNOSIS — E785 Hyperlipidemia, unspecified: Secondary | ICD-10-CM | POA: Diagnosis not present

## 2017-04-29 DIAGNOSIS — Z955 Presence of coronary angioplasty implant and graft: Secondary | ICD-10-CM | POA: Diagnosis not present

## 2017-04-29 DIAGNOSIS — S82841D Displaced bimalleolar fracture of right lower leg, subsequent encounter for closed fracture with routine healing: Secondary | ICD-10-CM | POA: Diagnosis not present

## 2017-04-29 DIAGNOSIS — E119 Type 2 diabetes mellitus without complications: Secondary | ICD-10-CM | POA: Diagnosis not present

## 2017-04-29 DIAGNOSIS — I2510 Atherosclerotic heart disease of native coronary artery without angina pectoris: Secondary | ICD-10-CM | POA: Diagnosis not present

## 2017-04-29 DIAGNOSIS — I10 Essential (primary) hypertension: Secondary | ICD-10-CM | POA: Diagnosis not present

## 2017-04-29 DIAGNOSIS — W19XXXD Unspecified fall, subsequent encounter: Secondary | ICD-10-CM | POA: Diagnosis not present

## 2017-04-29 DIAGNOSIS — Z87440 Personal history of urinary (tract) infections: Secondary | ICD-10-CM | POA: Diagnosis not present

## 2017-04-30 DIAGNOSIS — M6281 Muscle weakness (generalized): Secondary | ICD-10-CM | POA: Diagnosis not present

## 2017-04-30 DIAGNOSIS — R269 Unspecified abnormalities of gait and mobility: Secondary | ICD-10-CM | POA: Diagnosis not present

## 2017-05-04 DIAGNOSIS — S82841D Displaced bimalleolar fracture of right lower leg, subsequent encounter for closed fracture with routine healing: Secondary | ICD-10-CM | POA: Diagnosis not present

## 2017-05-04 DIAGNOSIS — E785 Hyperlipidemia, unspecified: Secondary | ICD-10-CM | POA: Diagnosis not present

## 2017-05-04 DIAGNOSIS — I10 Essential (primary) hypertension: Secondary | ICD-10-CM | POA: Diagnosis not present

## 2017-05-04 DIAGNOSIS — W19XXXD Unspecified fall, subsequent encounter: Secondary | ICD-10-CM | POA: Diagnosis not present

## 2017-05-04 DIAGNOSIS — Z9181 History of falling: Secondary | ICD-10-CM | POA: Diagnosis not present

## 2017-05-04 DIAGNOSIS — Z79891 Long term (current) use of opiate analgesic: Secondary | ICD-10-CM | POA: Diagnosis not present

## 2017-05-04 DIAGNOSIS — Z87440 Personal history of urinary (tract) infections: Secondary | ICD-10-CM | POA: Diagnosis not present

## 2017-05-04 DIAGNOSIS — E119 Type 2 diabetes mellitus without complications: Secondary | ICD-10-CM | POA: Diagnosis not present

## 2017-05-04 DIAGNOSIS — Z955 Presence of coronary angioplasty implant and graft: Secondary | ICD-10-CM | POA: Diagnosis not present

## 2017-05-04 DIAGNOSIS — Z7984 Long term (current) use of oral hypoglycemic drugs: Secondary | ICD-10-CM | POA: Diagnosis not present

## 2017-05-04 DIAGNOSIS — I2510 Atherosclerotic heart disease of native coronary artery without angina pectoris: Secondary | ICD-10-CM | POA: Diagnosis not present

## 2017-05-06 DIAGNOSIS — E785 Hyperlipidemia, unspecified: Secondary | ICD-10-CM | POA: Diagnosis not present

## 2017-05-06 DIAGNOSIS — I10 Essential (primary) hypertension: Secondary | ICD-10-CM | POA: Diagnosis not present

## 2017-05-06 DIAGNOSIS — W19XXXD Unspecified fall, subsequent encounter: Secondary | ICD-10-CM | POA: Diagnosis not present

## 2017-05-06 DIAGNOSIS — E119 Type 2 diabetes mellitus without complications: Secondary | ICD-10-CM | POA: Diagnosis not present

## 2017-05-06 DIAGNOSIS — Z7984 Long term (current) use of oral hypoglycemic drugs: Secondary | ICD-10-CM | POA: Diagnosis not present

## 2017-05-06 DIAGNOSIS — Z79891 Long term (current) use of opiate analgesic: Secondary | ICD-10-CM | POA: Diagnosis not present

## 2017-05-06 DIAGNOSIS — Z9181 History of falling: Secondary | ICD-10-CM | POA: Diagnosis not present

## 2017-05-06 DIAGNOSIS — I2510 Atherosclerotic heart disease of native coronary artery without angina pectoris: Secondary | ICD-10-CM | POA: Diagnosis not present

## 2017-05-06 DIAGNOSIS — Z955 Presence of coronary angioplasty implant and graft: Secondary | ICD-10-CM | POA: Diagnosis not present

## 2017-05-06 DIAGNOSIS — S82841D Displaced bimalleolar fracture of right lower leg, subsequent encounter for closed fracture with routine healing: Secondary | ICD-10-CM | POA: Diagnosis not present

## 2017-05-06 DIAGNOSIS — Z87440 Personal history of urinary (tract) infections: Secondary | ICD-10-CM | POA: Diagnosis not present

## 2017-05-10 DIAGNOSIS — I2510 Atherosclerotic heart disease of native coronary artery without angina pectoris: Secondary | ICD-10-CM | POA: Diagnosis not present

## 2017-05-10 DIAGNOSIS — S82841D Displaced bimalleolar fracture of right lower leg, subsequent encounter for closed fracture with routine healing: Secondary | ICD-10-CM | POA: Diagnosis not present

## 2017-05-10 DIAGNOSIS — E119 Type 2 diabetes mellitus without complications: Secondary | ICD-10-CM | POA: Diagnosis not present

## 2017-05-10 DIAGNOSIS — Z87440 Personal history of urinary (tract) infections: Secondary | ICD-10-CM | POA: Diagnosis not present

## 2017-05-10 DIAGNOSIS — W19XXXD Unspecified fall, subsequent encounter: Secondary | ICD-10-CM | POA: Diagnosis not present

## 2017-05-10 DIAGNOSIS — Z79891 Long term (current) use of opiate analgesic: Secondary | ICD-10-CM | POA: Diagnosis not present

## 2017-05-10 DIAGNOSIS — Z955 Presence of coronary angioplasty implant and graft: Secondary | ICD-10-CM | POA: Diagnosis not present

## 2017-05-10 DIAGNOSIS — Z7984 Long term (current) use of oral hypoglycemic drugs: Secondary | ICD-10-CM | POA: Diagnosis not present

## 2017-05-10 DIAGNOSIS — E785 Hyperlipidemia, unspecified: Secondary | ICD-10-CM | POA: Diagnosis not present

## 2017-05-10 DIAGNOSIS — I10 Essential (primary) hypertension: Secondary | ICD-10-CM | POA: Diagnosis not present

## 2017-05-10 DIAGNOSIS — Z9181 History of falling: Secondary | ICD-10-CM | POA: Diagnosis not present

## 2017-05-11 DIAGNOSIS — E119 Type 2 diabetes mellitus without complications: Secondary | ICD-10-CM | POA: Diagnosis not present

## 2017-05-12 DIAGNOSIS — I10 Essential (primary) hypertension: Secondary | ICD-10-CM | POA: Diagnosis not present

## 2017-05-12 DIAGNOSIS — E119 Type 2 diabetes mellitus without complications: Secondary | ICD-10-CM | POA: Diagnosis not present

## 2017-05-12 DIAGNOSIS — Z79891 Long term (current) use of opiate analgesic: Secondary | ICD-10-CM | POA: Diagnosis not present

## 2017-05-12 DIAGNOSIS — Z955 Presence of coronary angioplasty implant and graft: Secondary | ICD-10-CM | POA: Diagnosis not present

## 2017-05-12 DIAGNOSIS — Z87440 Personal history of urinary (tract) infections: Secondary | ICD-10-CM | POA: Diagnosis not present

## 2017-05-12 DIAGNOSIS — Z9181 History of falling: Secondary | ICD-10-CM | POA: Diagnosis not present

## 2017-05-12 DIAGNOSIS — I2510 Atherosclerotic heart disease of native coronary artery without angina pectoris: Secondary | ICD-10-CM | POA: Diagnosis not present

## 2017-05-12 DIAGNOSIS — Z7984 Long term (current) use of oral hypoglycemic drugs: Secondary | ICD-10-CM | POA: Diagnosis not present

## 2017-05-12 DIAGNOSIS — E785 Hyperlipidemia, unspecified: Secondary | ICD-10-CM | POA: Diagnosis not present

## 2017-05-12 DIAGNOSIS — W19XXXD Unspecified fall, subsequent encounter: Secondary | ICD-10-CM | POA: Diagnosis not present

## 2017-05-12 DIAGNOSIS — S82841D Displaced bimalleolar fracture of right lower leg, subsequent encounter for closed fracture with routine healing: Secondary | ICD-10-CM | POA: Diagnosis not present

## 2017-05-18 DIAGNOSIS — E785 Hyperlipidemia, unspecified: Secondary | ICD-10-CM | POA: Diagnosis not present

## 2017-05-18 DIAGNOSIS — Z79891 Long term (current) use of opiate analgesic: Secondary | ICD-10-CM | POA: Diagnosis not present

## 2017-05-18 DIAGNOSIS — Z87440 Personal history of urinary (tract) infections: Secondary | ICD-10-CM | POA: Diagnosis not present

## 2017-05-18 DIAGNOSIS — Z7984 Long term (current) use of oral hypoglycemic drugs: Secondary | ICD-10-CM | POA: Diagnosis not present

## 2017-05-18 DIAGNOSIS — I10 Essential (primary) hypertension: Secondary | ICD-10-CM | POA: Diagnosis not present

## 2017-05-18 DIAGNOSIS — Z9181 History of falling: Secondary | ICD-10-CM | POA: Diagnosis not present

## 2017-05-18 DIAGNOSIS — S82841D Displaced bimalleolar fracture of right lower leg, subsequent encounter for closed fracture with routine healing: Secondary | ICD-10-CM | POA: Diagnosis not present

## 2017-05-18 DIAGNOSIS — W19XXXD Unspecified fall, subsequent encounter: Secondary | ICD-10-CM | POA: Diagnosis not present

## 2017-05-18 DIAGNOSIS — E119 Type 2 diabetes mellitus without complications: Secondary | ICD-10-CM | POA: Diagnosis not present

## 2017-05-18 DIAGNOSIS — I2510 Atherosclerotic heart disease of native coronary artery without angina pectoris: Secondary | ICD-10-CM | POA: Diagnosis not present

## 2017-05-18 DIAGNOSIS — Z955 Presence of coronary angioplasty implant and graft: Secondary | ICD-10-CM | POA: Diagnosis not present

## 2017-05-21 DIAGNOSIS — E785 Hyperlipidemia, unspecified: Secondary | ICD-10-CM | POA: Diagnosis not present

## 2017-05-21 DIAGNOSIS — E119 Type 2 diabetes mellitus without complications: Secondary | ICD-10-CM | POA: Diagnosis not present

## 2017-05-21 DIAGNOSIS — I2510 Atherosclerotic heart disease of native coronary artery without angina pectoris: Secondary | ICD-10-CM | POA: Diagnosis not present

## 2017-05-21 DIAGNOSIS — Z7984 Long term (current) use of oral hypoglycemic drugs: Secondary | ICD-10-CM | POA: Diagnosis not present

## 2017-05-21 DIAGNOSIS — Z9181 History of falling: Secondary | ICD-10-CM | POA: Diagnosis not present

## 2017-05-21 DIAGNOSIS — Z79891 Long term (current) use of opiate analgesic: Secondary | ICD-10-CM | POA: Diagnosis not present

## 2017-05-21 DIAGNOSIS — S82841D Displaced bimalleolar fracture of right lower leg, subsequent encounter for closed fracture with routine healing: Secondary | ICD-10-CM | POA: Diagnosis not present

## 2017-05-21 DIAGNOSIS — W19XXXD Unspecified fall, subsequent encounter: Secondary | ICD-10-CM | POA: Diagnosis not present

## 2017-05-21 DIAGNOSIS — Z87440 Personal history of urinary (tract) infections: Secondary | ICD-10-CM | POA: Diagnosis not present

## 2017-05-21 DIAGNOSIS — I10 Essential (primary) hypertension: Secondary | ICD-10-CM | POA: Diagnosis not present

## 2017-05-21 DIAGNOSIS — Z955 Presence of coronary angioplasty implant and graft: Secondary | ICD-10-CM | POA: Diagnosis not present

## 2017-05-24 DIAGNOSIS — Z79891 Long term (current) use of opiate analgesic: Secondary | ICD-10-CM | POA: Diagnosis not present

## 2017-05-24 DIAGNOSIS — W19XXXD Unspecified fall, subsequent encounter: Secondary | ICD-10-CM | POA: Diagnosis not present

## 2017-05-24 DIAGNOSIS — S82841D Displaced bimalleolar fracture of right lower leg, subsequent encounter for closed fracture with routine healing: Secondary | ICD-10-CM | POA: Diagnosis not present

## 2017-05-24 DIAGNOSIS — I2510 Atherosclerotic heart disease of native coronary artery without angina pectoris: Secondary | ICD-10-CM | POA: Diagnosis not present

## 2017-05-24 DIAGNOSIS — Z9181 History of falling: Secondary | ICD-10-CM | POA: Diagnosis not present

## 2017-05-24 DIAGNOSIS — I10 Essential (primary) hypertension: Secondary | ICD-10-CM | POA: Diagnosis not present

## 2017-05-24 DIAGNOSIS — E785 Hyperlipidemia, unspecified: Secondary | ICD-10-CM | POA: Diagnosis not present

## 2017-05-24 DIAGNOSIS — Z87440 Personal history of urinary (tract) infections: Secondary | ICD-10-CM | POA: Diagnosis not present

## 2017-05-24 DIAGNOSIS — Z955 Presence of coronary angioplasty implant and graft: Secondary | ICD-10-CM | POA: Diagnosis not present

## 2017-05-24 DIAGNOSIS — E119 Type 2 diabetes mellitus without complications: Secondary | ICD-10-CM | POA: Diagnosis not present

## 2017-05-24 DIAGNOSIS — Z7984 Long term (current) use of oral hypoglycemic drugs: Secondary | ICD-10-CM | POA: Diagnosis not present

## 2017-05-28 ENCOUNTER — Telehealth (INDEPENDENT_AMBULATORY_CARE_PROVIDER_SITE_OTHER): Payer: Self-pay | Admitting: Orthopaedic Surgery

## 2017-05-28 DIAGNOSIS — Z87440 Personal history of urinary (tract) infections: Secondary | ICD-10-CM | POA: Diagnosis not present

## 2017-05-28 DIAGNOSIS — W19XXXD Unspecified fall, subsequent encounter: Secondary | ICD-10-CM | POA: Diagnosis not present

## 2017-05-28 DIAGNOSIS — Z9181 History of falling: Secondary | ICD-10-CM | POA: Diagnosis not present

## 2017-05-28 DIAGNOSIS — Z79891 Long term (current) use of opiate analgesic: Secondary | ICD-10-CM | POA: Diagnosis not present

## 2017-05-28 DIAGNOSIS — I2510 Atherosclerotic heart disease of native coronary artery without angina pectoris: Secondary | ICD-10-CM | POA: Diagnosis not present

## 2017-05-28 DIAGNOSIS — E119 Type 2 diabetes mellitus without complications: Secondary | ICD-10-CM | POA: Diagnosis not present

## 2017-05-28 DIAGNOSIS — S82841D Displaced bimalleolar fracture of right lower leg, subsequent encounter for closed fracture with routine healing: Secondary | ICD-10-CM | POA: Diagnosis not present

## 2017-05-28 DIAGNOSIS — I10 Essential (primary) hypertension: Secondary | ICD-10-CM | POA: Diagnosis not present

## 2017-05-28 DIAGNOSIS — Z955 Presence of coronary angioplasty implant and graft: Secondary | ICD-10-CM | POA: Diagnosis not present

## 2017-05-28 DIAGNOSIS — Z7984 Long term (current) use of oral hypoglycemic drugs: Secondary | ICD-10-CM | POA: Diagnosis not present

## 2017-05-28 DIAGNOSIS — E785 Hyperlipidemia, unspecified: Secondary | ICD-10-CM | POA: Diagnosis not present

## 2017-05-28 NOTE — Addendum Note (Signed)
Addendum  created 05/28/17 0920 by Tyress Loden, MD   Sign clinical note    

## 2017-05-28 NOTE — Telephone Encounter (Signed)
Ok for verbal orders ?

## 2017-05-28 NOTE — Telephone Encounter (Signed)
Loretta [name] (PT) with [facility] called needing orders to continue to see patient for (PT) 3wk 1. Patient will be discharged after that. The number to contact Loretta [name] is  [phone]

## 2017-05-31 DIAGNOSIS — Z9181 History of falling: Secondary | ICD-10-CM | POA: Diagnosis not present

## 2017-05-31 DIAGNOSIS — I2510 Atherosclerotic heart disease of native coronary artery without angina pectoris: Secondary | ICD-10-CM | POA: Diagnosis not present

## 2017-05-31 DIAGNOSIS — Z87440 Personal history of urinary (tract) infections: Secondary | ICD-10-CM | POA: Diagnosis not present

## 2017-05-31 DIAGNOSIS — R269 Unspecified abnormalities of gait and mobility: Secondary | ICD-10-CM | POA: Diagnosis not present

## 2017-05-31 DIAGNOSIS — Z79891 Long term (current) use of opiate analgesic: Secondary | ICD-10-CM | POA: Diagnosis not present

## 2017-05-31 DIAGNOSIS — E119 Type 2 diabetes mellitus without complications: Secondary | ICD-10-CM | POA: Diagnosis not present

## 2017-05-31 DIAGNOSIS — Z7984 Long term (current) use of oral hypoglycemic drugs: Secondary | ICD-10-CM | POA: Diagnosis not present

## 2017-05-31 DIAGNOSIS — S82841D Displaced bimalleolar fracture of right lower leg, subsequent encounter for closed fracture with routine healing: Secondary | ICD-10-CM | POA: Diagnosis not present

## 2017-05-31 DIAGNOSIS — M6281 Muscle weakness (generalized): Secondary | ICD-10-CM | POA: Diagnosis not present

## 2017-05-31 DIAGNOSIS — Z955 Presence of coronary angioplasty implant and graft: Secondary | ICD-10-CM | POA: Diagnosis not present

## 2017-05-31 DIAGNOSIS — W19XXXD Unspecified fall, subsequent encounter: Secondary | ICD-10-CM | POA: Diagnosis not present

## 2017-05-31 DIAGNOSIS — I10 Essential (primary) hypertension: Secondary | ICD-10-CM | POA: Diagnosis not present

## 2017-05-31 DIAGNOSIS — E785 Hyperlipidemia, unspecified: Secondary | ICD-10-CM | POA: Diagnosis not present

## 2017-06-03 DIAGNOSIS — W19XXXD Unspecified fall, subsequent encounter: Secondary | ICD-10-CM | POA: Diagnosis not present

## 2017-06-03 DIAGNOSIS — Z7984 Long term (current) use of oral hypoglycemic drugs: Secondary | ICD-10-CM | POA: Diagnosis not present

## 2017-06-03 DIAGNOSIS — I10 Essential (primary) hypertension: Secondary | ICD-10-CM | POA: Diagnosis not present

## 2017-06-03 DIAGNOSIS — E785 Hyperlipidemia, unspecified: Secondary | ICD-10-CM | POA: Diagnosis not present

## 2017-06-03 DIAGNOSIS — Z955 Presence of coronary angioplasty implant and graft: Secondary | ICD-10-CM | POA: Diagnosis not present

## 2017-06-03 DIAGNOSIS — I2510 Atherosclerotic heart disease of native coronary artery without angina pectoris: Secondary | ICD-10-CM | POA: Diagnosis not present

## 2017-06-03 DIAGNOSIS — Z9181 History of falling: Secondary | ICD-10-CM | POA: Diagnosis not present

## 2017-06-03 DIAGNOSIS — E119 Type 2 diabetes mellitus without complications: Secondary | ICD-10-CM | POA: Diagnosis not present

## 2017-06-03 DIAGNOSIS — S82841D Displaced bimalleolar fracture of right lower leg, subsequent encounter for closed fracture with routine healing: Secondary | ICD-10-CM | POA: Diagnosis not present

## 2017-06-03 DIAGNOSIS — Z79891 Long term (current) use of opiate analgesic: Secondary | ICD-10-CM | POA: Diagnosis not present

## 2017-06-03 DIAGNOSIS — Z87440 Personal history of urinary (tract) infections: Secondary | ICD-10-CM | POA: Diagnosis not present

## 2017-06-30 DIAGNOSIS — R269 Unspecified abnormalities of gait and mobility: Secondary | ICD-10-CM | POA: Diagnosis not present

## 2017-06-30 DIAGNOSIS — M6281 Muscle weakness (generalized): Secondary | ICD-10-CM | POA: Diagnosis not present

## 2017-07-21 DIAGNOSIS — R300 Dysuria: Secondary | ICD-10-CM | POA: Diagnosis not present

## 2017-07-21 DIAGNOSIS — R350 Frequency of micturition: Secondary | ICD-10-CM | POA: Diagnosis not present

## 2017-07-21 DIAGNOSIS — R3915 Urgency of urination: Secondary | ICD-10-CM | POA: Diagnosis not present

## 2017-07-28 DIAGNOSIS — R609 Edema, unspecified: Secondary | ICD-10-CM | POA: Diagnosis not present

## 2017-07-30 DIAGNOSIS — E785 Hyperlipidemia, unspecified: Secondary | ICD-10-CM | POA: Diagnosis not present

## 2017-07-30 DIAGNOSIS — R609 Edema, unspecified: Secondary | ICD-10-CM | POA: Diagnosis not present

## 2017-07-30 DIAGNOSIS — I10 Essential (primary) hypertension: Secondary | ICD-10-CM | POA: Diagnosis not present

## 2017-07-30 DIAGNOSIS — E119 Type 2 diabetes mellitus without complications: Secondary | ICD-10-CM | POA: Diagnosis not present

## 2017-07-31 DIAGNOSIS — M6281 Muscle weakness (generalized): Secondary | ICD-10-CM | POA: Diagnosis not present

## 2017-07-31 DIAGNOSIS — R269 Unspecified abnormalities of gait and mobility: Secondary | ICD-10-CM | POA: Diagnosis not present

## 2017-08-05 DIAGNOSIS — R531 Weakness: Secondary | ICD-10-CM | POA: Diagnosis not present

## 2017-08-11 DIAGNOSIS — E119 Type 2 diabetes mellitus without complications: Secondary | ICD-10-CM | POA: Diagnosis not present

## 2017-08-11 DIAGNOSIS — I10 Essential (primary) hypertension: Secondary | ICD-10-CM | POA: Diagnosis not present

## 2017-08-11 DIAGNOSIS — E876 Hypokalemia: Secondary | ICD-10-CM | POA: Diagnosis not present

## 2017-08-20 DIAGNOSIS — M6281 Muscle weakness (generalized): Secondary | ICD-10-CM | POA: Diagnosis not present

## 2017-08-20 DIAGNOSIS — E876 Hypokalemia: Secondary | ICD-10-CM | POA: Diagnosis not present

## 2017-08-20 DIAGNOSIS — E119 Type 2 diabetes mellitus without complications: Secondary | ICD-10-CM | POA: Diagnosis not present

## 2017-08-31 DIAGNOSIS — M6281 Muscle weakness (generalized): Secondary | ICD-10-CM | POA: Diagnosis not present

## 2017-08-31 DIAGNOSIS — R269 Unspecified abnormalities of gait and mobility: Secondary | ICD-10-CM | POA: Diagnosis not present

## 2017-09-01 DIAGNOSIS — I10 Essential (primary) hypertension: Secondary | ICD-10-CM | POA: Diagnosis not present

## 2017-09-01 DIAGNOSIS — I2510 Atherosclerotic heart disease of native coronary artery without angina pectoris: Secondary | ICD-10-CM | POA: Diagnosis not present

## 2017-09-01 DIAGNOSIS — E785 Hyperlipidemia, unspecified: Secondary | ICD-10-CM | POA: Diagnosis not present

## 2017-09-01 DIAGNOSIS — Z7984 Long term (current) use of oral hypoglycemic drugs: Secondary | ICD-10-CM | POA: Diagnosis not present

## 2017-09-01 DIAGNOSIS — S8261XD Displaced fracture of lateral malleolus of right fibula, subsequent encounter for closed fracture with routine healing: Secondary | ICD-10-CM | POA: Diagnosis not present

## 2017-09-01 DIAGNOSIS — S82891D Other fracture of right lower leg, subsequent encounter for closed fracture with routine healing: Secondary | ICD-10-CM | POA: Diagnosis not present

## 2017-09-01 DIAGNOSIS — E119 Type 2 diabetes mellitus without complications: Secondary | ICD-10-CM | POA: Diagnosis not present

## 2017-09-01 DIAGNOSIS — Z7982 Long term (current) use of aspirin: Secondary | ICD-10-CM | POA: Diagnosis not present

## 2017-09-01 DIAGNOSIS — Z9181 History of falling: Secondary | ICD-10-CM | POA: Diagnosis not present

## 2017-09-03 DIAGNOSIS — Z9181 History of falling: Secondary | ICD-10-CM | POA: Diagnosis not present

## 2017-09-03 DIAGNOSIS — S8261XD Displaced fracture of lateral malleolus of right fibula, subsequent encounter for closed fracture with routine healing: Secondary | ICD-10-CM | POA: Diagnosis not present

## 2017-09-03 DIAGNOSIS — I10 Essential (primary) hypertension: Secondary | ICD-10-CM | POA: Diagnosis not present

## 2017-09-03 DIAGNOSIS — E785 Hyperlipidemia, unspecified: Secondary | ICD-10-CM | POA: Diagnosis not present

## 2017-09-03 DIAGNOSIS — E119 Type 2 diabetes mellitus without complications: Secondary | ICD-10-CM | POA: Diagnosis not present

## 2017-09-03 DIAGNOSIS — Z7982 Long term (current) use of aspirin: Secondary | ICD-10-CM | POA: Diagnosis not present

## 2017-09-03 DIAGNOSIS — I2510 Atherosclerotic heart disease of native coronary artery without angina pectoris: Secondary | ICD-10-CM | POA: Diagnosis not present

## 2017-09-03 DIAGNOSIS — Z7984 Long term (current) use of oral hypoglycemic drugs: Secondary | ICD-10-CM | POA: Diagnosis not present

## 2017-09-03 DIAGNOSIS — S82891D Other fracture of right lower leg, subsequent encounter for closed fracture with routine healing: Secondary | ICD-10-CM | POA: Diagnosis not present

## 2017-09-07 DIAGNOSIS — E119 Type 2 diabetes mellitus without complications: Secondary | ICD-10-CM | POA: Diagnosis not present

## 2017-09-07 DIAGNOSIS — S82891D Other fracture of right lower leg, subsequent encounter for closed fracture with routine healing: Secondary | ICD-10-CM | POA: Diagnosis not present

## 2017-09-07 DIAGNOSIS — Z9181 History of falling: Secondary | ICD-10-CM | POA: Diagnosis not present

## 2017-09-07 DIAGNOSIS — I2510 Atherosclerotic heart disease of native coronary artery without angina pectoris: Secondary | ICD-10-CM | POA: Diagnosis not present

## 2017-09-07 DIAGNOSIS — E785 Hyperlipidemia, unspecified: Secondary | ICD-10-CM | POA: Diagnosis not present

## 2017-09-07 DIAGNOSIS — I10 Essential (primary) hypertension: Secondary | ICD-10-CM | POA: Diagnosis not present

## 2017-09-07 DIAGNOSIS — Z7984 Long term (current) use of oral hypoglycemic drugs: Secondary | ICD-10-CM | POA: Diagnosis not present

## 2017-09-07 DIAGNOSIS — Z7982 Long term (current) use of aspirin: Secondary | ICD-10-CM | POA: Diagnosis not present

## 2017-09-07 DIAGNOSIS — S8261XD Displaced fracture of lateral malleolus of right fibula, subsequent encounter for closed fracture with routine healing: Secondary | ICD-10-CM | POA: Diagnosis not present

## 2017-09-10 DIAGNOSIS — I2510 Atherosclerotic heart disease of native coronary artery without angina pectoris: Secondary | ICD-10-CM | POA: Diagnosis not present

## 2017-09-10 DIAGNOSIS — Z7982 Long term (current) use of aspirin: Secondary | ICD-10-CM | POA: Diagnosis not present

## 2017-09-10 DIAGNOSIS — E119 Type 2 diabetes mellitus without complications: Secondary | ICD-10-CM | POA: Diagnosis not present

## 2017-09-10 DIAGNOSIS — Z7984 Long term (current) use of oral hypoglycemic drugs: Secondary | ICD-10-CM | POA: Diagnosis not present

## 2017-09-10 DIAGNOSIS — I10 Essential (primary) hypertension: Secondary | ICD-10-CM | POA: Diagnosis not present

## 2017-09-10 DIAGNOSIS — S8261XD Displaced fracture of lateral malleolus of right fibula, subsequent encounter for closed fracture with routine healing: Secondary | ICD-10-CM | POA: Diagnosis not present

## 2017-09-10 DIAGNOSIS — S82891D Other fracture of right lower leg, subsequent encounter for closed fracture with routine healing: Secondary | ICD-10-CM | POA: Diagnosis not present

## 2017-09-10 DIAGNOSIS — Z9181 History of falling: Secondary | ICD-10-CM | POA: Diagnosis not present

## 2017-09-10 DIAGNOSIS — E785 Hyperlipidemia, unspecified: Secondary | ICD-10-CM | POA: Diagnosis not present

## 2017-09-14 DIAGNOSIS — Z7982 Long term (current) use of aspirin: Secondary | ICD-10-CM | POA: Diagnosis not present

## 2017-09-14 DIAGNOSIS — I10 Essential (primary) hypertension: Secondary | ICD-10-CM | POA: Diagnosis not present

## 2017-09-14 DIAGNOSIS — E119 Type 2 diabetes mellitus without complications: Secondary | ICD-10-CM | POA: Diagnosis not present

## 2017-09-14 DIAGNOSIS — Z7984 Long term (current) use of oral hypoglycemic drugs: Secondary | ICD-10-CM | POA: Diagnosis not present

## 2017-09-14 DIAGNOSIS — I2510 Atherosclerotic heart disease of native coronary artery without angina pectoris: Secondary | ICD-10-CM | POA: Diagnosis not present

## 2017-09-14 DIAGNOSIS — Z9181 History of falling: Secondary | ICD-10-CM | POA: Diagnosis not present

## 2017-09-14 DIAGNOSIS — E785 Hyperlipidemia, unspecified: Secondary | ICD-10-CM | POA: Diagnosis not present

## 2017-09-14 DIAGNOSIS — S82891D Other fracture of right lower leg, subsequent encounter for closed fracture with routine healing: Secondary | ICD-10-CM | POA: Diagnosis not present

## 2017-09-14 DIAGNOSIS — S8261XD Displaced fracture of lateral malleolus of right fibula, subsequent encounter for closed fracture with routine healing: Secondary | ICD-10-CM | POA: Diagnosis not present

## 2017-09-15 DIAGNOSIS — I10 Essential (primary) hypertension: Secondary | ICD-10-CM | POA: Diagnosis not present

## 2017-09-15 DIAGNOSIS — E7800 Pure hypercholesterolemia, unspecified: Secondary | ICD-10-CM | POA: Diagnosis not present

## 2017-09-15 DIAGNOSIS — E119 Type 2 diabetes mellitus without complications: Secondary | ICD-10-CM | POA: Diagnosis not present

## 2017-09-16 ENCOUNTER — Encounter (HOSPITAL_COMMUNITY): Payer: Self-pay | Admitting: Emergency Medicine

## 2017-09-16 ENCOUNTER — Inpatient Hospital Stay (HOSPITAL_COMMUNITY)
Admission: EM | Admit: 2017-09-16 | Discharge: 2017-09-18 | DRG: 683 | Disposition: A | Discharge status: Home Health | Payer: Medicare Other | Attending: Internal Medicine | Admitting: Internal Medicine

## 2017-09-16 DIAGNOSIS — I959 Hypotension, unspecified: Secondary | ICD-10-CM | POA: Diagnosis present

## 2017-09-16 DIAGNOSIS — R7889 Finding of other specified substances, not normally found in blood: Secondary | ICD-10-CM | POA: Diagnosis not present

## 2017-09-16 DIAGNOSIS — Z888 Allergy status to other drugs, medicaments and biological substances status: Secondary | ICD-10-CM

## 2017-09-16 DIAGNOSIS — N289 Disorder of kidney and ureter, unspecified: Secondary | ICD-10-CM | POA: Diagnosis not present

## 2017-09-16 DIAGNOSIS — Z79899 Other long term (current) drug therapy: Secondary | ICD-10-CM

## 2017-09-16 DIAGNOSIS — Z7982 Long term (current) use of aspirin: Secondary | ICD-10-CM | POA: Diagnosis not present

## 2017-09-16 DIAGNOSIS — Z833 Family history of diabetes mellitus: Secondary | ICD-10-CM | POA: Diagnosis not present

## 2017-09-16 DIAGNOSIS — Z8249 Family history of ischemic heart disease and other diseases of the circulatory system: Secondary | ICD-10-CM

## 2017-09-16 DIAGNOSIS — E876 Hypokalemia: Secondary | ICD-10-CM | POA: Diagnosis present

## 2017-09-16 DIAGNOSIS — E119 Type 2 diabetes mellitus without complications: Secondary | ICD-10-CM | POA: Diagnosis not present

## 2017-09-16 DIAGNOSIS — R001 Bradycardia, unspecified: Secondary | ICD-10-CM | POA: Diagnosis present

## 2017-09-16 DIAGNOSIS — N390 Urinary tract infection, site not specified: Secondary | ICD-10-CM | POA: Diagnosis present

## 2017-09-16 DIAGNOSIS — I10 Essential (primary) hypertension: Secondary | ICD-10-CM | POA: Diagnosis present

## 2017-09-16 DIAGNOSIS — N179 Acute kidney failure, unspecified: Secondary | ICD-10-CM | POA: Diagnosis not present

## 2017-09-16 DIAGNOSIS — N19 Unspecified kidney failure: Secondary | ICD-10-CM | POA: Diagnosis not present

## 2017-09-16 LAB — URINALYSIS, ROUTINE W REFLEX MICROSCOPIC
BILIRUBIN URINE: NEGATIVE
Glucose, UA: 50 mg/dL — AB
HGB URINE DIPSTICK: NEGATIVE
KETONES UR: NEGATIVE mg/dL
Nitrite: POSITIVE — AB
PH: 5 (ref 5.0–8.0)
Protein, ur: NEGATIVE mg/dL
Specific Gravity, Urine: 1.011 (ref 1.005–1.030)

## 2017-09-16 LAB — COMPREHENSIVE METABOLIC PANEL
ALT: 19 U/L (ref 14–54)
ANION GAP: 17 — AB (ref 5–15)
AST: 26 U/L (ref 15–41)
Albumin: 4.6 g/dL (ref 3.5–5.0)
Alkaline Phosphatase: 70 U/L (ref 38–126)
BUN: 141 mg/dL — ABNORMAL HIGH (ref 6–20)
CHLORIDE: 94 mmol/L — AB (ref 101–111)
CO2: 26 mmol/L (ref 22–32)
Calcium: 9.8 mg/dL (ref 8.9–10.3)
Creatinine, Ser: 2.34 mg/dL — ABNORMAL HIGH (ref 0.44–1.00)
GFR calc non Af Amer: 20 mL/min — ABNORMAL LOW (ref >60)
GFR, EST AFRICAN AMERICAN: 23 mL/min — AB (ref >60)
Glucose, Bld: 165 mg/dL — ABNORMAL HIGH (ref 65–99)
POTASSIUM: 3.1 mmol/L — AB (ref 3.5–5.1)
SODIUM: 137 mmol/L (ref 135–145)
Total Bilirubin: 1.1 mg/dL (ref 0.3–1.2)
Total Protein: 9 g/dL — ABNORMAL HIGH (ref 6.5–8.1)

## 2017-09-16 LAB — CBC
HCT: 47.1 % — ABNORMAL HIGH (ref 36.0–46.0)
HEMOGLOBIN: 17 g/dL — AB (ref 12.0–15.0)
MCH: 29.4 pg (ref 26.0–34.0)
MCHC: 36.1 g/dL — ABNORMAL HIGH (ref 30.0–36.0)
MCV: 81.3 fL (ref 78.0–100.0)
Platelets: 169 10*3/uL (ref 150–400)
RBC: 5.79 MIL/uL — AB (ref 3.87–5.11)
RDW: 13.9 % (ref 11.5–15.5)
WBC: 12.1 10*3/uL — ABNORMAL HIGH (ref 4.0–10.5)

## 2017-09-16 LAB — MAGNESIUM: MAGNESIUM: 3.2 mg/dL — AB (ref 1.7–2.4)

## 2017-09-16 LAB — CK: Total CK: 140 U/L (ref 38–234)

## 2017-09-16 MED ORDER — KETOCONAZOLE 2 % EX CREA
TOPICAL_CREAM | Freq: Every day | CUTANEOUS | Status: DC
  Administered 2017-09-17: 09:00:00 via TOPICAL
  Filled 2017-09-16: qty 15

## 2017-09-16 MED ORDER — POTASSIUM CHLORIDE IN NACL 20-0.9 MEQ/L-% IV SOLN
INTRAVENOUS | Status: AC
  Administered 2017-09-16: 22:00:00 via INTRAVENOUS
  Filled 2017-09-16: qty 1000

## 2017-09-16 MED ORDER — SODIUM CHLORIDE 0.9 % IV BOLUS (SEPSIS): 500.0000 mL | Freq: Once | INTRAVENOUS | Status: DC

## 2017-09-16 MED ORDER — VITAMIN D 1000 UNITS PO TABS
2000.0000 [IU] | ORAL_TABLET | Freq: Every day | ORAL | Status: DC
  Administered 2017-09-17: 2000 [IU] via ORAL
  Filled 2017-09-16: qty 2

## 2017-09-16 MED ORDER — POTASSIUM CHLORIDE IN NACL 20-0.45 MEQ/L-% IV SOLN
INTRAVENOUS | Status: DC
  Administered 2017-09-17: 01:00:00 via INTRAVENOUS
  Filled 2017-09-16 (×2): qty 1000

## 2017-09-16 MED ORDER — INSULIN ASPART 100 UNIT/ML ~~LOC~~ SOLN
0.0000 [IU] | Freq: Three times a day (TID) | SUBCUTANEOUS | Status: DC
  Administered 2017-09-17: 2 [IU] via SUBCUTANEOUS
  Administered 2017-09-17: 3 [IU] via SUBCUTANEOUS
  Administered 2017-09-17 – 2017-09-18 (×2): 5 [IU] via SUBCUTANEOUS
  Administered 2017-09-18: 2 [IU] via SUBCUTANEOUS

## 2017-09-16 MED ORDER — ENOXAPARIN SODIUM 30 MG/0.3ML ~~LOC~~ SOLN
30.0000 mg | Freq: Every day | SUBCUTANEOUS | Status: DC
  Administered 2017-09-17: 30 mg via SUBCUTANEOUS
  Filled 2017-09-16: qty 0.3

## 2017-09-16 NOTE — H&P (Signed)
History and Physical    Loretta [name]:[id number] DOB: September 11, 1946 DOA: 09/16/2017  PCP: Darci Needle, MD   Patient coming from: Doctor's office.  I have personally briefly reviewed patient's old medical records in [facility]  Chief Complaint: Abnormal labs.  HPI: Loretta [name] is a 71 y.o. female with medical history significant of type 2 diabetes, hyperlipidemia, hypertension who was sent by her PCP to the emergency department due to abnormal lab results and hypotension. She takes Benicar HCT for hypertension and has been taking 80 mg of Lasix once a day, instead of 40 mg a day. However, per EDP notes the patient has been taking 80 mg po BID. she complains of postural lightheadedness and decreased urinary volume, but denies headache, chest pain, dyspnea, palpitations, diaphoresis, pitting edema lower extremities, abdominal pain, nausea, emesis, diarrhea, constipation, melena or hematochezia. She denies dysuria, frequency or hematuria.  ED Course: Initial vital signs temperature 97.41F, pulse 54, respirations 20, blood pressure 95/49 mmHg and O2 sat 98% on room air. The patient was given IV fluids with potassium supplementation and states she feels better. She has been able to urinate in the emergency Department.  Her workup shows a urinalysis with large leukocyte esterase, WBCs 6-30 and many bacteria on microscopic. Hemoglobin was 17 g/dL, WBC 95.6 platelets 213. Her CMP shows a potassium of 3.1 mmol/L, BUN 141, creatinine 2.34 (creatinine in January ranged from 0.67 to 0.77) and glucose 165 mg/dL. Her total protein was 9.0 g/dL.   Review of Systems: As per HPI otherwise 10 point review of systems negative.    Past Medical History:  Diagnosis Date  . Diabetes mellitus without complication (HCC)    Type  . Hyperlipidemia   . Hypertension     Past Surgical History:  Procedure Laterality Date  . ORIF ANKLE FRACTURE Right 01/04/2017   Procedure: OPEN REDUCTION INTERNAL  FIXATION (ORIF) RIGHT ANKLE FRACTURE;  Surgeon: Eldred Manges, MD;  Location: [facility] OR;  Service: Orthopedics;  Laterality: Right;     reports that she has never smoked. She has never used smokeless tobacco. She reports that she does not drink alcohol or use drugs.  Allergies  Allergen Reactions  . Pravastatin Other (See Comments)    Caused leg paralysis     Family History  Problem Relation Age of Onset  . Congestive Heart Failure Mother   . Diabetes Mellitus II Father   . Osteoporosis Neg Hx     Prior to Admission medications   Medication Sig Start Date End Date Taking? Authorizing Provider  amLODipine (NORVASC) 5 MG tablet Take 5 mg by mouth daily.  09/27/16  Yes [provider]  aspirin EC 325 MG tablet Take 1 tablet (325 mg total) by mouth daily. 01/05/17  Yes Naida Sleight, PA-C  atorvastatin (LIPITOR) 20 MG tablet Take 20 mg by mouth at bedtime.  10/19/16  Yes [provider]  cloNIDine (CATAPRES) 0.1 MG tablet Take 0.1 mg by mouth at bedtime.  04/05/17  Yes [provider]  Dapagliflozin-Metformin HCl ER (XIGDUO XR) 09-999 MG TB24 Take 1 tablet by mouth daily.   Yes [provider]  furosemide (LASIX) 40 MG tablet Take 80 mg by mouth daily.  11/23/16  Yes [provider]  ketoconazole (NIZORAL) 2 % cream APPLY TO AFFECTED AREA ONCE DAILY EXTERNALLY FOR 14 DAYS 09/06/17  Yes [provider]  KLOR-CON M20 20 MEQ tablet Take 20 mEq by mouth daily. 09/08/17  Yes [provider]  Multiple Vitamin (MULTIVITAMIN WITH MINERALS) TABS tablet Take 1 tablet by mouth daily.   Yes [provider]  nebivolol (BYSTOLIC) 10 MG tablet Take 10 mg by mouth daily.   Yes [provider]  olmesartan-hydrochlorothiazide (BENICAR HCT) 40-25 MG tablet Take 1 tablet by mouth daily. 11/10/16  Yes [provider]  Vitamin D, Ergocalciferol, 2000 units CAPS Take 1 capsule by mouth daily.   Yes [provider]    oxyCODONE-acetaminophen (PERCOCET/ROXICET) 5-325 MG tablet Take 1 tablet by mouth every 6 (six) hours as needed for severe pain. Patient not taking: Reported on 04/20/2017 01/05/17   Naida Sleight, PA-C    Physical Exam: Vitals:   09/16/17 2030 09/16/17 2100 09/16/17 2130 09/16/17 2200  BP: (!) 95/58 105/61 103/60 120/72  Pulse: 91 (!) 49 (!) 48 (!) 49  Resp: Temp:      TempSrc:      SpO2: 93% 97% 93% 98%    Constitutional: NAD, calm, comfortable Eyes: PERRL, lids and conjunctivae normal ENMT: Mucous membranes are mildly dry. Posterior pharynx clear of any exudate or lesions. Neck: normal, supple, no masses, no thyromegaly Respiratory: clear to auscultation bilaterally, no wheezing, no crackles. Normal respiratory effort. No accessory muscle use.  Cardiovascular: Regular rate and rhythm, no murmurs / rubs / gallops. No extremity edema. 2+ pedal pulses. No carotid bruits.  Abdomen: Obese. Soft, no tenderness, no masses palpated. No hepatosplenomegaly. Bowel sounds positive.  Musculoskeletal: no clubbing / cyanosis. Good ROM, no contractures. Normal muscle tone.  Skin: Decreased turgor, but otherwise no significant rashes, lesions, ulcers on limited skin exam. Neurologic: CN 2-12 grossly intact. Sensation intact, DTR normal. Strength 5/5 in all 4.  Psychiatric: Normal judgment and insight. Alert and oriented x 4. Normal mood.     Labs on Admission: I have personally reviewed following labs and imaging studies  CBC:  Recent Labs Lab 09/16/17 1907  WBC 12.1*  HGB 17.0*  HCT 47.1*  MCV 81.3  PLT 169   Basic Metabolic Panel:  Recent Labs Lab 09/16/17 1907  NA 137  K 3.1*  CL 94*  CO2 26  GLUCOSE 165*  BUN 141*  CREATININE 2.34*  CALCIUM 9.8   GFR: CrCl cannot be calculated (Unknown ideal weight.). Liver Function Tests:  Recent Labs Lab 09/16/17 1907  AST 26  ALT 19  ALKPHOS 70  BILITOT 1.1  PROT 9.0*  ALBUMIN 4.6   No results for input(s):  LIPASE, AMYLASE in the last 168 hours. No results for input(s): AMMONIA in the last 168 hours. Coagulation Profile: No results for input(s): INR, PROTIME in the last 168 hours. Cardiac Enzymes:  Recent Labs Lab 09/16/17 1907  CKTOTAL 140   BNP (last 3 results) No results for input(s): PROBNP in the last 8760 hours. HbA1C: No results for input(s): HGBA1C in the last 72 hours. CBG: No results for input(s): GLUCAP in the last 168 hours. Lipid Profile: No results for input(s): CHOL, HDL, LDLCALC, TRIG, CHOLHDL, LDLDIRECT in the last 72 hours. Thyroid Function Tests: No results for input(s): TSH, T4TOTAL, FREET4, T3FREE, THYROIDAB in the last 72 hours. Anemia Panel: No results for input(s): VITAMINB12, FOLATE, FERRITIN, TIBC, IRON, RETICCTPCT in the last 72 hours. Urine analysis: No results found for: COLORURINE, APPEARANCEUR, LABSPEC, PHURINE, GLUCOSEU, HGBUR, BILIRUBINUR, KETONESUR, PROTEINUR, UROBILINOGEN, NITRITE, LEUKOCYTESUR  Radiological Exams on Admission: No results found.  EKG: Independently reviewed.   Assessment/Plan Principal Problem:   AKI (acute kidney injury) (HCC) Secondary to higher than  usual furosemide dose. She is also on Benicar HCT 40/25 mg by mouth daily. Observation/telemetry. Continue IV fluids. Monitor renal function and electrolytes. Renal ultrasound in a.m.  Active Problems:   DM2 (diabetes mellitus, type 2) (HCC) Carbohydrate modified diet. Hold metformin for now. CBG monitoring with regular insulin sliding scale while in the hospital.    HTN (hypertension) Hold antihypertensives for now. Monitor blood pressure.    Hypokalemia Replacing. Follow-up potassium level in a.m.    Bradycardia Hold beta blocker. Telemetry monitoring.    DVT prophylaxis: Lovenox SQ. Code Status: Full code. Family Communication: None by bedside. Disposition Plan: Observation overnight for Iv hydration. Consults called:  Admission status:  Observation/Telemetry.   Bobette Mo MD [facility] Pager [phone]  If 7PM-7AM, please contact night-coverage [email or web address] Password [facility]  09/16/2017, 10:36 PM

## 2017-09-16 NOTE — Progress Notes (Signed)
Called ED to get report on patient. RN tied up at present, but Diplomatic Services operational officer given direct number for her to call back to 4E to give report when ready.

## 2017-09-16 NOTE — ED Notes (Signed)
Bed: WA22 Expected date:  Expected time:  Means of arrival:  Comments: Triage 

## 2017-09-16 NOTE — ED Triage Notes (Addendum)
Patient here from [facility] with complaints of abnormal labs. Increased BUN and Creat. No complaints. Hx of acute renal failure possibly due to excessive diuresis, Benicar, and possibly ibuprofen use per MD. Patient reports that she takes 80 of Lasix.

## 2017-09-16 NOTE — ED Notes (Signed)
Pt plans to called out when she thinks she is ready to give UA

## 2017-09-17 ENCOUNTER — Observation Stay (HOSPITAL_COMMUNITY): Payer: Medicare Other

## 2017-09-17 DIAGNOSIS — E876 Hypokalemia: Secondary | ICD-10-CM | POA: Diagnosis not present

## 2017-09-17 DIAGNOSIS — R001 Bradycardia, unspecified: Secondary | ICD-10-CM | POA: Diagnosis not present

## 2017-09-17 DIAGNOSIS — N179 Acute kidney failure, unspecified: Principal | ICD-10-CM

## 2017-09-17 LAB — GLUCOSE, CAPILLARY
GLUCOSE-CAPILLARY: 139 mg/dL — AB (ref 65–99)
GLUCOSE-CAPILLARY: 175 mg/dL — AB (ref 65–99)
GLUCOSE-CAPILLARY: 175 mg/dL — AB (ref 65–99)
GLUCOSE-CAPILLARY: 224 mg/dL — AB (ref 65–99)
Glucose-Capillary: 152 mg/dL — ABNORMAL HIGH (ref 65–99)

## 2017-09-17 LAB — BASIC METABOLIC PANEL
Anion gap: 14 (ref 5–15)
BUN: 136 mg/dL — ABNORMAL HIGH (ref 6–20)
CALCIUM: 9.2 mg/dL (ref 8.9–10.3)
CHLORIDE: 99 mmol/L — AB (ref 101–111)
CO2: 26 mmol/L (ref 22–32)
Creatinine, Ser: 1.92 mg/dL — ABNORMAL HIGH (ref 0.44–1.00)
GFR calc Af Amer: 30 mL/min — ABNORMAL LOW (ref >60)
GFR, EST NON AFRICAN AMERICAN: 26 mL/min — AB (ref >60)
Glucose, Bld: 166 mg/dL — ABNORMAL HIGH (ref 65–99)
POTASSIUM: 2.9 mmol/L — AB (ref 3.5–5.1)
SODIUM: 139 mmol/L (ref 135–145)

## 2017-09-17 LAB — CBC
HCT: 42.3 % (ref 36.0–46.0)
HEMOGLOBIN: 15.2 g/dL — AB (ref 12.0–15.0)
MCH: 29.3 pg (ref 26.0–34.0)
MCHC: 35.9 g/dL (ref 30.0–36.0)
MCV: 81.5 fL (ref 78.0–100.0)
Platelets: 157 10*3/uL (ref 150–400)
RBC: 5.19 MIL/uL — ABNORMAL HIGH (ref 3.87–5.11)
RDW: 14 % (ref 11.5–15.5)
WBC: 9.8 10*3/uL (ref 4.0–10.5)

## 2017-09-17 MED ORDER — LORAZEPAM 2 MG/ML IJ SOLN
INTRAMUSCULAR | Status: AC
  Administered 2017-09-17: 1 mg via INTRAVENOUS
  Filled 2017-09-17: qty 1

## 2017-09-17 MED ORDER — SODIUM CHLORIDE 0.9 % IV SOLN
INTRAVENOUS | Status: DC
  Administered 2017-09-17: 09:00:00 via INTRAVENOUS

## 2017-09-17 MED ORDER — ENOXAPARIN SODIUM 40 MG/0.4ML ~~LOC~~ SOLN
40.0000 mg | Freq: Every day | SUBCUTANEOUS | Status: DC
  Administered 2017-09-18: 40 mg via SUBCUTANEOUS
  Filled 2017-09-17: qty 0.4

## 2017-09-17 MED ORDER — LORAZEPAM 2 MG/ML IJ SOLN
1.0000 mg | INTRAMUSCULAR | Status: AC | PRN
  Administered 2017-09-17 (×2): 1 mg via INTRAVENOUS
  Filled 2017-09-17: qty 1

## 2017-09-17 MED ORDER — SULFAMETHOXAZOLE-TRIMETHOPRIM 400-80 MG PO TABS
1.0000 | ORAL_TABLET | Freq: Two times a day (BID) | ORAL | Status: DC
  Administered 2017-09-17 (×2): 1 via ORAL
  Filled 2017-09-17 (×2): qty 1

## 2017-09-17 MED ORDER — POTASSIUM CHLORIDE CRYS ER 20 MEQ PO TBCR
40.0000 meq | EXTENDED_RELEASE_TABLET | Freq: Two times a day (BID) | ORAL | Status: AC
  Administered 2017-09-17 – 2017-09-18 (×2): 40 meq via ORAL
  Filled 2017-09-17 (×2): qty 2

## 2017-09-17 MED ORDER — DEXTROSE 5 % IV SOLN
1.0000 g | INTRAVENOUS | Status: DC
  Administered 2017-09-17: 1 g via INTRAVENOUS
  Filled 2017-09-17 (×2): qty 10

## 2017-09-17 NOTE — Evaluation (Signed)
Physical Therapy Evaluation Patient Details Name: Loretta [name] MRN: [id number] DOB: [birth year] Today's Date: 09/17/2017   History of Present Illness  71 y.o. female with PMH of ORIF R ankle 01/04/17, DM, HLD, and HTN admitted presented to ED from doctor's office with elevated creatinine. Dx of AKI.   Clinical Impression  Pt is at baseline with mobility, she ambulated 61' with RW without loss of balance and is independent with bed mobility and transfers. From PT standpoint, she is ready to DC home, no further PT indicated. Pt signing off.     Follow Up Recommendations No PT follow up    Equipment Recommendations  None recommended by PT    Recommendations for Other Services       Precautions / Restrictions Precautions Precautions: Fall Precaution Comments: pt denies falls in past couple months Restrictions Weight Bearing Restrictions: No      Mobility  Bed Mobility Overal bed mobility: Independent                Transfers Overall transfer level: Modified independent Equipment used: Rolling walker (2 wheeled)                Ambulation/Gait Ambulation/Gait assistance: Modified independent (Device/Increase time) Ambulation Distance (Feet): 70 Feet Assistive device: Rolling walker (2 wheeled) Gait Pattern/deviations: WFL(Within Functional Limits) Gait velocity: mildly decreased   General Gait Details: steady with RW, no LOB  Stairs            Wheelchair Mobility    Modified Rankin (Stroke Patients Only)       Balance Overall balance assessment: Modified Independent                                           Pertinent Vitals/Pain Pain Assessment: No/denies pain    Home Living Family/patient expects to be discharged to:: Private residence Living Arrangements: Alone Available Help at Discharge: Family;Available PRN/intermittently Type of Home: House Home Access: Stairs to enter Entrance Stairs-Rails: Left Entrance  Stairs-Number of Steps: 1 + 2    Home Equipment: Cane - quad;Walker - 2 wheels Additional Comments: 3 steps to enter 2 level home with bedroom on 2nd floor    Prior Function Level of Independence: Independent with assistive device(s)         Comments: uses quad cane for stairs     Hand Dominance        Extremity/Trunk Assessment   Upper Extremity Assessment Upper Extremity Assessment: Overall WFL for tasks assessed    Lower Extremity Assessment Lower Extremity Assessment: Overall WFL for tasks assessed    Cervical / Trunk Assessment Cervical / Trunk Assessment: Normal  Communication      Cognition Arousal/Alertness: Awake/alert Behavior During Therapy: WFL for tasks assessed/performed Overall Cognitive Status: Within Functional Limits for tasks assessed                                        General Comments      Exercises     Assessment/Plan    PT Assessment Patent does not need any further PT services  PT Problem List         PT Treatment Interventions      PT Goals (Current goals can be found in the Care Plan section)  Acute Rehab PT Goals PT Goal  Formulation: All assessment and education complete, DC therapy    Frequency     Barriers to discharge        Co-evaluation               AM-PAC PT "6 Clicks" Daily Activity  Outcome Measure Difficulty turning over in bed (including adjusting bedclothes, sheets and blankets)?: None Difficulty moving from lying on back to sitting on the side of the bed? : None Difficulty sitting down on and standing up from a chair with arms (e.g., wheelchair, bedside commode, etc,.)?: None Help needed moving to and from a bed to chair (including a wheelchair)?: None Help needed walking in hospital room?: None Help needed climbing 3-5 steps with a railing? : None 6 Click Score: 24    End of Session Equipment Utilized During Treatment: Gait belt Activity Tolerance: Patient tolerated treatment  well Patient left: in chair;with call bell/phone within reach;with chair alarm set Nurse Communication: Mobility status      Time: 9147-8295 PT Time Calculation (min) (ACUTE ONLY): 20 min   Charges:   PT Evaluation $PT Eval Low Complexity: 1 Low     PT G CodesTamala Ser 09/17/2017, 2:54 PM [phone]

## 2017-09-17 NOTE — Care Management Note (Signed)
Case Management Note  Patient Details  Name: FREDDIE NGHIEM MRN: [id number] Date of Birth: 1946-04-15  Subjective/Objective:70 y/o f admitted w/Acute Kidney Injury. From home. Patient says she uses [facility] for [employer] w/[facility] rep Mary-await response. Would recc HHRN-med mgmnt.                    Action/Plan:d/c home w/HHC.   Expected Discharge Date:                  Expected Discharge Plan:  Home w Home Health Services  In-House Referral:     Discharge planning Services  CM Consult  Post Acute Care Choice:    Choice offered to:     DME Arranged:    DME Agency:     HH Arranged:    HH Agency:     Status of Service:  In process, will continue to follow  If discussed at Long Length of Stay Meetings, dates discussed:    Additional Comments:  Lanier Clam, RN 09/17/2017, 3:37 PM

## 2017-09-17 NOTE — ED Provider Notes (Signed)
[facility]-EMERGENCY DEPT Provider Note   CSN: [id number] Arrival date & time: 09/16/17  1515     History   Chief Complaint Chief Complaint  Patient presents with  . Abnormal Lab    HPI Loretta [name] is a 71 y.o. female.  HPI Patient presents to the emergency department with elevated creatinine that was noted in her doctor's office.  The patient's been taking 80 mg of Lasix twice a day and she feels that that is a dose that is too strong.  Her doctor sent her here for elevated BUN/creatinine patient states she does not have any other complaintsThe patient denies chest pain, shortness of breath, headache,blurred vision, neck pain, fever, cough, weakness, numbness, dizziness, anorexia, edema, abdominal pain, nausea, vomiting, diarrhea, rash, back pain, dysuria, hematemesis, bloody stool, near syncope, or syncope. Past Medical History:  Diagnosis Date  . Diabetes mellitus without complication (HCC)    Type  . Hyperlipidemia   . Hypertension     Patient Active Problem List   Diagnosis Date Noted  . AKI (acute kidney injury) (HCC) 09/16/2017  . Closed right ankle fracture 01/04/2017  . Displaced fracture of lateral malleolus of right fibula, subsequent encounter for closed fracture with routine healing   . Bimalleolar ankle fracture 12/15/2016  . DM2 (diabetes mellitus, type 2) (HCC) 12/15/2016  . HTN (hypertension) 12/15/2016  . Hypokalemia 12/15/2016  . Hypernatremia 12/15/2016    Past Surgical History:  Procedure Laterality Date  . ORIF ANKLE FRACTURE Right 01/04/2017   Procedure: OPEN REDUCTION INTERNAL FIXATION (ORIF) RIGHT ANKLE FRACTURE;  Surgeon: Eldred Manges, MD;  Location: [facility] OR;  Service: Orthopedics;  Laterality: Right;    OB History    No data available       Home Medications    Prior to Admission medications   Medication Sig Start Date End Date Taking? Authorizing Provider  amLODipine (NORVASC) 5 MG tablet Take 5 mg by mouth daily.  09/27/16  Yes [provider]  aspirin EC 325 MG tablet Take 1 tablet (325 mg total) by mouth daily. 01/05/17  Yes Naida Sleight, PA-C  atorvastatin (LIPITOR) 20 MG tablet Take 20 mg by mouth at bedtime.  10/19/16  Yes [provider]  cloNIDine (CATAPRES) 0.1 MG tablet Take 0.1 mg by mouth at bedtime.  04/05/17  Yes [provider]  Dapagliflozin-Metformin HCl ER (XIGDUO XR) 09-999 MG TB24 Take 1 tablet by mouth daily.   Yes [provider]  furosemide (LASIX) 40 MG tablet Take 80 mg by mouth daily.  11/23/16  Yes [provider]  ketoconazole (NIZORAL) 2 % cream APPLY TO AFFECTED AREA ONCE DAILY EXTERNALLY FOR 14 DAYS 09/06/17  Yes [provider]  KLOR-CON M20 20 MEQ tablet Take 20 mEq by mouth daily. 09/08/17  Yes [provider]  Multiple Vitamin (MULTIVITAMIN WITH MINERALS) TABS tablet Take 1 tablet by mouth daily.   Yes [provider]  nebivolol (BYSTOLIC) 10 MG tablet Take 10 mg by mouth daily.   Yes [provider]  olmesartan-hydrochlorothiazide (BENICAR HCT) 40-25 MG tablet Take 1 tablet by mouth daily. 11/10/16  Yes [provider]  Vitamin D, Ergocalciferol, 2000 units CAPS Take 1 capsule by mouth daily.   Yes [provider]  oxyCODONE-acetaminophen (PERCOCET/ROXICET) 5-325 MG tablet Take 1 tablet by mouth every 6 (six) hours as needed for severe pain. Patient not taking: Reported on 04/20/2017 01/05/17   Naida Sleight, PA-C    Family History Family History  Problem  Relation Age of Onset  . Congestive Heart Failure Mother   . Diabetes Mellitus II Father   . Osteoporosis Neg Hx     Social History Social History  Substance Use Topics  . Smoking status: Never Smoker  . Smokeless tobacco: Never Used  . Alcohol use No     Allergies   Pravastatin   Review of Systems Review of Systems  All other systems negative except as documented in the HPI. All pertinent positives and negatives as reviewed in the  HPI. Physical Exam Updated Vital Signs BP (!) 106/54 (BP Location: Right Arm)   Pulse (!) 59   Temp 98.3 F (36.8 C) (Oral)   Resp 20   Ht  (1.727 m)   Wt 112.7 kg (248 lb 7.3 oz)   SpO2 100%   BMI 37.78 kg/m   Physical Exam  Constitutional: She is oriented to person, place, and time. She appears well-developed and well-nourished. No distress.  HENT:  Head: Normocephalic and atraumatic.  Mouth/Throat: Oropharynx is clear and moist.  Eyes: Pupils are equal, round, and reactive to light.  Neck: Normal range of motion. Neck supple.  Cardiovascular: Normal rate, regular rhythm and normal heart sounds.  Exam reveals no gallop and no friction rub.   No murmur heard. Pulmonary/Chest: Effort normal and breath sounds normal. No respiratory distress. She has no wheezes.  Abdominal: Soft. Bowel sounds are normal. She exhibits no distension. There is no tenderness.  Neurological: She is alert and oriented to person, place, and time. She exhibits normal muscle tone. Coordination normal.  Skin: Skin is warm and dry. Capillary refill takes less than 2 seconds. No rash noted. No erythema.  Psychiatric: She has a normal mood and affect. Her behavior is normal.  Nursing note and vitals reviewed.    ED Treatments / Results  Labs (all labs ordered are listed, but only abnormal results are displayed) Labs Reviewed  COMPREHENSIVE METABOLIC PANEL - Abnormal; Notable for the following:       Result Value   Potassium 3.1 (*)    Chloride 94 (*)    Glucose, Bld 165 (*)    BUN 141 (*)    Creatinine, Ser 2.34 (*)    Total Protein 9.0 (*)    GFR calc non Af Amer 20 (*)    GFR calc Af Amer 23 (*)    Anion gap 17 (*)    All other components within normal limits  CBC - Abnormal; Notable for the following:    WBC 12.1 (*)    RBC 5.79 (*)    Hemoglobin 17.0 (*)    HCT 47.1 (*)    MCHC 36.1 (*)    All other components within normal limits  URINALYSIS, ROUTINE W REFLEX MICROSCOPIC - Abnormal;  Notable for the following:    APPearance HAZY (*)    Glucose, UA 50 (*)    Nitrite POSITIVE (*)    Leukocytes, UA LARGE (*)    Bacteria, UA MANY (*)    Squamous Epithelial / LPF 0-5 (*)    All other components within normal limits  MAGNESIUM - Abnormal; Notable for the following:    Magnesium 3.2 (*)    All other components within normal limits  GLUCOSE, CAPILLARY - Abnormal; Notable for the following:    Glucose-Capillary 175 (*)    All other components within normal limits  CK  CBC  BASIC METABOLIC PANEL    EKG  EKG Interpretation None  Radiology No results found.  Procedures Procedures (including critical care time)  Medications Ordered in ED Medications  0.9 % NaCl with KCl 20 mEq/ L  infusion ( Intravenous Stopped 09/17/17 0048)  cholecalciferol (VITAMIN D) tablet 2,000 Units (not administered)  ketoconazole (NIZORAL) 2 % cream (not administered)  enoxaparin (LOVENOX) injection 30 mg (30 mg Subcutaneous Given 09/17/17 0048)  0.45 % NaCl with KCl 20 mEq / L infusion ( Intravenous New Bag/Given 09/17/17 0048)  insulin aspart (novoLOG) injection 0-15 Units (not administered)  LORazepam (ATIVAN) injection 1 mg (1 mg Intravenous Given 09/17/17 0009)     Initial Impression / Assessment and Plan / ED Course  I have reviewed the triage vital signs and the nursing notes.  Pertinent labs & imaging results that were available during my care of the patient were reviewed by me and considered in my medical decision making (see chart for details).     The patient be admitted for any acute kidney injury.  She is advised plan and all questions were answered.  Patient agrees.  I spoke with the [facility] hospitalist, who will admit the patient.  Patient is given some IV fluids here in the emergency department  Final Clinical Impressions(s) / ED Diagnoses   Final diagnoses:  AKI (acute kidney injury) [facility])    New Prescriptions Current Discharge Medication List        Charlestine Night, PA-C 09/17/17 0144    Donnetta Hutching, MD 09/21/17 [phone]

## 2017-09-17 NOTE — Care Management Obs Status (Signed)
MEDICARE OBSERVATION STATUS NOTIFICATION   Patient Details  Name: Loretta [name] MRN: [id number] Date of Birth: [birth year]   Medicare Observation Status Notification Given:  Yes    Lanier Clam, RN 09/17/2017, 3:36 PM

## 2017-09-17 NOTE — Care Management Note (Signed)
Case Management Note  Patient Details  Name: Loretta [name] MRN: [id number] Date of Birth: [birth year]  Subjective/Objective: Confirmed patient is active w/[facility]-HHRN/HHPT-please resume @ d/c-rep [facility] aware.                   Action/Plan:d/c home w/HHC.   Expected Discharge Date:                  Expected Discharge Plan:  Home w Home Health Services  In-House Referral:     Discharge planning Services  CM Consult  Post Acute Care Choice:  Home Health (Active w/[facility]-HHRN/HHPT) Choice offered to:     DME Arranged:    DME Agency:     HH Arranged:  RN, PT HH Agency:  [facility] (formerly [facility])  Status of Service:  In process, will continue to follow  If discussed at Long Length of Stay Meetings, dates discussed:    Additional Comments:  Lanier Clam, RN 09/17/2017, 3:58 PM

## 2017-09-17 NOTE — Progress Notes (Addendum)
PROGRESS NOTE    Loretta [name]  ZOX:[id number] DOB: Dec 26, 1945 DOA: 09/16/2017 PCP: Darci Needle, MD    Brief Narrative:  71 y.o. female with medical history significant of type 2 diabetes, hyperlipidemia, hypertension who was sent by her PCP to the emergency department due to abnormal lab results and hypotension. She takes Benicar HCT for hypertension and has been taking 80 mg of Lasix once a day, instead of 40 mg a day. However, per EDP notes the patient has been taking 80 mg po BID. she complains of postural lightheadedness and decreased urinary volume, but denies headache, chest pain, dyspnea, palpitations, diaphoresis, pitting edema lower extremities, abdominal pain, nausea, emesis, diarrhea, constipation, melena or hematochezia. She denies dysuria, frequency or hematuria.patient was found to have acute renal failure with UA suggestive of urinary tract infection.  Assessment & Plan:   Principal Problem:   AKI (acute kidney injury) (HCC) Active Problems:   DM2 (diabetes mellitus, type 2) (HCC)   HTN (hypertension)   Hypokalemia   Bradycardia  Principal Problem:   AKI (acute kidney injury) (HCC) -likely secondary to higher than usual furosemide dose. -patient had been on Benicar HCT 40/25 mg by mouth dailyprior to admission -continue IV fluid hydration as tolerated -Renal function slightly improved this morning. Will repeat basic metabolic panel morning  Active Problems:   DM2 (diabetes mellitus, type 2) (HCC) -Carbohydrate modified diet. -Hold metformin secondary to presenting acute renal failure -continue sliding scale insulin    HTN (hypertension) -Hold antihypertensives for now. -stable at present    Hypokalemia -Will replace -repeat basic metabolic panel in the morning    Bradycardia -held beta blocker time of admission -patient remained stable  UTI -presenting urinalysis suggestive of UTI -We'll continue patient on Rocephin IV -Have ordered urine  culture, pending results  DVT prophylaxis: Lovenox subQ Code Status: Full Family Communication: Pt in room, family not at bedside Disposition Plan: Uncertain at this time  Consultants:     Procedures:     Antimicrobials: Anti-infectives    Start     Dose/Rate Route Frequency Ordered Stop   09/17/17 1200  cefTRIAXone (ROCEPHIN) 1 g in dextrose 5 % 50 mL IVPB     1 g 100 mL/hr over 30 Minutes Intravenous Every 24 hours 09/17/17 0940     09/17/17 0200  sulfamethoxazole-trimethoprim (BACTRIM,SEPTRA) 400-80 MG per tablet 1 tablet  Status:  Discontinued     1 tablet Oral Every 12 hours 09/17/17 0149 09/17/17 0940       Subjective: Feels somewhat better today  Objective: Vitals:   09/17/17 0319 09/17/17 0400 09/17/17 0634 09/17/17 1318  BP:   (!) 121/49 128/88  Pulse: (!) 58 68 67 68  Resp:   18 18  Temp:   97.8 F (36.6 C) 98 F (36.7 C)  TempSrc:   Oral Oral  SpO2:   94% 99%  Weight:      Height:        Intake/Output Summary (Last 24 hours) at 09/17/17 1345 Last data filed at 09/17/17 1009  Gross per 24 hour  Intake           2037.5 ml  Output                0 ml  Net           2037.5 ml   Filed Weights   09/16/17 2334  Weight: 112.7 kg (248 lb 7.3 oz)    Examination:  General exam: Appears calm and comfortable  Respiratory system: Clear to auscultation. Respiratory effort normal. Cardiovascular system: S1 & S2 heard, RRR. Gastrointestinal system: Abdomen is nondistended, soft and nontender. No organomegaly or masses felt. Normal bowel sounds heard. Central nervous system: Alert and oriented. No focal neurological deficits. Extremities: Symmetric 5 x 5 power. Skin: No rashes, lesions Psychiatry: Judgement and insight appear normal. Mood & affect appropriate.   Data Reviewed: I have personally reviewed following labs and imaging studies  CBC:  Recent Labs Lab 09/16/17 1907 09/17/17 0516  WBC 12.1* 9.8  HGB 17.0* 15.2*  HCT 47.1* 42.3  MCV 81.3  81.5  PLT 169 157   Basic Metabolic Panel:  Recent Labs Lab 09/16/17 1907 09/17/17 0516  NA 137 139  K 3.1* 2.9*  CL 94* 99*  CO2 26 26  GLUCOSE 165* 166*  BUN 141* 136*  CREATININE 2.34* 1.92*  CALCIUM 9.8 9.2  MG 3.2*  --    GFR: Estimated Creatinine Clearance: 36.9 mL/min (A) (by C-G formula based on SCr of 1.92 mg/dL (H)). Liver Function Tests:  Recent Labs Lab 09/16/17 1907  AST 26  ALT 19  ALKPHOS 70  BILITOT 1.1  PROT 9.0*  ALBUMIN 4.6   No results for input(s): LIPASE, AMYLASE in the last 168 hours. No results for input(s): AMMONIA in the last 168 hours. Coagulation Profile: No results for input(s): INR, PROTIME in the last 168 hours. Cardiac Enzymes:  Recent Labs Lab 09/16/17 1907  CKTOTAL 140   BNP (last 3 results) No results for input(s): PROBNP in the last 8760 hours. HbA1C: No results for input(s): HGBA1C in the last 72 hours. CBG:  Recent Labs Lab 09/17/17 0018 09/17/17 0923 09/17/17 1130  GLUCAP 175* 139* 224*   Lipid Profile: No results for input(s): CHOL, HDL, LDLCALC, TRIG, CHOLHDL, LDLDIRECT in the last 72 hours. Thyroid Function Tests: No results for input(s): TSH, T4TOTAL, FREET4, T3FREE, THYROIDAB in the last 72 hours. Anemia Panel: No results for input(s): VITAMINB12, FOLATE, FERRITIN, TIBC, IRON, RETICCTPCT in the last 72 hours. Sepsis Labs: No results for input(s): PROCALCITON, LATICACIDVEN in the last 168 hours.  No results found for this or any previous visit (from the past 240 hour(s)).   Radiology Studies: No results found.  Scheduled Meds: . cholecalciferol  2,000 Units Oral Daily  . enoxaparin (LOVENOX) injection  40 mg Subcutaneous QHS  . insulin aspart  0-15 Units Subcutaneous TID WC  . ketoconazole   Topical Daily  . potassium chloride  40 mEq Oral BID   Continuous Infusions: . sodium chloride 125 mL/hr at 09/17/17 0926  . cefTRIAXone (ROCEPHIN)  IV Stopped (09/17/17 1224)     LOS: 0 days   Mendy Chou,  Scheryl Marten, MD [facility] Pager [phone]  If 7PM-7AM, please contact night-coverage [email or web address] Password [email or web address] 09/17/2017, 1:45 PM

## 2017-09-18 DIAGNOSIS — I10 Essential (primary) hypertension: Secondary | ICD-10-CM

## 2017-09-18 DIAGNOSIS — R001 Bradycardia, unspecified: Secondary | ICD-10-CM | POA: Diagnosis not present

## 2017-09-18 DIAGNOSIS — N179 Acute kidney failure, unspecified: Secondary | ICD-10-CM | POA: Diagnosis not present

## 2017-09-18 LAB — GLUCOSE, CAPILLARY
GLUCOSE-CAPILLARY: 175 mg/dL — AB (ref 65–99)
Glucose-Capillary: 147 mg/dL — ABNORMAL HIGH (ref 65–99)

## 2017-09-18 LAB — BASIC METABOLIC PANEL
ANION GAP: 12 (ref 5–15)
BUN: 67 mg/dL — ABNORMAL HIGH (ref 6–20)
CO2: 22 mmol/L (ref 22–32)
Calcium: 9.1 mg/dL (ref 8.9–10.3)
Chloride: 110 mmol/L (ref 101–111)
Creatinine, Ser: 1.27 mg/dL — ABNORMAL HIGH (ref 0.44–1.00)
GFR calc Af Amer: 49 mL/min — ABNORMAL LOW (ref >60)
GFR calc non Af Amer: 42 mL/min — ABNORMAL LOW (ref >60)
GLUCOSE: 140 mg/dL — AB (ref 65–99)
Potassium: 3.8 mmol/L (ref 3.5–5.1)
Sodium: 144 mmol/L (ref 135–145)

## 2017-09-18 MED ORDER — FUROSEMIDE 40 MG PO TABS: 40.0000 mg | ORAL_TABLET | Freq: Every day | ORAL | 0 refills | Status: DC

## 2017-09-18 MED ORDER — FUROSEMIDE 40 MG PO TABS: 40.0000 mg | ORAL_TABLET | Freq: Every day | ORAL | 0 refills | Status: AC

## 2017-09-18 MED ORDER — CEFPODOXIME PROXETIL 200 MG PO TABS: 200.0000 mg | ORAL_TABLET | Freq: Two times a day (BID) | ORAL | 0 refills | Status: DC

## 2017-09-18 NOTE — Discharge Instructions (Signed)
Please start lasix on 09/21/17

## 2017-09-18 NOTE — Discharge Summary (Signed)
Physician Discharge Summary  Loretta [name] ZOX:[id number] DOB: [birth year] DOA: 09/16/2017  PCP: Darci Needle, MD  Admit date: 09/16/2017 Discharge date: 09/18/2017  Admitted From: Home Disposition:  Home  Recommendations for Outpatient Follow-up:  1. Follow up with PCP in 1-2 weeks 2. Please obtain BMP in 1 week  Discharge Condition:Improved CODE STATUS:Full Diet recommendation: heart healthy, diabetic   Brief/Interim Summary: 71 y.o.femalewith medical history significant of type 2 diabetes, hyperlipidemia, hypertension who was sent by her PCP to the emergency department due to abnormal lab results and hypotension. She takes Benicar HCT for hypertension and has been taking 80 mg of Lasix once a day, instead of 40 mg a day. However, per EDP notes the patient has been taking 80 mg po BID. she complains of postural lightheadedness and decreased urinary volume, but denies headache, chest pain, dyspnea, palpitations, diaphoresis, pitting edema lower extremities, abdominal pain, nausea, emesis, diarrhea, constipation, melena or hematochezia. She denies dysuria, frequency or hematuria.patient was found to have acute renal failure with UA suggestive of urinary tract infection.  Principal Problem: AKI (acute kidney injury) (HCC) -likely secondary to higher than usual furosemide dose. -patient had been on Benicar HCT 40/25 mg by mouth dailyprior to admission -was continued on continue IV fluid hydration as tolerated -Renal function improved with hydration. Recommend repeat bmet in one week  Active Problems: DM2 (diabetes mellitus, type 2) (HCC) -Carbohydrate modified diet. -Hold metformin secondary to presenting acute renal failure -on sliding scale insulin  HTN (hypertension) -Hold antihypertensives for now. -stable at present  Hypokalemia -Will replace -repeat basic metabolic panel in the morning  Bradycardia -held beta blocker time of admission -patient remained  stable  UTI -presenting urinalysis suggestive of UTI -Had started patient on Rocephin IV -Will complete course with PO vantin  Discharge Diagnoses:  Principal Problem:   AKI (acute kidney injury) (HCC) Active Problems:   DM2 (diabetes mellitus, type 2) (HCC)   HTN (hypertension)   Hypokalemia   Bradycardia    Discharge Instructions   Allergies as of 09/18/2017      Reactions   Pravastatin Other (See Comments)   Caused leg paralysis       Medication List    STOP taking these medications   oxyCODONE-acetaminophen 5-325 MG tablet Commonly known as:  PERCOCET/ROXICET     TAKE these medications   amLODipine 5 MG tablet Commonly known as:  NORVASC Take 5 mg by mouth daily.   aspirin EC 325 MG tablet Take 1 tablet (325 mg total) by mouth daily.   atorvastatin 20 MG tablet Commonly known as:  LIPITOR Take 20 mg by mouth at bedtime.   cefpodoxime 200 MG tablet Commonly known as:  VANTIN Take 1 tablet (200 mg total) by mouth 2 (two) times daily.   cloNIDine 0.1 MG tablet Commonly known as:  CATAPRES Take 0.1 mg by mouth at bedtime.   furosemide 40 MG tablet Commonly known as:  LASIX Take 1 tablet (40 mg total) by mouth daily. What changed:  how much to take   ketoconazole 2 % cream Commonly known as:  NIZORAL APPLY TO AFFECTED AREA ONCE DAILY EXTERNALLY FOR 14 DAYS   KLOR-CON M20 20 MEQ tablet Generic drug:  potassium chloride SA Take 20 mEq by mouth daily.   multivitamin with minerals Tabs tablet Take 1 tablet by mouth daily.   nebivolol 10 MG tablet Commonly known as:  BYSTOLIC Take 10 mg by mouth daily.   olmesartan-hydrochlorothiazide 40-25 MG tablet Commonly known as:  BENICAR HCT  Take 1 tablet by mouth daily.   Vitamin D (Ergocalciferol) 2000 units Caps Take 1 capsule by mouth daily.   XIGDUO XR 09-999 MG Tb24 Generic drug:  Dapagliflozin-Metformin HCl ER Take 1 tablet by mouth daily.            Discharge Care Instructions         Start     Ordered   09/18/17 0000  cefpodoxime (VANTIN) 200 MG tablet  2 times daily     09/18/17 0924   09/18/17 0000  furosemide (LASIX) 40 MG tablet  Daily    Comments:  Start on 09/21/17   09/18/17 1610     Follow-up Information    Home, [facility].   Specialty:  Home Health Services Why:  [facility] nursing/physical therapy Contact information: [address] [place] [address] [place] [address] [phone]        Darci Needle, MD. Schedule an appointment as soon as possible for a visit in 1 week(s).   Specialty:  Endocrinology Contact information: [address] [place] [address] [phone]          Allergies  Allergen Reactions  . Pravastatin Other (See Comments)    Caused leg paralysis     Procedures/Studies: US Renal  Result Date: 09/17/2017 CLINICAL DATA:  Acute renal injury EXAM: RENAL / URINARY TRACT ULTRASOUND COMPLETE COMPARISON:  None. FINDINGS: Right Kidney: Length: 12.4 cm. Echogenicity and renal cortical thickness are within normal limits. No mass, perinephric fluid, or hydronephrosis visualized. No sonographically demonstrable calculus or ureterectasis. Left Kidney: Length: 11.7 cm. Echogenicity and renal cortical thickness are within normal limits. No mass, perinephric fluid, or hydronephrosis visualized. No sonographically demonstrable calculus or ureterectasis. Bladder: Appears normal for degree of bladder distention. IMPRESSION: Study within normal limits. Electronically Signed   By: Bretta Bang III M.D.   On: 09/17/2017 14:23     Subjective: Eager to go home  Discharge Exam: Vitals:   09/18/17 0544 09/18/17 0807  BP: (!) 130/45 138/78  Pulse: 61 64  Resp: 18 16  Temp: 98.1 F (36.7 C) 97.8 F (36.6 C)  SpO2: 95% 97%   Vitals:   09/17/17 1318 09/17/17 2108 09/18/17 0544 09/18/17 0807  BP: 128/88 137/61 (!) 130/45 138/78  Pulse: 68 64 61 64  Resp: Temp: 98 F (36.7 C) 97.9 F (36.6 C) 98.1 F  (36.7 C) 97.8 F (36.6 C)  TempSrc: Oral Oral Oral Oral  SpO2: 99% 96% 95% 97%  Weight:    113.4 kg (250 lb)  Height:     (1.727 m)    General: Pt is alert, awake, not in acute distress Cardiovascular: RRR, S1/S2 +, no rubs, no gallops Respiratory: CTA bilaterally, no wheezing, no rhonchi Abdominal: Soft, NT, ND, bowel sounds + Extremities: no edema, no cyanosis   The results of significant diagnostics from this hospitalization (including imaging, microbiology, ancillary and laboratory) are listed below for reference.     Microbiology: No results found for this or any previous visit (from the past 240 hour(s)).   Labs: BNP (last 3 results) No results for input(s): BNP in the last 8760 hours. Basic Metabolic Panel:  Recent Labs Lab 09/16/17 1907 09/17/17 0516 09/18/17 0514  NA 137 139 144  K 3.1* 2.9* 3.8  CL 94* 99* 110  CO2 GLUCOSE 165* 166* 140*  BUN 141* 136* 67*  CREATININE 2.34* 1.92* 1.27*  CALCIUM 9.8 9.2 9.1  MG 3.2*  --   --    Liver Function Tests:  Recent Labs Lab 09/16/17 1907  AST 26  ALT 19  ALKPHOS 70  BILITOT 1.1  PROT 9.0*  ALBUMIN 4.6   No results for input(s): LIPASE, AMYLASE in the last 168 hours. No results for input(s): AMMONIA in the last 168 hours. CBC:  Recent Labs Lab 09/16/17 1907 09/17/17 0516  WBC 12.1* 9.8  HGB 17.0* 15.2*  HCT 47.1* 42.3  MCV 81.3 81.5  PLT 169 157   Cardiac Enzymes:  Recent Labs Lab 09/16/17 1907  CKTOTAL 140   BNP: Invalid input(s): POCBNP CBG:  Recent Labs Lab 09/17/17 0923 09/17/17 1130 09/17/17 1650 09/17/17 2112 09/18/17 0727  GLUCAP 139* 224* 152* 175* 147*   D-Dimer No results for input(s): DDIMER in the last 72 hours. Hgb A1c No results for input(s): HGBA1C in the last 72 hours. Lipid Profile No results for input(s): CHOL, HDL, LDLCALC, TRIG, CHOLHDL, LDLDIRECT in the last 72 hours. Thyroid function studies No results for input(s): TSH, T4TOTAL,  T3FREE, THYROIDAB in the last 72 hours.  Invalid input(s): FREET3 Anemia work up No results for input(s): VITAMINB12, FOLATE, FERRITIN, TIBC, IRON, RETICCTPCT in the last 72 hours. Urinalysis    Component Value Date/Time   COLORURINE YELLOW 09/16/2017 2220   APPEARANCEUR HAZY (A) 09/16/2017 2220   LABSPEC 1.011 09/16/2017 2220   PHURINE 5.0 09/16/2017 2220   GLUCOSEU 50 (A) 09/16/2017 2220   HGBUR NEGATIVE 09/16/2017 2220   BILIRUBINUR NEGATIVE 09/16/2017 2220   KETONESUR NEGATIVE 09/16/2017 2220   PROTEINUR NEGATIVE 09/16/2017 2220   NITRITE POSITIVE (A) 09/16/2017 2220   LEUKOCYTESUR LARGE (A) 09/16/2017 2220   Sepsis Labs Invalid input(s): PROCALCITONIN,  WBC,  LACTICIDVEN Microbiology No results found for this or any previous visit (from the past 240 hour(s)).   SIGNED:   Jerald Kief, MD  [facility] 09/18/2017, 9:25 AM  If 7PM-7AM, please contact night-coverage [email or web address] Password [email or web address]

## 2017-09-18 NOTE — Progress Notes (Signed)
Patient discharged to home via [facility] cab company, patient will be paying for cab. Discharge instructions reviewed with patient who verbalized understanding.RX's were faxed to [facility] on [address] per Dr Rhona Leavens. Message was left for son Theron Arista on his voice mail to advise him that patient was being discharged. No return call.

## 2017-09-18 NOTE — Care Management Note (Signed)
Case Management Note  Patient Details  Name: Loretta [name] MRN: [id number] Date of Birth: 04-08-1946  Subjective/Objective:    AKI, DM, HTN                Action/Plan: Discharge Planning: Please see previous NCM notes. Pt is active with [facility]. Orders are in EPIC. Notified agency of scheduled dc home today. Pt has RW, cane and wheelchair at home. Declines 3n1 bedside commode at this time. States her son will assist her at home as needed.   PCP Darci Needle MD  Expected Discharge Date:  09/18/17               Expected Discharge Plan:  Home w Home Health Services  In-House Referral:  NA  Discharge planning Services  CM Consult  Post Acute Care Choice:  Home Health, Resumption of Svcs/PTA Provider (Active w/[facility]-HHRN/HHPT) Choice offered to:  Patient  DME Arranged:  N/A DME Agency:  NA  HH Arranged:  RN, PT HH Agency:  [facility] ([facility]  Status of Service:  Completed, signed off  If discussed at [employer], dates discussed:    Additional Comments:  Elliot Cousin, RN 09/18/2017, 10:48 AM

## 2017-09-20 DIAGNOSIS — I10 Essential (primary) hypertension: Secondary | ICD-10-CM | POA: Diagnosis not present

## 2017-09-20 DIAGNOSIS — E119 Type 2 diabetes mellitus without complications: Secondary | ICD-10-CM | POA: Diagnosis not present

## 2017-09-20 DIAGNOSIS — I2510 Atherosclerotic heart disease of native coronary artery without angina pectoris: Secondary | ICD-10-CM | POA: Diagnosis not present

## 2017-09-20 DIAGNOSIS — E785 Hyperlipidemia, unspecified: Secondary | ICD-10-CM | POA: Diagnosis not present

## 2017-09-20 DIAGNOSIS — Z9181 History of falling: Secondary | ICD-10-CM | POA: Diagnosis not present

## 2017-09-20 DIAGNOSIS — Z7982 Long term (current) use of aspirin: Secondary | ICD-10-CM | POA: Diagnosis not present

## 2017-09-20 DIAGNOSIS — Z7984 Long term (current) use of oral hypoglycemic drugs: Secondary | ICD-10-CM | POA: Diagnosis not present

## 2017-09-20 DIAGNOSIS — S82891D Other fracture of right lower leg, subsequent encounter for closed fracture with routine healing: Secondary | ICD-10-CM | POA: Diagnosis not present

## 2017-09-20 DIAGNOSIS — S8261XD Displaced fracture of lateral malleolus of right fibula, subsequent encounter for closed fracture with routine healing: Secondary | ICD-10-CM | POA: Diagnosis not present

## 2017-09-20 LAB — URINE CULTURE

## 2017-09-22 DIAGNOSIS — S82891D Other fracture of right lower leg, subsequent encounter for closed fracture with routine healing: Secondary | ICD-10-CM | POA: Diagnosis not present

## 2017-09-22 DIAGNOSIS — Z9181 History of falling: Secondary | ICD-10-CM | POA: Diagnosis not present

## 2017-09-22 DIAGNOSIS — I10 Essential (primary) hypertension: Secondary | ICD-10-CM | POA: Diagnosis not present

## 2017-09-22 DIAGNOSIS — E785 Hyperlipidemia, unspecified: Secondary | ICD-10-CM | POA: Diagnosis not present

## 2017-09-22 DIAGNOSIS — Z7982 Long term (current) use of aspirin: Secondary | ICD-10-CM | POA: Diagnosis not present

## 2017-09-22 DIAGNOSIS — I2510 Atherosclerotic heart disease of native coronary artery without angina pectoris: Secondary | ICD-10-CM | POA: Diagnosis not present

## 2017-09-22 DIAGNOSIS — S8261XD Displaced fracture of lateral malleolus of right fibula, subsequent encounter for closed fracture with routine healing: Secondary | ICD-10-CM | POA: Diagnosis not present

## 2017-09-22 DIAGNOSIS — Z7984 Long term (current) use of oral hypoglycemic drugs: Secondary | ICD-10-CM | POA: Diagnosis not present

## 2017-09-22 DIAGNOSIS — E119 Type 2 diabetes mellitus without complications: Secondary | ICD-10-CM | POA: Diagnosis not present

## 2017-09-23 DIAGNOSIS — Z7982 Long term (current) use of aspirin: Secondary | ICD-10-CM | POA: Diagnosis not present

## 2017-09-23 DIAGNOSIS — S82891D Other fracture of right lower leg, subsequent encounter for closed fracture with routine healing: Secondary | ICD-10-CM | POA: Diagnosis not present

## 2017-09-23 DIAGNOSIS — I10 Essential (primary) hypertension: Secondary | ICD-10-CM | POA: Diagnosis not present

## 2017-09-23 DIAGNOSIS — Z9181 History of falling: Secondary | ICD-10-CM | POA: Diagnosis not present

## 2017-09-23 DIAGNOSIS — S8261XD Displaced fracture of lateral malleolus of right fibula, subsequent encounter for closed fracture with routine healing: Secondary | ICD-10-CM | POA: Diagnosis not present

## 2017-09-23 DIAGNOSIS — Z7984 Long term (current) use of oral hypoglycemic drugs: Secondary | ICD-10-CM | POA: Diagnosis not present

## 2017-09-23 DIAGNOSIS — E785 Hyperlipidemia, unspecified: Secondary | ICD-10-CM | POA: Diagnosis not present

## 2017-09-23 DIAGNOSIS — E119 Type 2 diabetes mellitus without complications: Secondary | ICD-10-CM | POA: Diagnosis not present

## 2017-09-23 DIAGNOSIS — I2510 Atherosclerotic heart disease of native coronary artery without angina pectoris: Secondary | ICD-10-CM | POA: Diagnosis not present

## 2017-09-24 DIAGNOSIS — I10 Essential (primary) hypertension: Secondary | ICD-10-CM | POA: Diagnosis not present

## 2017-09-24 DIAGNOSIS — I2510 Atherosclerotic heart disease of native coronary artery without angina pectoris: Secondary | ICD-10-CM | POA: Diagnosis not present

## 2017-09-24 DIAGNOSIS — S8261XD Displaced fracture of lateral malleolus of right fibula, subsequent encounter for closed fracture with routine healing: Secondary | ICD-10-CM | POA: Diagnosis not present

## 2017-09-24 DIAGNOSIS — E785 Hyperlipidemia, unspecified: Secondary | ICD-10-CM | POA: Diagnosis not present

## 2017-09-24 DIAGNOSIS — S82891D Other fracture of right lower leg, subsequent encounter for closed fracture with routine healing: Secondary | ICD-10-CM | POA: Diagnosis not present

## 2017-09-24 DIAGNOSIS — Z7982 Long term (current) use of aspirin: Secondary | ICD-10-CM | POA: Diagnosis not present

## 2017-09-24 DIAGNOSIS — E119 Type 2 diabetes mellitus without complications: Secondary | ICD-10-CM | POA: Diagnosis not present

## 2017-09-24 DIAGNOSIS — Z7984 Long term (current) use of oral hypoglycemic drugs: Secondary | ICD-10-CM | POA: Diagnosis not present

## 2017-09-24 DIAGNOSIS — Z9181 History of falling: Secondary | ICD-10-CM | POA: Diagnosis not present

## 2017-09-27 DIAGNOSIS — I10 Essential (primary) hypertension: Secondary | ICD-10-CM | POA: Diagnosis not present

## 2017-09-27 DIAGNOSIS — Z7982 Long term (current) use of aspirin: Secondary | ICD-10-CM | POA: Diagnosis not present

## 2017-09-27 DIAGNOSIS — E785 Hyperlipidemia, unspecified: Secondary | ICD-10-CM | POA: Diagnosis not present

## 2017-09-27 DIAGNOSIS — I2510 Atherosclerotic heart disease of native coronary artery without angina pectoris: Secondary | ICD-10-CM | POA: Diagnosis not present

## 2017-09-27 DIAGNOSIS — S82891D Other fracture of right lower leg, subsequent encounter for closed fracture with routine healing: Secondary | ICD-10-CM | POA: Diagnosis not present

## 2017-09-27 DIAGNOSIS — S8261XD Displaced fracture of lateral malleolus of right fibula, subsequent encounter for closed fracture with routine healing: Secondary | ICD-10-CM | POA: Diagnosis not present

## 2017-09-27 DIAGNOSIS — E119 Type 2 diabetes mellitus without complications: Secondary | ICD-10-CM | POA: Diagnosis not present

## 2017-09-27 DIAGNOSIS — Z9181 History of falling: Secondary | ICD-10-CM | POA: Diagnosis not present

## 2017-09-27 DIAGNOSIS — Z7984 Long term (current) use of oral hypoglycemic drugs: Secondary | ICD-10-CM | POA: Diagnosis not present

## 2017-09-28 DIAGNOSIS — I10 Essential (primary) hypertension: Secondary | ICD-10-CM | POA: Diagnosis not present

## 2017-09-28 DIAGNOSIS — Z09 Encounter for follow-up examination after completed treatment for conditions other than malignant neoplasm: Secondary | ICD-10-CM | POA: Diagnosis not present

## 2017-09-28 DIAGNOSIS — E119 Type 2 diabetes mellitus without complications: Secondary | ICD-10-CM | POA: Diagnosis not present

## 2017-09-30 DIAGNOSIS — M6281 Muscle weakness (generalized): Secondary | ICD-10-CM | POA: Diagnosis not present

## 2017-09-30 DIAGNOSIS — R269 Unspecified abnormalities of gait and mobility: Secondary | ICD-10-CM | POA: Diagnosis not present

## 2017-10-01 DIAGNOSIS — Z7982 Long term (current) use of aspirin: Secondary | ICD-10-CM | POA: Diagnosis not present

## 2017-10-01 DIAGNOSIS — E785 Hyperlipidemia, unspecified: Secondary | ICD-10-CM | POA: Diagnosis not present

## 2017-10-01 DIAGNOSIS — I10 Essential (primary) hypertension: Secondary | ICD-10-CM | POA: Diagnosis not present

## 2017-10-01 DIAGNOSIS — S82891D Other fracture of right lower leg, subsequent encounter for closed fracture with routine healing: Secondary | ICD-10-CM | POA: Diagnosis not present

## 2017-10-01 DIAGNOSIS — Z7984 Long term (current) use of oral hypoglycemic drugs: Secondary | ICD-10-CM | POA: Diagnosis not present

## 2017-10-01 DIAGNOSIS — Z9181 History of falling: Secondary | ICD-10-CM | POA: Diagnosis not present

## 2017-10-01 DIAGNOSIS — S8261XD Displaced fracture of lateral malleolus of right fibula, subsequent encounter for closed fracture with routine healing: Secondary | ICD-10-CM | POA: Diagnosis not present

## 2017-10-01 DIAGNOSIS — I2510 Atherosclerotic heart disease of native coronary artery without angina pectoris: Secondary | ICD-10-CM | POA: Diagnosis not present

## 2017-10-01 DIAGNOSIS — E119 Type 2 diabetes mellitus without complications: Secondary | ICD-10-CM | POA: Diagnosis not present

## 2017-10-04 DIAGNOSIS — Z7982 Long term (current) use of aspirin: Secondary | ICD-10-CM | POA: Diagnosis not present

## 2017-10-04 DIAGNOSIS — E119 Type 2 diabetes mellitus without complications: Secondary | ICD-10-CM | POA: Diagnosis not present

## 2017-10-04 DIAGNOSIS — S82891D Other fracture of right lower leg, subsequent encounter for closed fracture with routine healing: Secondary | ICD-10-CM | POA: Diagnosis not present

## 2017-10-04 DIAGNOSIS — Z9181 History of falling: Secondary | ICD-10-CM | POA: Diagnosis not present

## 2017-10-04 DIAGNOSIS — S8261XD Displaced fracture of lateral malleolus of right fibula, subsequent encounter for closed fracture with routine healing: Secondary | ICD-10-CM | POA: Diagnosis not present

## 2017-10-04 DIAGNOSIS — I2510 Atherosclerotic heart disease of native coronary artery without angina pectoris: Secondary | ICD-10-CM | POA: Diagnosis not present

## 2017-10-04 DIAGNOSIS — E785 Hyperlipidemia, unspecified: Secondary | ICD-10-CM | POA: Diagnosis not present

## 2017-10-04 DIAGNOSIS — Z7984 Long term (current) use of oral hypoglycemic drugs: Secondary | ICD-10-CM | POA: Diagnosis not present

## 2017-10-04 DIAGNOSIS — I10 Essential (primary) hypertension: Secondary | ICD-10-CM | POA: Diagnosis not present

## 2017-10-05 DIAGNOSIS — E785 Hyperlipidemia, unspecified: Secondary | ICD-10-CM | POA: Diagnosis not present

## 2017-10-05 DIAGNOSIS — Z9181 History of falling: Secondary | ICD-10-CM | POA: Diagnosis not present

## 2017-10-05 DIAGNOSIS — Z7984 Long term (current) use of oral hypoglycemic drugs: Secondary | ICD-10-CM | POA: Diagnosis not present

## 2017-10-05 DIAGNOSIS — S8261XD Displaced fracture of lateral malleolus of right fibula, subsequent encounter for closed fracture with routine healing: Secondary | ICD-10-CM | POA: Diagnosis not present

## 2017-10-05 DIAGNOSIS — I2510 Atherosclerotic heart disease of native coronary artery without angina pectoris: Secondary | ICD-10-CM | POA: Diagnosis not present

## 2017-10-05 DIAGNOSIS — I10 Essential (primary) hypertension: Secondary | ICD-10-CM | POA: Diagnosis not present

## 2017-10-05 DIAGNOSIS — S82891D Other fracture of right lower leg, subsequent encounter for closed fracture with routine healing: Secondary | ICD-10-CM | POA: Diagnosis not present

## 2017-10-05 DIAGNOSIS — E119 Type 2 diabetes mellitus without complications: Secondary | ICD-10-CM | POA: Diagnosis not present

## 2017-10-05 DIAGNOSIS — Z7982 Long term (current) use of aspirin: Secondary | ICD-10-CM | POA: Diagnosis not present

## 2017-10-06 DIAGNOSIS — N289 Disorder of kidney and ureter, unspecified: Secondary | ICD-10-CM | POA: Diagnosis not present

## 2017-10-06 DIAGNOSIS — I10 Essential (primary) hypertension: Secondary | ICD-10-CM | POA: Diagnosis not present

## 2017-10-06 DIAGNOSIS — E118 Type 2 diabetes mellitus with unspecified complications: Secondary | ICD-10-CM | POA: Diagnosis not present

## 2017-10-06 DIAGNOSIS — Z23 Encounter for immunization: Secondary | ICD-10-CM | POA: Diagnosis not present

## 2017-10-06 DIAGNOSIS — E876 Hypokalemia: Secondary | ICD-10-CM | POA: Diagnosis not present

## 2017-10-06 DIAGNOSIS — N179 Acute kidney failure, unspecified: Secondary | ICD-10-CM | POA: Diagnosis not present

## 2017-10-08 DIAGNOSIS — S82891D Other fracture of right lower leg, subsequent encounter for closed fracture with routine healing: Secondary | ICD-10-CM | POA: Diagnosis not present

## 2017-10-08 DIAGNOSIS — E785 Hyperlipidemia, unspecified: Secondary | ICD-10-CM | POA: Diagnosis not present

## 2017-10-08 DIAGNOSIS — I10 Essential (primary) hypertension: Secondary | ICD-10-CM | POA: Diagnosis not present

## 2017-10-08 DIAGNOSIS — E119 Type 2 diabetes mellitus without complications: Secondary | ICD-10-CM | POA: Diagnosis not present

## 2017-10-08 DIAGNOSIS — S8261XD Displaced fracture of lateral malleolus of right fibula, subsequent encounter for closed fracture with routine healing: Secondary | ICD-10-CM | POA: Diagnosis not present

## 2017-10-08 DIAGNOSIS — Z9181 History of falling: Secondary | ICD-10-CM | POA: Diagnosis not present

## 2017-10-08 DIAGNOSIS — Z7982 Long term (current) use of aspirin: Secondary | ICD-10-CM | POA: Diagnosis not present

## 2017-10-08 DIAGNOSIS — Z7984 Long term (current) use of oral hypoglycemic drugs: Secondary | ICD-10-CM | POA: Diagnosis not present

## 2017-10-08 DIAGNOSIS — I2510 Atherosclerotic heart disease of native coronary artery without angina pectoris: Secondary | ICD-10-CM | POA: Diagnosis not present

## 2017-10-11 DIAGNOSIS — Z7982 Long term (current) use of aspirin: Secondary | ICD-10-CM | POA: Diagnosis not present

## 2017-10-11 DIAGNOSIS — S8261XD Displaced fracture of lateral malleolus of right fibula, subsequent encounter for closed fracture with routine healing: Secondary | ICD-10-CM | POA: Diagnosis not present

## 2017-10-11 DIAGNOSIS — I2510 Atherosclerotic heart disease of native coronary artery without angina pectoris: Secondary | ICD-10-CM | POA: Diagnosis not present

## 2017-10-11 DIAGNOSIS — Z7984 Long term (current) use of oral hypoglycemic drugs: Secondary | ICD-10-CM | POA: Diagnosis not present

## 2017-10-11 DIAGNOSIS — E785 Hyperlipidemia, unspecified: Secondary | ICD-10-CM | POA: Diagnosis not present

## 2017-10-11 DIAGNOSIS — Z9181 History of falling: Secondary | ICD-10-CM | POA: Diagnosis not present

## 2017-10-11 DIAGNOSIS — I10 Essential (primary) hypertension: Secondary | ICD-10-CM | POA: Diagnosis not present

## 2017-10-11 DIAGNOSIS — E119 Type 2 diabetes mellitus without complications: Secondary | ICD-10-CM | POA: Diagnosis not present

## 2017-10-11 DIAGNOSIS — S82891D Other fracture of right lower leg, subsequent encounter for closed fracture with routine healing: Secondary | ICD-10-CM | POA: Diagnosis not present

## 2017-10-14 DIAGNOSIS — S8261XD Displaced fracture of lateral malleolus of right fibula, subsequent encounter for closed fracture with routine healing: Secondary | ICD-10-CM | POA: Diagnosis not present

## 2017-10-14 DIAGNOSIS — E785 Hyperlipidemia, unspecified: Secondary | ICD-10-CM | POA: Diagnosis not present

## 2017-10-14 DIAGNOSIS — Z9181 History of falling: Secondary | ICD-10-CM | POA: Diagnosis not present

## 2017-10-14 DIAGNOSIS — Z7982 Long term (current) use of aspirin: Secondary | ICD-10-CM | POA: Diagnosis not present

## 2017-10-14 DIAGNOSIS — E119 Type 2 diabetes mellitus without complications: Secondary | ICD-10-CM | POA: Diagnosis not present

## 2017-10-14 DIAGNOSIS — I2510 Atherosclerotic heart disease of native coronary artery without angina pectoris: Secondary | ICD-10-CM | POA: Diagnosis not present

## 2017-10-14 DIAGNOSIS — I10 Essential (primary) hypertension: Secondary | ICD-10-CM | POA: Diagnosis not present

## 2017-10-14 DIAGNOSIS — Z7984 Long term (current) use of oral hypoglycemic drugs: Secondary | ICD-10-CM | POA: Diagnosis not present

## 2017-10-14 DIAGNOSIS — S82891D Other fracture of right lower leg, subsequent encounter for closed fracture with routine healing: Secondary | ICD-10-CM | POA: Diagnosis not present

## 2017-10-15 DIAGNOSIS — Z7984 Long term (current) use of oral hypoglycemic drugs: Secondary | ICD-10-CM | POA: Diagnosis not present

## 2017-10-15 DIAGNOSIS — Z9181 History of falling: Secondary | ICD-10-CM | POA: Diagnosis not present

## 2017-10-15 DIAGNOSIS — S8261XD Displaced fracture of lateral malleolus of right fibula, subsequent encounter for closed fracture with routine healing: Secondary | ICD-10-CM | POA: Diagnosis not present

## 2017-10-15 DIAGNOSIS — S82891D Other fracture of right lower leg, subsequent encounter for closed fracture with routine healing: Secondary | ICD-10-CM | POA: Diagnosis not present

## 2017-10-15 DIAGNOSIS — E785 Hyperlipidemia, unspecified: Secondary | ICD-10-CM | POA: Diagnosis not present

## 2017-10-15 DIAGNOSIS — Z7982 Long term (current) use of aspirin: Secondary | ICD-10-CM | POA: Diagnosis not present

## 2017-10-15 DIAGNOSIS — I10 Essential (primary) hypertension: Secondary | ICD-10-CM | POA: Diagnosis not present

## 2017-10-15 DIAGNOSIS — E119 Type 2 diabetes mellitus without complications: Secondary | ICD-10-CM | POA: Diagnosis not present

## 2017-10-15 DIAGNOSIS — I2510 Atherosclerotic heart disease of native coronary artery without angina pectoris: Secondary | ICD-10-CM | POA: Diagnosis not present

## 2017-10-18 DIAGNOSIS — S82891D Other fracture of right lower leg, subsequent encounter for closed fracture with routine healing: Secondary | ICD-10-CM | POA: Diagnosis not present

## 2017-10-18 DIAGNOSIS — Z7982 Long term (current) use of aspirin: Secondary | ICD-10-CM | POA: Diagnosis not present

## 2017-10-18 DIAGNOSIS — I2510 Atherosclerotic heart disease of native coronary artery without angina pectoris: Secondary | ICD-10-CM | POA: Diagnosis not present

## 2017-10-18 DIAGNOSIS — Z7984 Long term (current) use of oral hypoglycemic drugs: Secondary | ICD-10-CM | POA: Diagnosis not present

## 2017-10-18 DIAGNOSIS — E785 Hyperlipidemia, unspecified: Secondary | ICD-10-CM | POA: Diagnosis not present

## 2017-10-18 DIAGNOSIS — I10 Essential (primary) hypertension: Secondary | ICD-10-CM | POA: Diagnosis not present

## 2017-10-18 DIAGNOSIS — Z9181 History of falling: Secondary | ICD-10-CM | POA: Diagnosis not present

## 2017-10-18 DIAGNOSIS — S8261XD Displaced fracture of lateral malleolus of right fibula, subsequent encounter for closed fracture with routine healing: Secondary | ICD-10-CM | POA: Diagnosis not present

## 2017-10-18 DIAGNOSIS — E119 Type 2 diabetes mellitus without complications: Secondary | ICD-10-CM | POA: Diagnosis not present

## 2017-10-25 DIAGNOSIS — I10 Essential (primary) hypertension: Secondary | ICD-10-CM | POA: Diagnosis not present

## 2017-10-25 DIAGNOSIS — S8261XD Displaced fracture of lateral malleolus of right fibula, subsequent encounter for closed fracture with routine healing: Secondary | ICD-10-CM | POA: Diagnosis not present

## 2017-10-25 DIAGNOSIS — Z9181 History of falling: Secondary | ICD-10-CM | POA: Diagnosis not present

## 2017-10-25 DIAGNOSIS — Z7982 Long term (current) use of aspirin: Secondary | ICD-10-CM | POA: Diagnosis not present

## 2017-10-25 DIAGNOSIS — E119 Type 2 diabetes mellitus without complications: Secondary | ICD-10-CM | POA: Diagnosis not present

## 2017-10-25 DIAGNOSIS — E785 Hyperlipidemia, unspecified: Secondary | ICD-10-CM | POA: Diagnosis not present

## 2017-10-25 DIAGNOSIS — S82891D Other fracture of right lower leg, subsequent encounter for closed fracture with routine healing: Secondary | ICD-10-CM | POA: Diagnosis not present

## 2017-10-25 DIAGNOSIS — Z7984 Long term (current) use of oral hypoglycemic drugs: Secondary | ICD-10-CM | POA: Diagnosis not present

## 2017-10-25 DIAGNOSIS — I2510 Atherosclerotic heart disease of native coronary artery without angina pectoris: Secondary | ICD-10-CM | POA: Diagnosis not present

## 2017-10-29 DIAGNOSIS — Z7982 Long term (current) use of aspirin: Secondary | ICD-10-CM | POA: Diagnosis not present

## 2017-10-29 DIAGNOSIS — I10 Essential (primary) hypertension: Secondary | ICD-10-CM | POA: Diagnosis not present

## 2017-10-29 DIAGNOSIS — I2510 Atherosclerotic heart disease of native coronary artery without angina pectoris: Secondary | ICD-10-CM | POA: Diagnosis not present

## 2017-10-29 DIAGNOSIS — E785 Hyperlipidemia, unspecified: Secondary | ICD-10-CM | POA: Diagnosis not present

## 2017-10-29 DIAGNOSIS — E119 Type 2 diabetes mellitus without complications: Secondary | ICD-10-CM | POA: Diagnosis not present

## 2017-10-29 DIAGNOSIS — Z9181 History of falling: Secondary | ICD-10-CM | POA: Diagnosis not present

## 2017-10-29 DIAGNOSIS — Z7984 Long term (current) use of oral hypoglycemic drugs: Secondary | ICD-10-CM | POA: Diagnosis not present

## 2017-10-29 DIAGNOSIS — S82891D Other fracture of right lower leg, subsequent encounter for closed fracture with routine healing: Secondary | ICD-10-CM | POA: Diagnosis not present

## 2017-10-29 DIAGNOSIS — S8261XD Displaced fracture of lateral malleolus of right fibula, subsequent encounter for closed fracture with routine healing: Secondary | ICD-10-CM | POA: Diagnosis not present

## 2017-10-31 DIAGNOSIS — M6281 Muscle weakness (generalized): Secondary | ICD-10-CM | POA: Diagnosis not present

## 2017-10-31 DIAGNOSIS — R269 Unspecified abnormalities of gait and mobility: Secondary | ICD-10-CM | POA: Diagnosis not present

## 2017-11-09 DIAGNOSIS — I129 Hypertensive chronic kidney disease with stage 1 through stage 4 chronic kidney disease, or unspecified chronic kidney disease: Secondary | ICD-10-CM | POA: Diagnosis not present

## 2017-11-09 DIAGNOSIS — N289 Disorder of kidney and ureter, unspecified: Secondary | ICD-10-CM | POA: Diagnosis not present

## 2017-11-09 DIAGNOSIS — E118 Type 2 diabetes mellitus with unspecified complications: Secondary | ICD-10-CM | POA: Diagnosis not present

## 2017-11-09 DIAGNOSIS — N183 Chronic kidney disease, stage 3 (moderate): Secondary | ICD-10-CM | POA: Diagnosis not present

## 2017-11-09 DIAGNOSIS — E1121 Type 2 diabetes mellitus with diabetic nephropathy: Secondary | ICD-10-CM | POA: Diagnosis not present

## 2017-11-30 DIAGNOSIS — I10 Essential (primary) hypertension: Secondary | ICD-10-CM | POA: Diagnosis not present

## 2017-11-30 DIAGNOSIS — M6281 Muscle weakness (generalized): Secondary | ICD-10-CM | POA: Diagnosis not present

## 2017-11-30 DIAGNOSIS — E119 Type 2 diabetes mellitus without complications: Secondary | ICD-10-CM | POA: Diagnosis not present

## 2017-11-30 DIAGNOSIS — I2510 Atherosclerotic heart disease of native coronary artery without angina pectoris: Secondary | ICD-10-CM | POA: Diagnosis not present

## 2017-11-30 DIAGNOSIS — S8261XD Displaced fracture of lateral malleolus of right fibula, subsequent encounter for closed fracture with routine healing: Secondary | ICD-10-CM | POA: Diagnosis not present

## 2017-11-30 DIAGNOSIS — R269 Unspecified abnormalities of gait and mobility: Secondary | ICD-10-CM | POA: Diagnosis not present

## 2017-12-07 DIAGNOSIS — I10 Essential (primary) hypertension: Secondary | ICD-10-CM | POA: Diagnosis not present

## 2017-12-07 DIAGNOSIS — E1121 Type 2 diabetes mellitus with diabetic nephropathy: Secondary | ICD-10-CM | POA: Diagnosis not present

## 2017-12-07 DIAGNOSIS — E1122 Type 2 diabetes mellitus with diabetic chronic kidney disease: Secondary | ICD-10-CM | POA: Diagnosis not present

## 2017-12-07 DIAGNOSIS — I129 Hypertensive chronic kidney disease with stage 1 through stage 4 chronic kidney disease, or unspecified chronic kidney disease: Secondary | ICD-10-CM | POA: Diagnosis not present

## 2017-12-07 DIAGNOSIS — N183 Chronic kidney disease, stage 3 (moderate): Secondary | ICD-10-CM | POA: Diagnosis not present

## 2017-12-07 DIAGNOSIS — E118 Type 2 diabetes mellitus with unspecified complications: Secondary | ICD-10-CM | POA: Diagnosis not present

## 2017-12-07 DIAGNOSIS — E782 Mixed hyperlipidemia: Secondary | ICD-10-CM | POA: Diagnosis not present

## 2017-12-31 DIAGNOSIS — M6281 Muscle weakness (generalized): Secondary | ICD-10-CM | POA: Diagnosis not present

## 2017-12-31 DIAGNOSIS — R269 Unspecified abnormalities of gait and mobility: Secondary | ICD-10-CM | POA: Diagnosis not present

## 2018-01-18 DIAGNOSIS — E1121 Type 2 diabetes mellitus with diabetic nephropathy: Secondary | ICD-10-CM | POA: Diagnosis not present

## 2018-01-18 DIAGNOSIS — E1122 Type 2 diabetes mellitus with diabetic chronic kidney disease: Secondary | ICD-10-CM | POA: Diagnosis not present

## 2018-01-18 DIAGNOSIS — R600 Localized edema: Secondary | ICD-10-CM | POA: Diagnosis not present

## 2018-01-18 DIAGNOSIS — I129 Hypertensive chronic kidney disease with stage 1 through stage 4 chronic kidney disease, or unspecified chronic kidney disease: Secondary | ICD-10-CM | POA: Diagnosis not present

## 2018-01-18 DIAGNOSIS — N183 Chronic kidney disease, stage 3 (moderate): Secondary | ICD-10-CM | POA: Diagnosis not present

## 2018-01-31 DIAGNOSIS — M6281 Muscle weakness (generalized): Secondary | ICD-10-CM | POA: Diagnosis not present

## 2018-01-31 DIAGNOSIS — R269 Unspecified abnormalities of gait and mobility: Secondary | ICD-10-CM | POA: Diagnosis not present

## 2018-02-28 DIAGNOSIS — M6281 Muscle weakness (generalized): Secondary | ICD-10-CM | POA: Diagnosis not present

## 2018-02-28 DIAGNOSIS — R269 Unspecified abnormalities of gait and mobility: Secondary | ICD-10-CM | POA: Diagnosis not present

## 2018-03-10 DIAGNOSIS — N390 Urinary tract infection, site not specified: Secondary | ICD-10-CM | POA: Diagnosis not present

## 2018-03-10 DIAGNOSIS — I129 Hypertensive chronic kidney disease with stage 1 through stage 4 chronic kidney disease, or unspecified chronic kidney disease: Secondary | ICD-10-CM | POA: Diagnosis not present

## 2018-03-10 DIAGNOSIS — Z0000 Encounter for general adult medical examination without abnormal findings: Secondary | ICD-10-CM | POA: Diagnosis not present

## 2018-03-10 DIAGNOSIS — N183 Chronic kidney disease, stage 3 (moderate): Secondary | ICD-10-CM | POA: Diagnosis not present

## 2018-03-10 DIAGNOSIS — R600 Localized edema: Secondary | ICD-10-CM | POA: Diagnosis not present

## 2018-03-10 DIAGNOSIS — E1121 Type 2 diabetes mellitus with diabetic nephropathy: Secondary | ICD-10-CM | POA: Diagnosis not present

## 2018-03-10 DIAGNOSIS — E1122 Type 2 diabetes mellitus with diabetic chronic kidney disease: Secondary | ICD-10-CM | POA: Diagnosis not present

## 2018-03-10 DIAGNOSIS — E782 Mixed hyperlipidemia: Secondary | ICD-10-CM | POA: Diagnosis not present

## 2018-03-18 DIAGNOSIS — N183 Chronic kidney disease, stage 3 (moderate): Secondary | ICD-10-CM | POA: Diagnosis not present

## 2018-03-18 DIAGNOSIS — E1121 Type 2 diabetes mellitus with diabetic nephropathy: Secondary | ICD-10-CM | POA: Diagnosis not present

## 2018-03-18 DIAGNOSIS — I129 Hypertensive chronic kidney disease with stage 1 through stage 4 chronic kidney disease, or unspecified chronic kidney disease: Secondary | ICD-10-CM | POA: Diagnosis not present

## 2018-03-18 DIAGNOSIS — R600 Localized edema: Secondary | ICD-10-CM | POA: Diagnosis not present

## 2018-03-18 DIAGNOSIS — E1122 Type 2 diabetes mellitus with diabetic chronic kidney disease: Secondary | ICD-10-CM | POA: Diagnosis not present

## 2018-05-26 DIAGNOSIS — E1122 Type 2 diabetes mellitus with diabetic chronic kidney disease: Secondary | ICD-10-CM | POA: Diagnosis not present

## 2018-05-26 DIAGNOSIS — E1121 Type 2 diabetes mellitus with diabetic nephropathy: Secondary | ICD-10-CM | POA: Diagnosis not present

## 2018-05-26 DIAGNOSIS — R600 Localized edema: Secondary | ICD-10-CM | POA: Diagnosis not present

## 2018-05-26 DIAGNOSIS — N183 Chronic kidney disease, stage 3 (moderate): Secondary | ICD-10-CM | POA: Diagnosis not present

## 2018-05-26 DIAGNOSIS — E876 Hypokalemia: Secondary | ICD-10-CM | POA: Diagnosis not present

## 2018-05-26 DIAGNOSIS — I129 Hypertensive chronic kidney disease with stage 1 through stage 4 chronic kidney disease, or unspecified chronic kidney disease: Secondary | ICD-10-CM | POA: Diagnosis not present

## 2018-06-08 IMAGING — US US RENAL
1 series · 14 of 25 positions shown · non-contrast
Comparison: None.

CLINICAL DATA: Acute renal injury

EXAM:
RENAL / URINARY TRACT ULTRASOUND COMPLETE

[Series 1: us renal · 0.26mm/px · 14 of 40 slices shown]
[im 1/40]
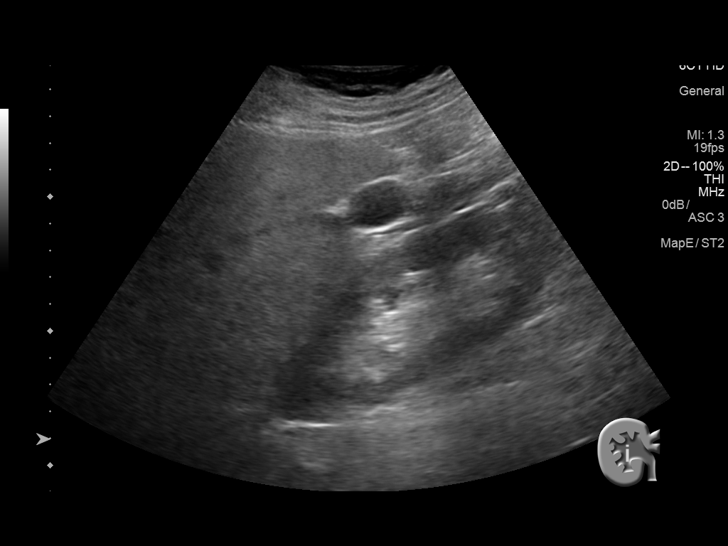
[im 4/40]
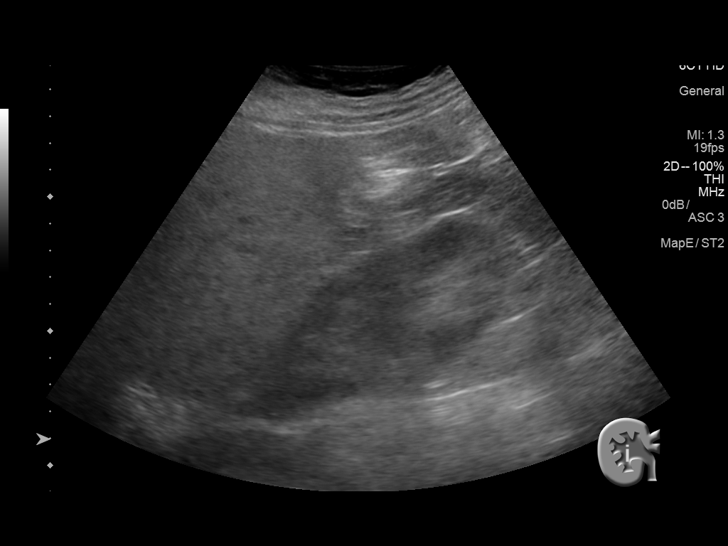
[im 7/40]
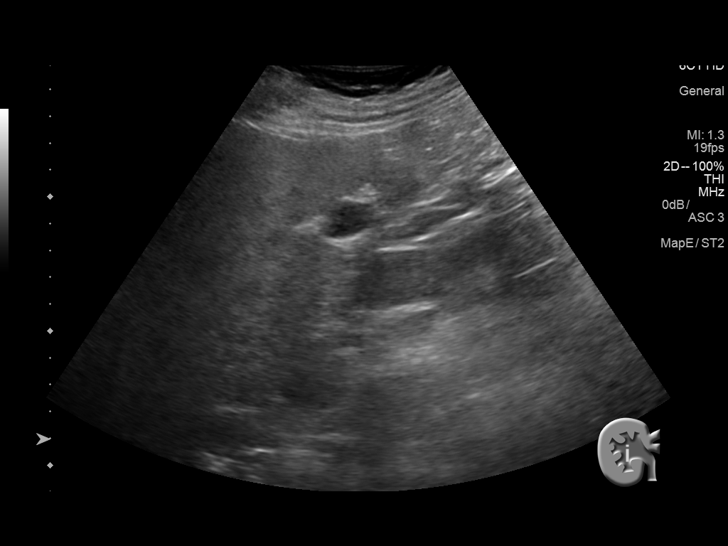
[im 10/40]
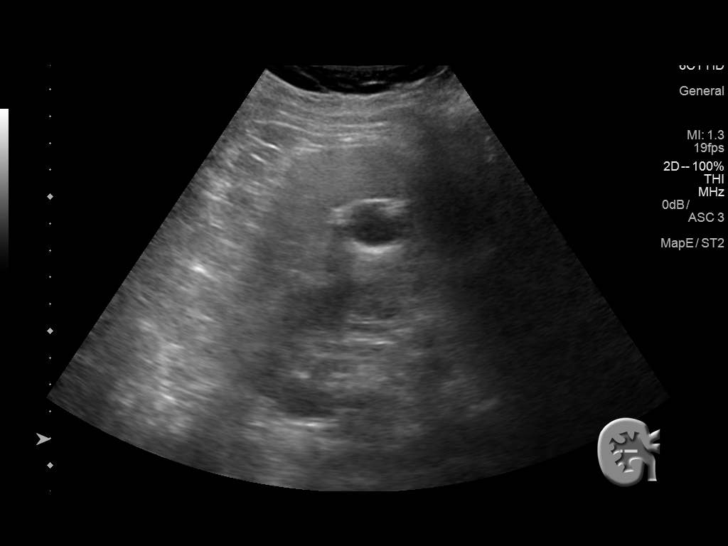
[im 14/40]
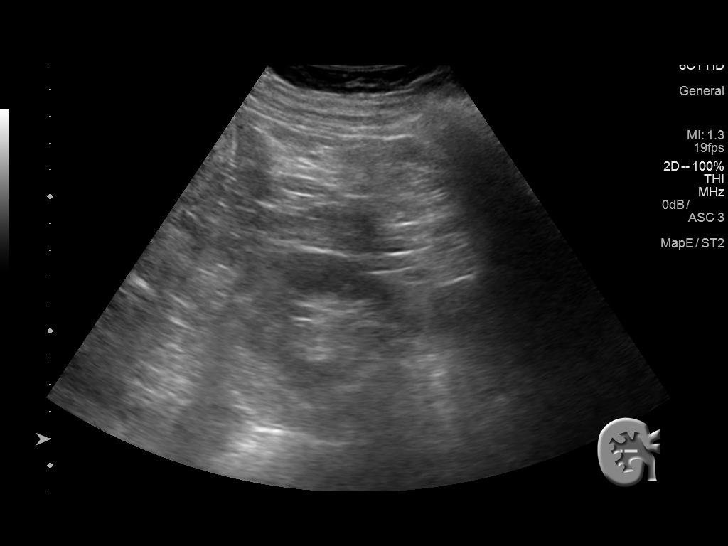
[im 15/40]
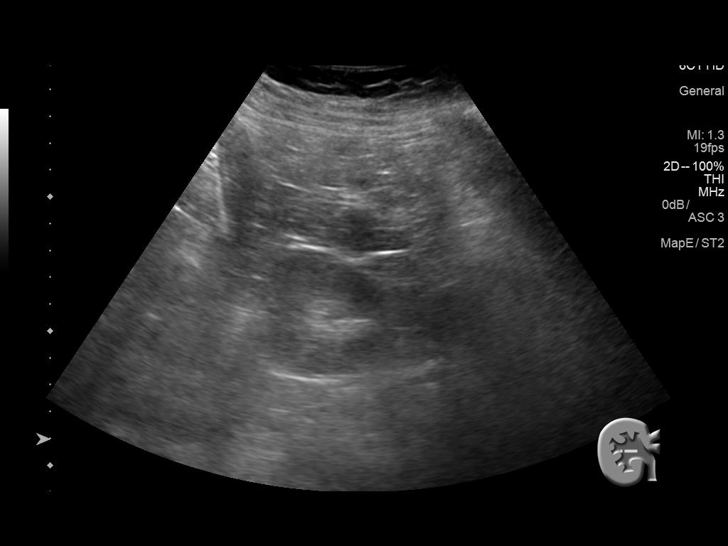
[im 18/40]
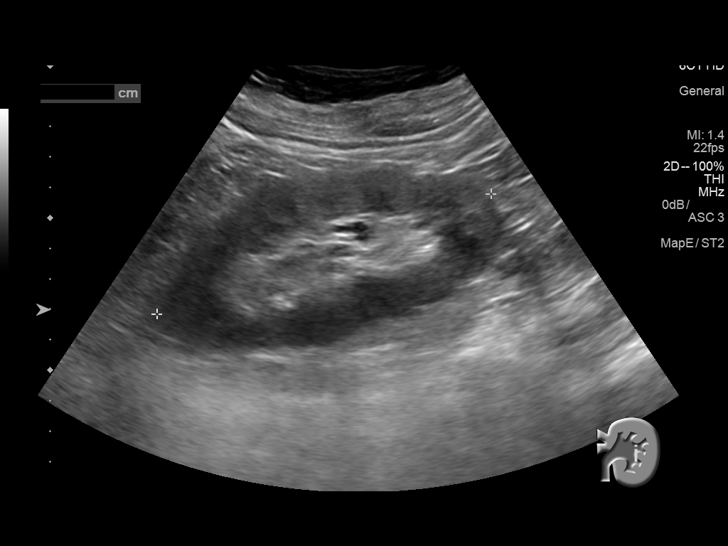
[im 22/40]
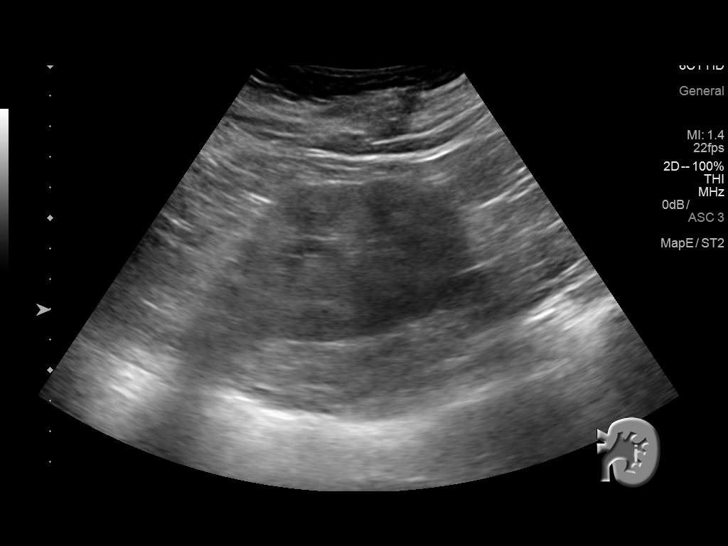
[im 25/40]
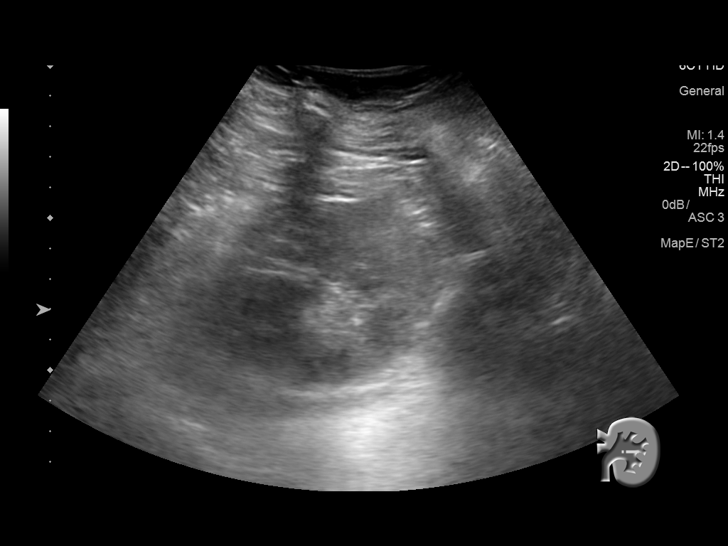
[im 27/40]
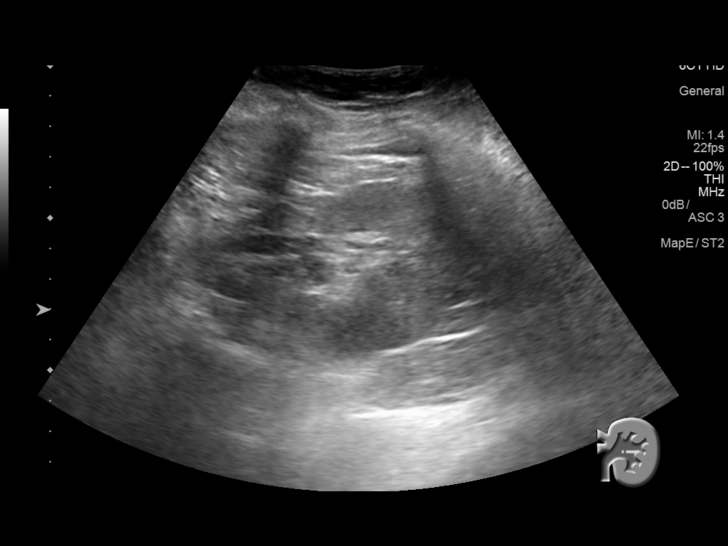
[im 30/40]
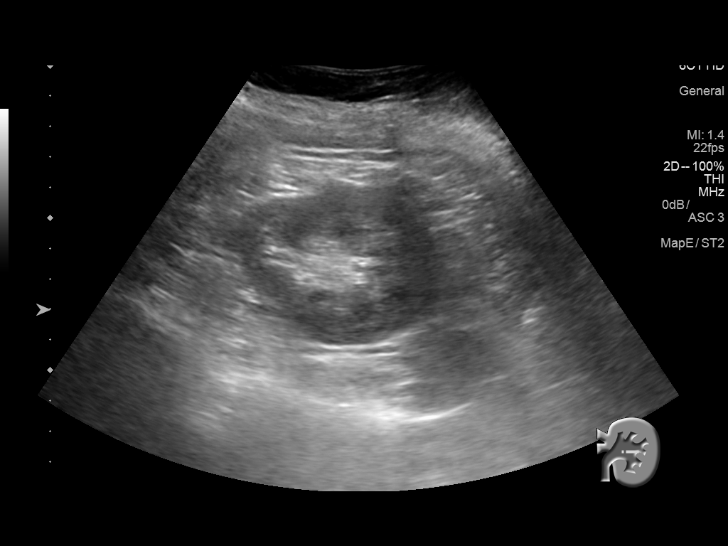
[im 33/40]
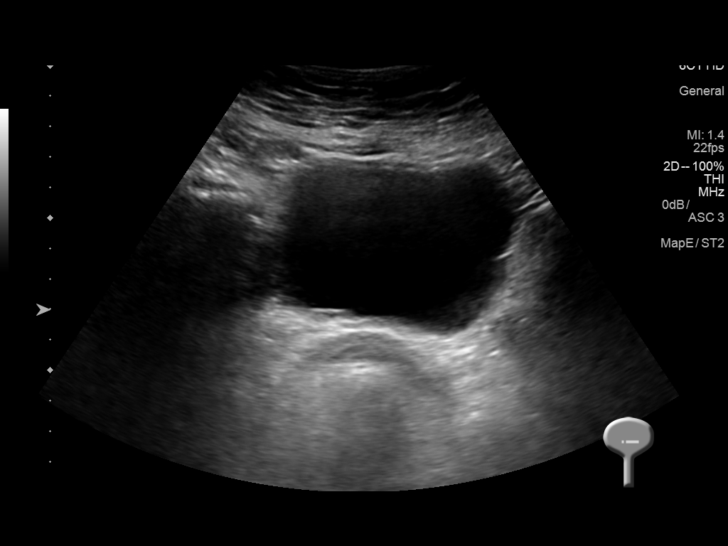
[im 36/40]
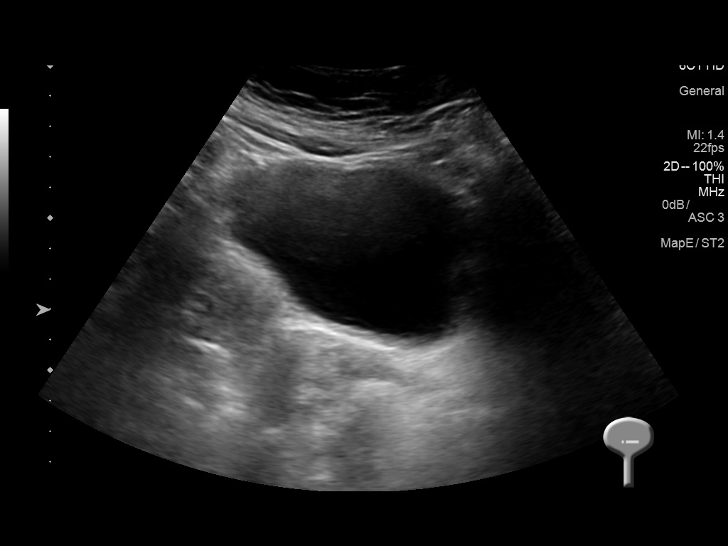
[im 40/40]
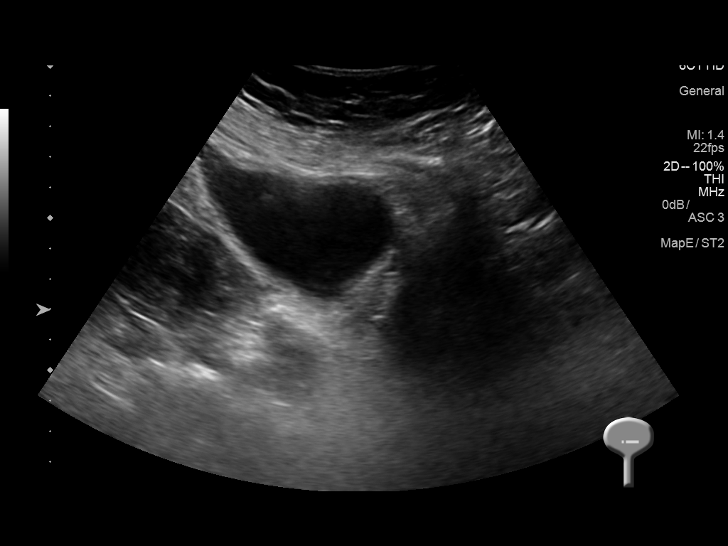

[14 of 25 positions shown; findings below may reference images not displayed]

FINDINGS: Right Kidney:

Length: 12.4 cm. Echogenicity and renal cortical thickness are
within normal limits. No mass, perinephric fluid, or hydronephrosis
visualized. No sonographically demonstrable calculus or
ureterectasis.

Left Kidney:

Length: 11.7 cm. Echogenicity and renal cortical thickness are
within normal limits. No mass, perinephric fluid, or hydronephrosis
visualized. No sonographically demonstrable calculus or
ureterectasis.

Bladder:

Appears normal for degree of bladder distention.
IMPRESSION: Study within normal limits.

## 2018-08-26 DIAGNOSIS — I129 Hypertensive chronic kidney disease with stage 1 through stage 4 chronic kidney disease, or unspecified chronic kidney disease: Secondary | ICD-10-CM | POA: Diagnosis not present

## 2018-08-26 DIAGNOSIS — N183 Chronic kidney disease, stage 3 (moderate): Secondary | ICD-10-CM | POA: Diagnosis not present

## 2018-08-26 DIAGNOSIS — E876 Hypokalemia: Secondary | ICD-10-CM | POA: Diagnosis not present

## 2018-08-26 DIAGNOSIS — E1122 Type 2 diabetes mellitus with diabetic chronic kidney disease: Secondary | ICD-10-CM | POA: Diagnosis not present

## 2018-08-26 DIAGNOSIS — E1140 Type 2 diabetes mellitus with diabetic neuropathy, unspecified: Secondary | ICD-10-CM | POA: Diagnosis not present

## 2018-08-30 DIAGNOSIS — N183 Chronic kidney disease, stage 3 (moderate): Secondary | ICD-10-CM | POA: Diagnosis not present

## 2018-08-30 DIAGNOSIS — I129 Hypertensive chronic kidney disease with stage 1 through stage 4 chronic kidney disease, or unspecified chronic kidney disease: Secondary | ICD-10-CM | POA: Diagnosis not present

## 2018-08-30 DIAGNOSIS — E1122 Type 2 diabetes mellitus with diabetic chronic kidney disease: Secondary | ICD-10-CM | POA: Diagnosis not present

## 2018-08-30 DIAGNOSIS — E1140 Type 2 diabetes mellitus with diabetic neuropathy, unspecified: Secondary | ICD-10-CM | POA: Diagnosis not present

## 2018-08-30 DIAGNOSIS — E876 Hypokalemia: Secondary | ICD-10-CM | POA: Diagnosis not present

## 2018-09-02 DIAGNOSIS — N183 Chronic kidney disease, stage 3 (moderate): Secondary | ICD-10-CM | POA: Diagnosis not present

## 2018-09-02 DIAGNOSIS — E876 Hypokalemia: Secondary | ICD-10-CM | POA: Diagnosis not present

## 2018-09-02 DIAGNOSIS — E1140 Type 2 diabetes mellitus with diabetic neuropathy, unspecified: Secondary | ICD-10-CM | POA: Diagnosis not present

## 2018-09-02 DIAGNOSIS — E1122 Type 2 diabetes mellitus with diabetic chronic kidney disease: Secondary | ICD-10-CM | POA: Diagnosis not present

## 2018-09-02 DIAGNOSIS — I129 Hypertensive chronic kidney disease with stage 1 through stage 4 chronic kidney disease, or unspecified chronic kidney disease: Secondary | ICD-10-CM | POA: Diagnosis not present

## 2018-09-06 DIAGNOSIS — E876 Hypokalemia: Secondary | ICD-10-CM | POA: Diagnosis not present

## 2018-09-06 DIAGNOSIS — E1140 Type 2 diabetes mellitus with diabetic neuropathy, unspecified: Secondary | ICD-10-CM | POA: Diagnosis not present

## 2018-09-06 DIAGNOSIS — N183 Chronic kidney disease, stage 3 (moderate): Secondary | ICD-10-CM | POA: Diagnosis not present

## 2018-09-06 DIAGNOSIS — I129 Hypertensive chronic kidney disease with stage 1 through stage 4 chronic kidney disease, or unspecified chronic kidney disease: Secondary | ICD-10-CM | POA: Diagnosis not present

## 2018-09-06 DIAGNOSIS — E1122 Type 2 diabetes mellitus with diabetic chronic kidney disease: Secondary | ICD-10-CM | POA: Diagnosis not present

## 2018-09-07 DIAGNOSIS — E1122 Type 2 diabetes mellitus with diabetic chronic kidney disease: Secondary | ICD-10-CM | POA: Diagnosis not present

## 2018-09-07 DIAGNOSIS — E1140 Type 2 diabetes mellitus with diabetic neuropathy, unspecified: Secondary | ICD-10-CM | POA: Diagnosis not present

## 2018-09-07 DIAGNOSIS — N183 Chronic kidney disease, stage 3 (moderate): Secondary | ICD-10-CM | POA: Diagnosis not present

## 2018-09-07 DIAGNOSIS — I129 Hypertensive chronic kidney disease with stage 1 through stage 4 chronic kidney disease, or unspecified chronic kidney disease: Secondary | ICD-10-CM | POA: Diagnosis not present

## 2018-09-08 DIAGNOSIS — E1122 Type 2 diabetes mellitus with diabetic chronic kidney disease: Secondary | ICD-10-CM | POA: Diagnosis not present

## 2018-09-08 DIAGNOSIS — N183 Chronic kidney disease, stage 3 (moderate): Secondary | ICD-10-CM | POA: Diagnosis not present

## 2018-09-08 DIAGNOSIS — I129 Hypertensive chronic kidney disease with stage 1 through stage 4 chronic kidney disease, or unspecified chronic kidney disease: Secondary | ICD-10-CM | POA: Diagnosis not present

## 2018-09-08 DIAGNOSIS — E1121 Type 2 diabetes mellitus with diabetic nephropathy: Secondary | ICD-10-CM | POA: Diagnosis not present

## 2018-09-08 DIAGNOSIS — E119 Type 2 diabetes mellitus without complications: Secondary | ICD-10-CM | POA: Diagnosis not present

## 2018-09-09 DIAGNOSIS — I129 Hypertensive chronic kidney disease with stage 1 through stage 4 chronic kidney disease, or unspecified chronic kidney disease: Secondary | ICD-10-CM | POA: Diagnosis not present

## 2018-09-09 DIAGNOSIS — E1122 Type 2 diabetes mellitus with diabetic chronic kidney disease: Secondary | ICD-10-CM | POA: Diagnosis not present

## 2018-09-09 DIAGNOSIS — N183 Chronic kidney disease, stage 3 (moderate): Secondary | ICD-10-CM | POA: Diagnosis not present

## 2018-09-09 DIAGNOSIS — E1140 Type 2 diabetes mellitus with diabetic neuropathy, unspecified: Secondary | ICD-10-CM | POA: Diagnosis not present

## 2018-09-09 DIAGNOSIS — E876 Hypokalemia: Secondary | ICD-10-CM | POA: Diagnosis not present

## 2018-09-12 DIAGNOSIS — E1140 Type 2 diabetes mellitus with diabetic neuropathy, unspecified: Secondary | ICD-10-CM | POA: Diagnosis not present

## 2018-09-12 DIAGNOSIS — N183 Chronic kidney disease, stage 3 (moderate): Secondary | ICD-10-CM | POA: Diagnosis not present

## 2018-09-12 DIAGNOSIS — E1122 Type 2 diabetes mellitus with diabetic chronic kidney disease: Secondary | ICD-10-CM | POA: Diagnosis not present

## 2018-09-12 DIAGNOSIS — I129 Hypertensive chronic kidney disease with stage 1 through stage 4 chronic kidney disease, or unspecified chronic kidney disease: Secondary | ICD-10-CM | POA: Diagnosis not present

## 2018-09-12 DIAGNOSIS — E876 Hypokalemia: Secondary | ICD-10-CM | POA: Diagnosis not present

## 2018-09-13 DIAGNOSIS — E876 Hypokalemia: Secondary | ICD-10-CM | POA: Diagnosis not present

## 2018-09-13 DIAGNOSIS — N183 Chronic kidney disease, stage 3 (moderate): Secondary | ICD-10-CM | POA: Diagnosis not present

## 2018-09-13 DIAGNOSIS — E1140 Type 2 diabetes mellitus with diabetic neuropathy, unspecified: Secondary | ICD-10-CM | POA: Diagnosis not present

## 2018-09-13 DIAGNOSIS — I129 Hypertensive chronic kidney disease with stage 1 through stage 4 chronic kidney disease, or unspecified chronic kidney disease: Secondary | ICD-10-CM | POA: Diagnosis not present

## 2018-09-13 DIAGNOSIS — E1122 Type 2 diabetes mellitus with diabetic chronic kidney disease: Secondary | ICD-10-CM | POA: Diagnosis not present

## 2018-09-15 DIAGNOSIS — I129 Hypertensive chronic kidney disease with stage 1 through stage 4 chronic kidney disease, or unspecified chronic kidney disease: Secondary | ICD-10-CM | POA: Diagnosis not present

## 2018-09-15 DIAGNOSIS — E876 Hypokalemia: Secondary | ICD-10-CM | POA: Diagnosis not present

## 2018-09-15 DIAGNOSIS — E1122 Type 2 diabetes mellitus with diabetic chronic kidney disease: Secondary | ICD-10-CM | POA: Diagnosis not present

## 2018-09-15 DIAGNOSIS — N183 Chronic kidney disease, stage 3 (moderate): Secondary | ICD-10-CM | POA: Diagnosis not present

## 2018-09-15 DIAGNOSIS — E1140 Type 2 diabetes mellitus with diabetic neuropathy, unspecified: Secondary | ICD-10-CM | POA: Diagnosis not present

## 2018-09-27 DIAGNOSIS — E1140 Type 2 diabetes mellitus with diabetic neuropathy, unspecified: Secondary | ICD-10-CM | POA: Diagnosis not present

## 2018-09-27 DIAGNOSIS — E1122 Type 2 diabetes mellitus with diabetic chronic kidney disease: Secondary | ICD-10-CM | POA: Diagnosis not present

## 2018-09-27 DIAGNOSIS — E876 Hypokalemia: Secondary | ICD-10-CM | POA: Diagnosis not present

## 2018-09-27 DIAGNOSIS — I129 Hypertensive chronic kidney disease with stage 1 through stage 4 chronic kidney disease, or unspecified chronic kidney disease: Secondary | ICD-10-CM | POA: Diagnosis not present

## 2018-09-27 DIAGNOSIS — N183 Chronic kidney disease, stage 3 (moderate): Secondary | ICD-10-CM | POA: Diagnosis not present

## 2018-11-15 ENCOUNTER — Other Ambulatory Visit: Payer: Self-pay

## 2018-11-15 NOTE — Patient Outreach (Signed)
[facility] Care Management  11/15/2018  Loretta DibbleCarol A [name] 1946/09/18 [id number]   Medication Adherence call to Mrs. Loretta Merlesarol [name] patient did not answer not sure if this is a correct telephone number for the patient. Mrs. Loretta [name] is showing past due on Atorvastatin 20 mg. Mrs. Loretta [name] is showing past due under [facility]    Loretta AbedAna [name] CPhT Pharmacy Technician [facility] Care Management Direct Dial [phone]  Fax [phone] [name]

## 2018-12-07 DIAGNOSIS — R600 Localized edema: Secondary | ICD-10-CM | POA: Diagnosis not present

## 2018-12-07 DIAGNOSIS — E119 Type 2 diabetes mellitus without complications: Secondary | ICD-10-CM | POA: Diagnosis not present

## 2018-12-07 DIAGNOSIS — N183 Chronic kidney disease, stage 3 (moderate): Secondary | ICD-10-CM | POA: Diagnosis not present

## 2018-12-07 DIAGNOSIS — I129 Hypertensive chronic kidney disease with stage 1 through stage 4 chronic kidney disease, or unspecified chronic kidney disease: Secondary | ICD-10-CM | POA: Diagnosis not present

## 2018-12-07 DIAGNOSIS — Z23 Encounter for immunization: Secondary | ICD-10-CM | POA: Diagnosis not present

## 2018-12-07 DIAGNOSIS — E782 Mixed hyperlipidemia: Secondary | ICD-10-CM | POA: Diagnosis not present

## 2018-12-07 DIAGNOSIS — E1122 Type 2 diabetes mellitus with diabetic chronic kidney disease: Secondary | ICD-10-CM | POA: Diagnosis not present

## 2019-05-29 DIAGNOSIS — Z0000 Encounter for general adult medical examination without abnormal findings: Secondary | ICD-10-CM | POA: Diagnosis not present

## 2019-05-29 DIAGNOSIS — N183 Chronic kidney disease, stage 3 (moderate): Secondary | ICD-10-CM | POA: Diagnosis not present

## 2019-05-29 DIAGNOSIS — E782 Mixed hyperlipidemia: Secondary | ICD-10-CM | POA: Diagnosis not present

## 2019-05-29 DIAGNOSIS — I129 Hypertensive chronic kidney disease with stage 1 through stage 4 chronic kidney disease, or unspecified chronic kidney disease: Secondary | ICD-10-CM | POA: Diagnosis not present

## 2019-05-29 DIAGNOSIS — Z7189 Other specified counseling: Secondary | ICD-10-CM | POA: Diagnosis not present

## 2019-05-29 DIAGNOSIS — E1122 Type 2 diabetes mellitus with diabetic chronic kidney disease: Secondary | ICD-10-CM | POA: Diagnosis not present

## 2019-06-05 DIAGNOSIS — I129 Hypertensive chronic kidney disease with stage 1 through stage 4 chronic kidney disease, or unspecified chronic kidney disease: Secondary | ICD-10-CM | POA: Diagnosis not present

## 2019-06-05 DIAGNOSIS — Z0000 Encounter for general adult medical examination without abnormal findings: Secondary | ICD-10-CM | POA: Diagnosis not present

## 2019-06-05 DIAGNOSIS — E1122 Type 2 diabetes mellitus with diabetic chronic kidney disease: Secondary | ICD-10-CM | POA: Diagnosis not present

## 2019-06-05 DIAGNOSIS — E119 Type 2 diabetes mellitus without complications: Secondary | ICD-10-CM | POA: Diagnosis not present

## 2019-06-05 DIAGNOSIS — N390 Urinary tract infection, site not specified: Secondary | ICD-10-CM | POA: Diagnosis not present

## 2019-06-05 DIAGNOSIS — E1121 Type 2 diabetes mellitus with diabetic nephropathy: Secondary | ICD-10-CM | POA: Diagnosis not present

## 2019-06-13 ENCOUNTER — Other Ambulatory Visit: Payer: Self-pay

## 2019-06-13 NOTE — Patient Outreach (Signed)
[facility]) Care Management  06/13/2019  STAVROULA ROHDE 1946-06-06 [id number]   Medication Adherence call to Mrs. Ethelene Hal patient's telephone number is disconnected patient is showing past due under [place].   [place] Management Direct Dial [phone]  Fax [phone] [email or web address]

## 2019-09-04 DIAGNOSIS — E119 Type 2 diabetes mellitus without complications: Secondary | ICD-10-CM | POA: Diagnosis not present

## 2019-09-04 DIAGNOSIS — E782 Mixed hyperlipidemia: Secondary | ICD-10-CM | POA: Diagnosis not present

## 2019-09-04 DIAGNOSIS — E1122 Type 2 diabetes mellitus with diabetic chronic kidney disease: Secondary | ICD-10-CM | POA: Diagnosis not present

## 2019-09-04 DIAGNOSIS — N183 Chronic kidney disease, stage 3 (moderate): Secondary | ICD-10-CM | POA: Diagnosis not present

## 2019-09-06 DIAGNOSIS — H524 Presbyopia: Secondary | ICD-10-CM | POA: Diagnosis not present

## 2019-09-06 DIAGNOSIS — E119 Type 2 diabetes mellitus without complications: Secondary | ICD-10-CM | POA: Diagnosis not present

## 2019-09-06 DIAGNOSIS — H52223 Regular astigmatism, bilateral: Secondary | ICD-10-CM | POA: Diagnosis not present

## 2019-09-06 DIAGNOSIS — H5213 Myopia, bilateral: Secondary | ICD-10-CM | POA: Diagnosis not present

## 2019-09-11 DIAGNOSIS — E782 Mixed hyperlipidemia: Secondary | ICD-10-CM | POA: Diagnosis not present

## 2019-09-11 DIAGNOSIS — N183 Chronic kidney disease, stage 3 (moderate): Secondary | ICD-10-CM | POA: Diagnosis not present

## 2019-09-11 DIAGNOSIS — Z23 Encounter for immunization: Secondary | ICD-10-CM | POA: Diagnosis not present

## 2019-09-11 DIAGNOSIS — E1121 Type 2 diabetes mellitus with diabetic nephropathy: Secondary | ICD-10-CM | POA: Diagnosis not present

## 2019-09-11 DIAGNOSIS — I129 Hypertensive chronic kidney disease with stage 1 through stage 4 chronic kidney disease, or unspecified chronic kidney disease: Secondary | ICD-10-CM | POA: Diagnosis not present

## 2019-09-11 DIAGNOSIS — E1122 Type 2 diabetes mellitus with diabetic chronic kidney disease: Secondary | ICD-10-CM | POA: Diagnosis not present

## 2019-09-19 ENCOUNTER — Emergency Department (HOSPITAL_COMMUNITY): Payer: Medicare Other

## 2019-09-19 ENCOUNTER — Other Ambulatory Visit: Payer: Self-pay

## 2019-09-19 ENCOUNTER — Inpatient Hospital Stay (HOSPITAL_COMMUNITY)
Admission: EM | Admit: 2019-09-19 | Discharge: 2019-09-27 | DRG: 853 | Disposition: A | Discharge status: Rehabilitation Facility | Payer: Medicare Other | Source: Skilled Nursing Facility | Attending: Internal Medicine | Admitting: Internal Medicine

## 2019-09-19 DIAGNOSIS — L089 Local infection of the skin and subcutaneous tissue, unspecified: Secondary | ICD-10-CM | POA: Diagnosis not present

## 2019-09-19 DIAGNOSIS — Z833 Family history of diabetes mellitus: Secondary | ICD-10-CM

## 2019-09-19 DIAGNOSIS — R0902 Hypoxemia: Secondary | ICD-10-CM | POA: Diagnosis not present

## 2019-09-19 DIAGNOSIS — A419 Sepsis, unspecified organism: Principal | ICD-10-CM | POA: Diagnosis present

## 2019-09-19 DIAGNOSIS — W19XXXA Unspecified fall, initial encounter: Secondary | ICD-10-CM | POA: Diagnosis present

## 2019-09-19 DIAGNOSIS — L97919 Non-pressure chronic ulcer of unspecified part of right lower leg with unspecified severity: Secondary | ICD-10-CM | POA: Diagnosis not present

## 2019-09-19 DIAGNOSIS — Z79899 Other long term (current) drug therapy: Secondary | ICD-10-CM

## 2019-09-19 DIAGNOSIS — E785 Hyperlipidemia, unspecified: Secondary | ICD-10-CM | POA: Diagnosis present

## 2019-09-19 DIAGNOSIS — E860 Dehydration: Secondary | ICD-10-CM | POA: Diagnosis not present

## 2019-09-19 DIAGNOSIS — Z8249 Family history of ischemic heart disease and other diseases of the circulatory system: Secondary | ICD-10-CM | POA: Diagnosis not present

## 2019-09-19 DIAGNOSIS — E11621 Type 2 diabetes mellitus with foot ulcer: Secondary | ICD-10-CM | POA: Diagnosis present

## 2019-09-19 DIAGNOSIS — N390 Urinary tract infection, site not specified: Secondary | ICD-10-CM | POA: Diagnosis present

## 2019-09-19 DIAGNOSIS — L97529 Non-pressure chronic ulcer of other part of left foot with unspecified severity: Secondary | ICD-10-CM | POA: Diagnosis present

## 2019-09-19 DIAGNOSIS — I10 Essential (primary) hypertension: Secondary | ICD-10-CM | POA: Diagnosis present

## 2019-09-19 DIAGNOSIS — M79671 Pain in right foot: Secondary | ICD-10-CM | POA: Diagnosis not present

## 2019-09-19 DIAGNOSIS — R600 Localized edema: Secondary | ICD-10-CM | POA: Diagnosis not present

## 2019-09-19 DIAGNOSIS — S99921A Unspecified injury of right foot, initial encounter: Secondary | ICD-10-CM | POA: Diagnosis not present

## 2019-09-19 DIAGNOSIS — N179 Acute kidney failure, unspecified: Secondary | ICD-10-CM | POA: Diagnosis not present

## 2019-09-19 DIAGNOSIS — S98139D Complete traumatic amputation of one unspecified lesser toe, subsequent encounter: Secondary | ICD-10-CM | POA: Diagnosis not present

## 2019-09-19 DIAGNOSIS — E871 Hypo-osmolality and hyponatremia: Secondary | ICD-10-CM | POA: Diagnosis not present

## 2019-09-19 DIAGNOSIS — Z7982 Long term (current) use of aspirin: Secondary | ICD-10-CM

## 2019-09-19 DIAGNOSIS — E876 Hypokalemia: Secondary | ICD-10-CM | POA: Diagnosis not present

## 2019-09-19 DIAGNOSIS — Z89422 Acquired absence of other left toe(s): Secondary | ICD-10-CM | POA: Diagnosis not present

## 2019-09-19 DIAGNOSIS — Z20828 Contact with and (suspected) exposure to other viral communicable diseases: Secondary | ICD-10-CM | POA: Diagnosis present

## 2019-09-19 DIAGNOSIS — G9341 Metabolic encephalopathy: Secondary | ICD-10-CM | POA: Diagnosis present

## 2019-09-19 DIAGNOSIS — M79651 Pain in right thigh: Secondary | ICD-10-CM | POA: Diagnosis not present

## 2019-09-19 DIAGNOSIS — L8990 Pressure ulcer of unspecified site, unspecified stage: Secondary | ICD-10-CM | POA: Insufficient documentation

## 2019-09-19 DIAGNOSIS — E1165 Type 2 diabetes mellitus with hyperglycemia: Secondary | ICD-10-CM | POA: Diagnosis not present

## 2019-09-19 DIAGNOSIS — M79652 Pain in left thigh: Secondary | ICD-10-CM | POA: Diagnosis not present

## 2019-09-19 DIAGNOSIS — R079 Chest pain, unspecified: Secondary | ICD-10-CM | POA: Diagnosis not present

## 2019-09-19 DIAGNOSIS — R5381 Other malaise: Secondary | ICD-10-CM | POA: Diagnosis not present

## 2019-09-19 DIAGNOSIS — L0390 Cellulitis, unspecified: Secondary | ICD-10-CM | POA: Diagnosis present

## 2019-09-19 DIAGNOSIS — M79672 Pain in left foot: Secondary | ICD-10-CM | POA: Diagnosis not present

## 2019-09-19 DIAGNOSIS — Z9181 History of falling: Secondary | ICD-10-CM | POA: Diagnosis not present

## 2019-09-19 DIAGNOSIS — Z6841 Body Mass Index (BMI) 40.0 and over, adult: Secondary | ICD-10-CM | POA: Diagnosis not present

## 2019-09-19 DIAGNOSIS — M6250 Muscle wasting and atrophy, not elsewhere classified, unspecified site: Secondary | ICD-10-CM | POA: Diagnosis not present

## 2019-09-19 DIAGNOSIS — E1152 Type 2 diabetes mellitus with diabetic peripheral angiopathy with gangrene: Secondary | ICD-10-CM | POA: Diagnosis present

## 2019-09-19 DIAGNOSIS — R531 Weakness: Secondary | ICD-10-CM | POA: Diagnosis not present

## 2019-09-19 DIAGNOSIS — M6282 Rhabdomyolysis: Secondary | ICD-10-CM | POA: Diagnosis not present

## 2019-09-19 DIAGNOSIS — L03119 Cellulitis of unspecified part of limb: Secondary | ICD-10-CM

## 2019-09-19 DIAGNOSIS — Z23 Encounter for immunization: Secondary | ICD-10-CM | POA: Diagnosis not present

## 2019-09-19 DIAGNOSIS — R404 Transient alteration of awareness: Secondary | ICD-10-CM | POA: Diagnosis not present

## 2019-09-19 DIAGNOSIS — R52 Pain, unspecified: Secondary | ICD-10-CM | POA: Diagnosis not present

## 2019-09-19 DIAGNOSIS — S79922A Unspecified injury of left thigh, initial encounter: Secondary | ICD-10-CM | POA: Diagnosis not present

## 2019-09-19 DIAGNOSIS — R410 Disorientation, unspecified: Secondary | ICD-10-CM | POA: Diagnosis not present

## 2019-09-19 DIAGNOSIS — S3993XA Unspecified injury of pelvis, initial encounter: Secondary | ICD-10-CM | POA: Diagnosis not present

## 2019-09-19 DIAGNOSIS — M7989 Other specified soft tissue disorders: Secondary | ICD-10-CM | POA: Diagnosis not present

## 2019-09-19 DIAGNOSIS — M79662 Pain in left lower leg: Secondary | ICD-10-CM | POA: Diagnosis not present

## 2019-09-19 DIAGNOSIS — L89890 Pressure ulcer of other site, unstageable: Secondary | ICD-10-CM | POA: Diagnosis not present

## 2019-09-19 DIAGNOSIS — E870 Hyperosmolality and hypernatremia: Secondary | ICD-10-CM | POA: Diagnosis not present

## 2019-09-19 DIAGNOSIS — M79641 Pain in right hand: Secondary | ICD-10-CM | POA: Diagnosis not present

## 2019-09-19 DIAGNOSIS — I96 Gangrene, not elsewhere classified: Secondary | ICD-10-CM | POA: Diagnosis not present

## 2019-09-19 DIAGNOSIS — I129 Hypertensive chronic kidney disease with stage 1 through stage 4 chronic kidney disease, or unspecified chronic kidney disease: Secondary | ICD-10-CM | POA: Diagnosis not present

## 2019-09-19 DIAGNOSIS — R4020 Unspecified coma: Secondary | ICD-10-CM | POA: Diagnosis not present

## 2019-09-19 DIAGNOSIS — E119 Type 2 diabetes mellitus without complications: Secondary | ICD-10-CM

## 2019-09-19 DIAGNOSIS — S6991XA Unspecified injury of right wrist, hand and finger(s), initial encounter: Secondary | ICD-10-CM | POA: Diagnosis not present

## 2019-09-19 DIAGNOSIS — E7800 Pure hypercholesterolemia, unspecified: Secondary | ICD-10-CM | POA: Diagnosis present

## 2019-09-19 DIAGNOSIS — Z888 Allergy status to other drugs, medicaments and biological substances status: Secondary | ICD-10-CM | POA: Diagnosis not present

## 2019-09-19 DIAGNOSIS — R51 Headache: Secondary | ICD-10-CM | POA: Diagnosis not present

## 2019-09-19 DIAGNOSIS — Z4789 Encounter for other orthopedic aftercare: Secondary | ICD-10-CM | POA: Diagnosis not present

## 2019-09-19 DIAGNOSIS — L97524 Non-pressure chronic ulcer of other part of left foot with necrosis of bone: Secondary | ICD-10-CM | POA: Diagnosis not present

## 2019-09-19 LAB — BASIC METABOLIC PANEL
Anion gap: 9 (ref 5–15)
BUN: 90 mg/dL — ABNORMAL HIGH (ref 8–23)
CO2: 23 mmol/L (ref 22–32)
Calcium: 7.8 mg/dL — ABNORMAL LOW (ref 8.9–10.3)
Chloride: 123 mmol/L — ABNORMAL HIGH (ref 98–111)
Creatinine, Ser: 1.52 mg/dL — ABNORMAL HIGH (ref 0.44–1.00)
GFR calc Af Amer: 40 mL/min — ABNORMAL LOW (ref >60)
GFR calc non Af Amer: 34 mL/min — ABNORMAL LOW (ref >60)
Glucose, Bld: 194 mg/dL — ABNORMAL HIGH (ref 70–99)
Potassium: 2.7 mmol/L — CL (ref 3.5–5.1)
Sodium: 155 mmol/L — ABNORMAL HIGH (ref 135–145)

## 2019-09-19 LAB — HEMOGLOBIN A1C
Hgb A1c MFr Bld: 6.7 % — ABNORMAL HIGH (ref 4.8–5.6)
Mean Plasma Glucose: 145.59 mg/dL

## 2019-09-19 LAB — CBC WITH DIFFERENTIAL/PLATELET
Abs Immature Granulocytes: 0.08 10*3/uL — ABNORMAL HIGH (ref 0.00–0.07)
Basophils Absolute: 0 10*3/uL (ref 0.0–0.1)
Basophils Relative: 0 %
Eosinophils Absolute: 0 10*3/uL (ref 0.0–0.5)
Eosinophils Relative: 0 %
HCT: 58.8 % — ABNORMAL HIGH (ref 36.0–46.0)
Hemoglobin: 18.6 g/dL — ABNORMAL HIGH (ref 12.0–15.0)
Immature Granulocytes: 1 %
Lymphocytes Relative: 7 %
Lymphs Abs: 1 10*3/uL (ref 0.7–4.0)
MCH: 28.8 pg (ref 26.0–34.0)
MCHC: 31.6 g/dL (ref 30.0–36.0)
MCV: 91 fL (ref 80.0–100.0)
Monocytes Absolute: 1 10*3/uL (ref 0.1–1.0)
Monocytes Relative: 7 %
Neutro Abs: 13.4 10*3/uL — ABNORMAL HIGH (ref 1.7–7.7)
Neutrophils Relative %: 85 %
Platelets: 186 10*3/uL (ref 150–400)
RBC: 6.46 MIL/uL — ABNORMAL HIGH (ref 3.87–5.11)
RDW: 14.8 % (ref 11.5–15.5)
WBC: 15.5 10*3/uL — ABNORMAL HIGH (ref 4.0–10.5)
nRBC: 0 % (ref 0.0–0.2)

## 2019-09-19 LAB — COMPREHENSIVE METABOLIC PANEL
ALT: 36 U/L (ref 0–44)
AST: 61 U/L — ABNORMAL HIGH (ref 15–41)
Albumin: 3.5 g/dL (ref 3.5–5.0)
Alkaline Phosphatase: 74 U/L (ref 38–126)
Anion gap: 19 — ABNORMAL HIGH (ref 5–15)
BUN: 116 mg/dL — ABNORMAL HIGH (ref 8–23)
CO2: 21 mmol/L — ABNORMAL LOW (ref 22–32)
Calcium: 9.5 mg/dL (ref 8.9–10.3)
Chloride: 114 mmol/L — ABNORMAL HIGH (ref 98–111)
Creatinine, Ser: 2.3 mg/dL — ABNORMAL HIGH (ref 0.44–1.00)
GFR calc Af Amer: 24 mL/min — ABNORMAL LOW (ref >60)
GFR calc non Af Amer: 21 mL/min — ABNORMAL LOW (ref >60)
Glucose, Bld: 264 mg/dL — ABNORMAL HIGH (ref 70–99)
Potassium: 4 mmol/L (ref 3.5–5.1)
Sodium: 154 mmol/L — ABNORMAL HIGH (ref 135–145)
Total Bilirubin: 1.4 mg/dL — ABNORMAL HIGH (ref 0.3–1.2)
Total Protein: 7.6 g/dL (ref 6.5–8.1)

## 2019-09-19 LAB — URINALYSIS, ROUTINE W REFLEX MICROSCOPIC
Bilirubin Urine: NEGATIVE
Glucose, UA: 150 mg/dL — AB
Ketones, ur: NEGATIVE mg/dL
Nitrite: NEGATIVE
Protein, ur: 100 mg/dL — AB
Specific Gravity, Urine: 1.018 (ref 1.005–1.030)
pH: 5 (ref 5.0–8.0)

## 2019-09-19 LAB — LACTIC ACID, PLASMA
Lactic Acid, Venous: 3.3 mmol/L (ref 0.5–1.9)
Lactic Acid, Venous: 1.7 mmol/L (ref 0.5–1.9)

## 2019-09-19 LAB — CBG MONITORING, ED
Glucose-Capillary: 252 mg/dL — ABNORMAL HIGH (ref 70–99)
Glucose-Capillary: 188 mg/dL — ABNORMAL HIGH (ref 70–99)
Glucose-Capillary: 212 mg/dL — ABNORMAL HIGH (ref 70–99)

## 2019-09-19 LAB — TROPONIN I (HIGH SENSITIVITY)
Troponin I (High Sensitivity): 26 ng/L — ABNORMAL HIGH (ref <18)
Troponin I (High Sensitivity): 29 ng/L — ABNORMAL HIGH (ref <18)

## 2019-09-19 LAB — SARS CORONAVIRUS 2 BY RT PCR (HOSPITAL ORDER, PERFORMED IN ~~LOC~~ HOSPITAL LAB): SARS Coronavirus 2: NEGATIVE

## 2019-09-19 LAB — CK: Total CK: 1495 U/L — ABNORMAL HIGH (ref 38–234)

## 2019-09-19 MED ORDER — NYSTATIN 100000 UNIT/GM EX CREA
TOPICAL_CREAM | Freq: Two times a day (BID) | CUTANEOUS | Status: DC
  Administered 2019-09-20 – 2019-09-27 (×15): via TOPICAL
  Filled 2019-09-19 (×4): qty 15

## 2019-09-19 MED ORDER — SODIUM CHLORIDE 0.9 % IV SOLN: 2.0000 g | Freq: Two times a day (BID) | INTRAVENOUS | Status: DC

## 2019-09-19 MED ORDER — INSULIN ASPART 100 UNIT/ML ~~LOC~~ SOLN
0.0000 [IU] | Freq: Every day | SUBCUTANEOUS | Status: DC
  Administered 2019-09-19 – 2019-09-21 (×3): 2 [IU] via SUBCUTANEOUS
  Administered 2019-09-22: 4 [IU] via SUBCUTANEOUS

## 2019-09-19 MED ORDER — SODIUM CHLORIDE 0.9 % IV SOLN
2.0000 g | Freq: Once | INTRAVENOUS | Status: AC
  Administered 2019-09-19: 2 g via INTRAVENOUS
  Filled 2019-09-19: qty 2

## 2019-09-19 MED ORDER — INSULIN ASPART 100 UNIT/ML ~~LOC~~ SOLN
0.0000 [IU] | Freq: Three times a day (TID) | SUBCUTANEOUS | Status: DC
  Administered 2019-09-19: 18:00:00 3 [IU] via SUBCUTANEOUS

## 2019-09-19 MED ORDER — INSULIN ASPART 100 UNIT/ML ~~LOC~~ SOLN
0.0000 [IU] | Freq: Four times a day (QID) | SUBCUTANEOUS | Status: DC
  Administered 2019-09-20: 12:00:00 3 [IU] via SUBCUTANEOUS
  Administered 2019-09-20: 8 [IU] via SUBCUTANEOUS
  Administered 2019-09-20 – 2019-09-21 (×4): 5 [IU] via SUBCUTANEOUS
  Administered 2019-09-21 – 2019-09-22 (×2): 3 [IU] via SUBCUTANEOUS
  Administered 2019-09-22: 5 [IU] via SUBCUTANEOUS
  Administered 2019-09-22 – 2019-09-23 (×4): 3 [IU] via SUBCUTANEOUS
  Administered 2019-09-23: 5 [IU] via SUBCUTANEOUS
  Administered 2019-09-24 – 2019-09-25 (×9): 3 [IU] via SUBCUTANEOUS
  Administered 2019-09-26: 2 [IU] via SUBCUTANEOUS
  Administered 2019-09-26 – 2019-09-27 (×4): 3 [IU] via SUBCUTANEOUS

## 2019-09-19 MED ORDER — ONDANSETRON HCL 4 MG PO TABS: 4.0000 mg | ORAL_TABLET | Freq: Four times a day (QID) | ORAL | Status: DC | PRN

## 2019-09-19 MED ORDER — SODIUM CHLORIDE 0.9 % IV BOLUS
1000.0000 mL | Freq: Once | INTRAVENOUS | Status: AC
  Administered 2019-09-19: 1000 mL via INTRAVENOUS

## 2019-09-19 MED ORDER — SODIUM CHLORIDE 0.45 % IV SOLN
INTRAVENOUS | Status: DC
  Administered 2019-09-19: 100 mL/h via INTRAVENOUS

## 2019-09-19 MED ORDER — DEXTROSE-NACL 5-0.45 % IV SOLN: INTRAVENOUS | Status: DC

## 2019-09-19 MED ORDER — NEBIVOLOL HCL 10 MG PO TABS
10.0000 mg | ORAL_TABLET | Freq: Every day | ORAL | Status: DC
  Administered 2019-09-20 – 2019-09-27 (×8): 10 mg via ORAL
  Filled 2019-09-19 (×8): qty 1

## 2019-09-19 MED ORDER — POTASSIUM CHLORIDE 10 MEQ/100ML IV SOLN
10.0000 meq | Freq: Once | INTRAVENOUS | Status: AC
  Administered 2019-09-19: 22:00:00 10 meq via INTRAVENOUS
  Filled 2019-09-19: qty 100

## 2019-09-19 MED ORDER — HEPARIN SODIUM (PORCINE) 5000 UNIT/ML IJ SOLN
5000.0000 [IU] | Freq: Three times a day (TID) | INTRAMUSCULAR | Status: DC
  Administered 2019-09-19 – 2019-09-27 (×21): 5000 [IU] via SUBCUTANEOUS
  Filled 2019-09-19 (×19): qty 1

## 2019-09-19 MED ORDER — TETANUS-DIPHTH-ACELL PERTUSSIS 5-2.5-18.5 LF-MCG/0.5 IM SUSP
0.5000 mL | Freq: Once | INTRAMUSCULAR | Status: AC
  Administered 2019-09-19: 0.5 mL via INTRAMUSCULAR
  Filled 2019-09-19: qty 0.5

## 2019-09-19 MED ORDER — SODIUM CHLORIDE 0.9 % IV SOLN
1.0000 g | INTRAVENOUS | Status: DC
  Administered 2019-09-20 – 2019-09-22 (×3): 1 g via INTRAVENOUS
  Filled 2019-09-19: qty 1
  Filled 2019-09-19: qty 10
  Filled 2019-09-19 (×2): qty 1

## 2019-09-19 MED ORDER — ASPIRIN EC 81 MG PO TBEC
81.0000 mg | DELAYED_RELEASE_TABLET | Freq: Every day | ORAL | Status: DC
  Administered 2019-09-20 – 2019-09-27 (×8): 81 mg via ORAL
  Filled 2019-09-19 (×8): qty 1

## 2019-09-19 MED ORDER — ACETAMINOPHEN 325 MG PO TABS
650.0000 mg | ORAL_TABLET | Freq: Four times a day (QID) | ORAL | Status: DC | PRN
  Administered 2019-09-24 – 2019-09-25 (×2): 650 mg via ORAL
  Filled 2019-09-19 (×2): qty 2

## 2019-09-19 MED ORDER — VANCOMYCIN HCL 10 G IV SOLR: 1500.0000 mg | INTRAVENOUS | Status: DC

## 2019-09-19 MED ORDER — HEPARIN SODIUM (PORCINE) 5000 UNIT/ML IJ SOLN
5000.0000 [IU] | Freq: Three times a day (TID) | INTRAMUSCULAR | Status: DC
  Filled 2019-09-19: qty 1

## 2019-09-19 MED ORDER — METRONIDAZOLE IN NACL 5-0.79 MG/ML-% IV SOLN
500.0000 mg | Freq: Once | INTRAVENOUS | Status: AC
  Administered 2019-09-19: 500 mg via INTRAVENOUS
  Filled 2019-09-19: qty 100

## 2019-09-19 MED ORDER — DEXTROSE 5 % IV SOLN
INTRAVENOUS | Status: DC
  Administered 2019-09-19: 125 mL/h via INTRAVENOUS
  Administered 2019-09-20 – 2019-09-25 (×8): via INTRAVENOUS

## 2019-09-19 MED ORDER — VANCOMYCIN HCL 10 G IV SOLR
2000.0000 mg | Freq: Once | INTRAVENOUS | Status: AC
  Administered 2019-09-19: 13:00:00 2000 mg via INTRAVENOUS
  Filled 2019-09-19: qty 2000

## 2019-09-19 MED ORDER — SODIUM CHLORIDE 0.9% FLUSH
3.0000 mL | Freq: Two times a day (BID) | INTRAVENOUS | Status: DC
  Administered 2019-09-19 – 2019-09-26 (×6): 3 mL via INTRAVENOUS

## 2019-09-19 MED ORDER — ONDANSETRON HCL 4 MG/2ML IJ SOLN: 4.0000 mg | Freq: Four times a day (QID) | INTRAMUSCULAR | Status: DC | PRN

## 2019-09-19 MED ORDER — AMLODIPINE BESYLATE 5 MG PO TABS
5.0000 mg | ORAL_TABLET | Freq: Every day | ORAL | Status: DC
  Administered 2019-09-20 – 2019-09-27 (×8): 5 mg via ORAL
  Filled 2019-09-19 (×8): qty 1

## 2019-09-19 MED ORDER — ALBUTEROL SULFATE (2.5 MG/3ML) 0.083% IN NEBU: 2.5000 mg | INHALATION_SOLUTION | Freq: Four times a day (QID) | RESPIRATORY_TRACT | Status: DC | PRN

## 2019-09-19 MED ORDER — MORPHINE SULFATE (PF) 4 MG/ML IV SOLN
4.0000 mg | Freq: Once | INTRAVENOUS | Status: AC
  Administered 2019-09-19: 4 mg via INTRAVENOUS
  Filled 2019-09-19: qty 1

## 2019-09-19 MED ORDER — ACETAMINOPHEN 650 MG RE SUPP: 650.0000 mg | Freq: Four times a day (QID) | RECTAL | Status: DC | PRN

## 2019-09-19 NOTE — H&P (Signed)
History and Physical    Loretta [name] SNK:[id number] DOB: 1946-02-07 DOA: 09/19/2019  Referring MD/NP/PA: Coral Ceo, PA-C PCP: Anda Kraft, MD  Patient coming from: [facility] dependent living facility via EMS  Chief Complaint: Found down  I have personally briefly reviewed patient's old medical records in [place]   HPI: Loretta [name] is a 73 y.o. female with medical history significant of hypertension, hyperlipidemia, and diabetes mellitus type 2; who presents after being found down down with altered mental status.  History is limited as patient is currently altered she has been living in independent living and the patient son had last communicated with her by email on 9/24.  Normally they will talk once or twice a week over the phone.  However, when the patient did not pick up the phone recently he called the facility to do a wellness check today.  They found the patient on the floor covered in stool and urine. Unclear if she fell or slide out of bed. Son reports that his mother is on the spectrum and likes to be isolated.  She has fired prior physical therapist and home health aids.  She does complain of pain in her legs back and neck.  Son would like the patient to go to some kind of rehab or skilled nursing facility at discharge.  ED Course: Upon admission into the emergency department patient was seen to be afebrile, blood pressure elevated up to 170/144, but no other vital signs maintained.  Labs significant for WBC 15.5, hemoglobin 18.6, sodium 154, chloride 114, CO2 21, BUN 116, creatinine 2.3, glucose 264, anion gap 19,  AST 61, CK 1495, troponin 26, and lactic acid 3.3.  Chest x-ray was clear of any acute abnormalities.  Urinalysis was concerning for infection blood and urine cultures were obtained.  Patient was started on empiric antibiotics of vancomycin, metronidazole, and cefepime.  Review of Systems  Unable to perform ROS: Mental status change  Musculoskeletal:  Positive for myalgias and neck pain.    Past Medical History:  Diagnosis Date   Diabetes mellitus without complication ([place])    Type   Hyperlipidemia    Hypertension     Past Surgical History:  Procedure Laterality Date   ORIF ANKLE FRACTURE Right 01/04/2017   Procedure: OPEN REDUCTION INTERNAL FIXATION (ORIF) RIGHT ANKLE FRACTURE;  Surgeon: Marybelle Killings, MD;  Location: [facility];  Service: Orthopedics;  Laterality: Right;     reports that she has never smoked. She has never used smokeless tobacco. She reports that she does not drink alcohol or use drugs.  Allergies  Allergen Reactions   Pravastatin Other (See Comments)    Caused leg paralysis     Family History  Problem Relation Age of Onset   Congestive Heart Failure Mother    Diabetes Mellitus II Father    Osteoporosis Neg Hx     Prior to Admission medications   Medication Sig Start Date End Date Taking? Authorizing Provider  amLODipine (NORVASC) 5 MG tablet Take 5 mg by mouth daily.  09/27/16  Yes [provider]  aspirin EC 325 MG tablet Take 1 tablet (325 mg total) by mouth daily. Patient taking differently: Take 81 mg by mouth daily.  01/05/17  Yes Lanae Crumbly, PA-C  atorvastatin (LIPITOR) 20 MG tablet Take 20 mg by mouth at bedtime.  10/19/16  Yes [provider]  Dapagliflozin-Metformin HCl ER (XIGDUO XR) 09-999 MG TB24 Take 1 tablet by mouth daily.   Yes [provider]  furosemide (LASIX) 40 MG tablet Take 1 tablet (40 mg total) by mouth daily. 09/18/17 09/19/19 Yes Jerald Kief, MD  ketoconazole (NIZORAL) 2 % cream Apply 1 application topically daily as needed (scalp irritation).  09/06/17  Yes [provider]  KLOR-CON M20 20 MEQ tablet Take 20 mEq by mouth daily. 09/08/17  Yes [provider]  Multiple Vitamin (MULTIVITAMIN WITH MINERALS) TABS tablet Take 1 tablet by mouth daily.   Yes [provider]  nebivolol (BYSTOLIC) 10 MG tablet Take 10 mg by  mouth daily.   Yes [provider]  olmesartan (BENICAR) 20 MG tablet Take 20 mg by mouth daily.   Yes [provider]  Vitamin D, Ergocalciferol, 2000 units CAPS Take 1 capsule by mouth daily.   Yes [provider]  cefpodoxime (VANTIN) 200 MG tablet Take 1 tablet (200 mg total) by mouth 2 (two) times daily. Patient not taking: Reported on 09/19/2019 09/18/17   Jerald Kief, MD    Physical Exam:  Constitutional: Obese female who appears to be altered but in no acute distress Vitals:   09/19/19 0808 09/19/19 0817 09/19/19 0826 09/19/19 1014  BP:    (!) 170/144  Pulse:  92  71  Resp:  16  18  Temp:  97.8 F (36.6 C) 98 F (36.7 C)   TempSrc:  Oral Rectal   SpO2: 93% 100%  98%  Weight:    127 kg  Height:     (1.727 m)   Eyes: PERRL, lids and conjunctivae normal ENMT: Mucous membranes are dry. Posterior pharynx clear of any exudate or lesions. Neck: normal, supple, no masses, no thyromegaly Respiratory: clear to auscultation bilaterally, no wheezing, no crackles. Normal respiratory effort. No accessory muscle use.  Cardiovascular: Regular rate and rhythm, no murmurs / rubs / gallops. No extremity edema. 2+ pedal pulses. No carotid bruits.  Abdomen: no tenderness, no masses palpated. No hepatosplenomegaly. Bowel sounds positive.  Musculoskeletal: no clubbing / cyanosis. No joint deformity upper and lower extremities. Good ROM, no contractures. Normal muscle tone.  Skin: Multiple pressure ulcers noted at the bilateral lower extremities with bullae and surrounding erythema. Neurologic: CN 2-12 grossly intact.  Patient appears able to move all extremities. Psychiatric: Alert, but confused   Labs on Admission: I have personally reviewed following labs and imaging studies  CBC: Recent Labs  Lab 09/19/19 0855  WBC 15.5*  NEUTROABS 13.4*  HGB 18.6*  HCT 58.8*  MCV 91.0  PLT 186   Basic Metabolic Panel: Recent Labs  Lab 09/19/19 0855  NA 154*   K 4.0  CL 114*  CO2 21*  GLUCOSE 264*  BUN 116*  CREATININE 2.30*  CALCIUM 9.5   GFR: Estimated Creatinine Clearance: 32 mL/min (A) (by C-G formula based on SCr of 2.3 mg/dL (H)). Liver Function Tests: Recent Labs  Lab 09/19/19 0855  AST 61*  ALT 36  ALKPHOS 74  BILITOT 1.4*  PROT 7.6  ALBUMIN 3.5   No results for input(s): LIPASE, AMYLASE in the last 168 hours. No results for input(s): AMMONIA in the last 168 hours. Coagulation Profile: No results for input(s): INR, PROTIME in the last 168 hours. Cardiac Enzymes: Recent Labs  Lab 09/19/19 0855  CKTOTAL 1,495*   BNP (last 3 results) No results for input(s): PROBNP in the last 8760 hours. HbA1C: No results for input(s): HGBA1C in the last 72 hours. CBG: Recent Labs  Lab 09/19/19 0831  GLUCAP 252*   Lipid Profile:  No results for input(s): CHOL, HDL, LDLCALC, TRIG, CHOLHDL, LDLDIRECT in the last 72 hours. Thyroid Function Tests: No results for input(s): TSH, T4TOTAL, FREET4, T3FREE, THYROIDAB in the last 72 hours. Anemia Panel: No results for input(s): VITAMINB12, FOLATE, FERRITIN, TIBC, IRON, RETICCTPCT in the last 72 hours. Urine analysis:    Component Value Date/Time   COLORURINE YELLOW 09/16/2017 2220   APPEARANCEUR HAZY (A) 09/16/2017 2220   LABSPEC 1.011 09/16/2017 2220   PHURINE 5.0 09/16/2017 2220   GLUCOSEU 50 (A) 09/16/2017 2220   HGBUR NEGATIVE 09/16/2017 2220   BILIRUBINUR NEGATIVE 09/16/2017 2220   KETONESUR NEGATIVE 09/16/2017 2220   PROTEINUR NEGATIVE 09/16/2017 2220   NITRITE POSITIVE (A) 09/16/2017 2220   LEUKOCYTESUR LARGE (A) 09/16/2017 2220   Sepsis Labs: Recent Results (from the past 240 hour(s))  SARS Coronavirus 2 [facility] order, Performed in [facility] hospital lab) Nasopharyngeal Nasopharyngeal Swab     Status: None   Collection Time: 09/19/19  9:06 AM   Specimen: Nasopharyngeal Swab  Result Value Ref Range Status   SARS Coronavirus 2 NEGATIVE NEGATIVE Final    Comment:  (NOTE) If result is NEGATIVE SARS-CoV-2 target nucleic acids are NOT DETECTED. The SARS-CoV-2 RNA is generally detectable in upper and lower  respiratory specimens during the acute phase of infection. The lowest  concentration of SARS-CoV-2 viral copies this assay can detect is 250  copies / mL. A negative result does not preclude SARS-CoV-2 infection  and should not be used as the sole basis for treatment or other  patient management decisions.  A negative result may occur with  improper specimen collection / handling, submission of specimen other  than nasopharyngeal swab, presence of viral mutation(s) within the  areas targeted by this assay, and inadequate number of viral copies  (<250 copies / mL). A negative result must be combined with clinical  observations, patient history, and epidemiological information. If result is POSITIVE SARS-CoV-2 target nucleic acids are DETECTED. The SARS-CoV-2 RNA is generally detectable in upper and lower  respiratory specimens dur ing the acute phase of infection.  Positive  results are indicative of active infection with SARS-CoV-2.  Clinical  correlation with patient history and other diagnostic information is  necessary to determine patient infection status.  Positive results do  not rule out bacterial infection or co-infection with other viruses. If result is PRESUMPTIVE POSTIVE SARS-CoV-2 nucleic acids MAY BE PRESENT.   A presumptive positive result was obtained on the submitted specimen  and confirmed on repeat testing.  While 2019 novel coronavirus  (SARS-CoV-2) nucleic acids may be present in the submitted sample  additional confirmatory testing may be necessary for epidemiological  and / or clinical management purposes  to differentiate between  SARS-CoV-2 and other Sarbecovirus currently known to infect humans.  If clinically indicated additional testing with an alternate test  methodology [phone]) is advised. The SARS-CoV-2 RNA is  generally  detectable in upper and lower respiratory sp ecimens during the acute  phase of infection. The expected result is Negative. Fact Sheet for Patients:  BoilerBrush.com.cy Fact Sheet for Healthcare Providers: https://pope.com/ This test is not yet approved or cleared by the [place] FDA and has been authorized for detection and/or diagnosis of SARS-CoV-2 by FDA under an Emergency Use Authorization (EUA).  This EUA will remain in effect (meaning this test can be used) for the duration of the COVID-19 declaration under Section 564(b)(1) of the Act, 21 U.S.C. section 360bbb-3(b)(1), unless the authorization is terminated or revoked sooner. Performed at  [facility], [address] [place], [place] [address]      Radiological Exams on Admission: Dg Pelvis 1-2 Views  Result Date: 09/19/2019 CLINICAL DATA:  Pain after fall EXAM: PELVIS - 1-2 VIEW COMPARISON:  None. FINDINGS: There is no evidence of pelvic fracture or diastasis. No pelvic bone lesions are seen. IMPRESSION: Negative. Electronically Signed   By: Gerome Samavid  Williams III M.D   On: 09/19/2019 10:22   Ct Head Wo Contrast  Result Date: 09/19/2019 CLINICAL DATA:  Headache. EXAM: CT HEAD WITHOUT CONTRAST CT CERVICAL SPINE WITHOUT CONTRAST TECHNIQUE: Multidetector CT imaging of the head and cervical spine was performed following the standard protocol without intravenous contrast. Multiplanar CT image reconstructions of the cervical spine were also generated. COMPARISON:  None. FINDINGS: CT HEAD FINDINGS Brain: No evidence of acute infarction, hemorrhage, hydrocephalus, extra-axial collection or mass lesion/mass effect. There is mild diffuse low-attenuation within the subcortical and periventricular white matter compatible with chronic microvascular disease. Enlargement of the sulci and ventricles compatible with brain atrophy. Vascular: No hyperdense vessel or unexpected  calcification. Skull: Normal. Negative for fracture or focal lesion. Sinuses/Orbits: No acute finding. Other: None. CT CERVICAL SPINE FINDINGS Alignment: Normal. Skull base and vertebrae: No acute fracture. No primary bone lesion or focal pathologic process. Soft tissues and spinal canal: No prevertebral fluid or swelling. No visible canal hematoma. Disc levels: Marked multi level disc space narrowing and endplate spurring. Upper chest: Negative. Other: None IMPRESSION: 1. No acute intracranial abnormality. 2. Chronic small vessel ischemic change and brain atrophy. 3. No evidence for cervical spine fracture. 4. Cervical degenerative disc disease. Electronically Signed   By: Signa Kellaylor  Stroud M.D.   On: 09/19/2019 09:52   Ct Cervical Spine Wo Contrast  Result Date: 09/19/2019 CLINICAL DATA:  Headache. EXAM: CT HEAD WITHOUT CONTRAST CT CERVICAL SPINE WITHOUT CONTRAST TECHNIQUE: Multidetector CT imaging of the head and cervical spine was performed following the standard protocol without intravenous contrast. Multiplanar CT image reconstructions of the cervical spine were also generated. COMPARISON:  None. FINDINGS: CT HEAD FINDINGS Brain: No evidence of acute infarction, hemorrhage, hydrocephalus, extra-axial collection or mass lesion/mass effect. There is mild diffuse low-attenuation within the subcortical and periventricular white matter compatible with chronic microvascular disease. Enlargement of the sulci and ventricles compatible with brain atrophy. Vascular: No hyperdense vessel or unexpected calcification. Skull: Normal. Negative for fracture or focal lesion. Sinuses/Orbits: No acute finding. Other: None. CT CERVICAL SPINE FINDINGS Alignment: Normal. Skull base and vertebrae: No acute fracture. No primary bone lesion or focal pathologic process. Soft tissues and spinal canal: No prevertebral fluid or swelling. No visible canal hematoma. Disc levels: Marked multi level disc space narrowing and endplate  spurring. Upper chest: Negative. Other: None IMPRESSION: 1. No acute intracranial abnormality. 2. Chronic small vessel ischemic change and brain atrophy. 3. No evidence for cervical spine fracture. 4. Cervical degenerative disc disease. Electronically Signed   By: Signa Kellaylor  Stroud M.D.   On: 09/19/2019 09:52   Dg Chest Portable 1 View  Result Date: 09/19/2019 CLINICAL DATA:  Pain after fall EXAM: PORTABLE CHEST 1 VIEW COMPARISON:  None. FINDINGS: There is a tortuous thoracic aorta. The heart, hila, mediastinum, lungs, and pleura are otherwise unremarkable. IMPRESSION: No active disease. Electronically Signed   By: Gerome Samavid  Williams III M.D   On: 09/19/2019 10:33   Dg Tibia/fibula Left Port  Result Date: 09/19/2019 CLINICAL DATA:  Pain after fall EXAM: PORTABLE LEFT TIBIA AND FIBULA - 2 VIEW COMPARISON:  None. FINDINGS: Degenerative changes  in the knee.  No fractures. IMPRESSION: No fractures. Electronically Signed   By: Gerome Sam III M.D   On: 09/19/2019 10:23   Dg Hand Complete Right  Result Date: 09/19/2019 CLINICAL DATA:  Pain after fall EXAM: RIGHT HAND - COMPLETE 3+ VIEW COMPARISON:  None. FINDINGS: Known usual configuration of the distal radius is likely due to remote healed trauma. No acute fracture noted. IMPRESSION: No acute fractures are seen. Electronically Signed   By: Gerome Sam III M.D   On: 09/19/2019 10:25   Dg Foot Complete Left  Result Date: 09/19/2019 CLINICAL DATA:  Pain after fall EXAM: LEFT FOOT - COMPLETE 3+ VIEW COMPARISON:  None. FINDINGS: There is no evidence of fracture or dislocation. There is no evidence of arthropathy or other focal bone abnormality. Soft tissues are unremarkable. IMPRESSION: Negative. Electronically Signed   By: Gerome Sam III M.D   On: 09/19/2019 10:28   Dg Foot Complete Right  Result Date: 09/19/2019 CLINICAL DATA:  Pain after fall EXAM: RIGHT FOOT COMPLETE - 3+ VIEW COMPARISON:  None. FINDINGS: Severe soft tissue swelling in the foot,  particularly along the top of the foot. No fractures, dislocations, or bony erosion. IMPRESSION: Soft tissue swelling.  No other acute abnormalities. Electronically Signed   By: Gerome Sam III M.D   On: 09/19/2019 10:26   Dg Femur Portable 1 View Left  Result Date: 09/19/2019 CLINICAL DATA:  Pain after fall EXAM: LEFT FEMUR PORTABLE 1 VIEW COMPARISON:  None. FINDINGS: Degenerative changes are seen in the knee.  No fractures are seen. IMPRESSION: Negative. Electronically Signed   By: Gerome Sam III M.D   On: 09/19/2019 10:32   Dg Femur Portable 1 View Right  Result Date: 09/19/2019 CLINICAL DATA:  Pain after fall EXAM: RIGHT FEMUR PORTABLE 1 VIEW COMPARISON:  None. FINDINGS: Degenerative changes in the knee.  No fractures seen in the femur. IMPRESSION: No fractures identified. Electronically Signed   By: Gerome Sam III M.D   On: 09/19/2019 10:30    EKG: Independently reviewed.  Sinus rhythm 94 bpm  Assessment/Plan Sepsis secondary to urinary tract infection and/or pressure ulcers with cellulitis: Acute.  On admission patient was seen to be afebrile.  WBC elevated at 15.5 with lactic acid 3.3.  Chest x-ray did not show any acute abnormalities.  UA positive for moderate hemoglobin, large leukocytes, 21-50 RBCs, 21-50 WBCs, and many bacteria.  She was also noted to have lower extremity wounds with surrounding erythema concerning for cellulitis.  Patient was initially given empiric antibiotics of vancomycin and cefepime. -Admit to a medical telemetry -Follow-up blood and urine cultures -Continue empiric antibiotics vancomycin and de-escalate cefepime to Rocephin -Low-air-loss mattress replacement -Consult wound care nurse for lower extremity wounds  Acute metabolic encephalopathy: Patient altered and appears confused.  CT scan brain negative for any acute abnormalities.  Suspect multifactorial nature given infection and dehydration. -Neurochecks  Hypernatremia: Acute.  On admission  sodium 154 on admission.  Patient was initially placed on quarter normal saline. - Change IV fluids to D5 IV fluids 125 mL/h as tolerated -Serial monitoring of BMP  Mild rhabdomyolysis: The patient was found down on the floor for time. CK 1495.  -Recheck CK in a.m. -Continue IV fluids  -Will need PT and OT to eval for need of skilled nursing facility placement/rehab.  Acute kidney injury secondary to severe dehydration: Initial creatinine elevated up to 2.3 with BUN 116.  Creatinine elevated to 2.3, baseline last noted to be around 1.27 in  08/2017. -Hold nephrotoxic agents -Continue to monitor kidney function  Elevated troponin: Acute.  On admission high-sensitivity troponin 26.  Patient denies any complaints of chest pain. -Follow-up telemetry overnight  Diabetes mellitus type 2: Last documented hemoglobin A1c on file 6.7. -Hypoglycemic protocols -Hold dapagliflozin-metformin -CBGs q. before meals and at bedtime with moderate SSI  Essential hypertension -Hold furosemide and olmesartan  -Continue Bystolic  Transaminitis: Acute.  AST mildly elevated at 61.  Suspect related with rhabdomyolysis.  -Recheck CMP in a.m.   DVT prophylaxis: heparin Code Status: Full Family Communication:  Disposition Plan: Possible discharge to skilled nursing facility once medically stable Consults called: none Admission status:inpatient  Clydie Braun MD [facility] Pager [phone]   If 7PM-7AM, please contact night-coverage [email or web address] Password [email or web address]  09/19/2019, 11:33 AM

## 2019-09-19 NOTE — ED Notes (Signed)
Patient transported to X-ray 

## 2019-09-19 NOTE — ED Notes (Signed)
Lactic acid results given to APP

## 2019-09-19 NOTE — Progress Notes (Addendum)
Paged by nursing around 1900 regarding patient Loretta [name]. She was admitted earlier around noon by Dr. Tamala Julian. Patient was in ED holding awaiting a bed upstairs. She is being admitted for sepsis secondary to UTI, pressure ulcers, cellulitis and rhabdomyolysis.  Attending nurse was concerned because patient was complaining of severe pain when attempting to move her for cleaning. Also concerned if she would be appropriate for med-telemetry bed.   I evaluated patient at bedside. She was an ill-appearing morbidly obese female laying flat in bed with visible dry oral mucosa and celluitis with blisters on bilaterally lower extremity. She is complaining of pain to her left inguinal region. There is notable erythematous rash to her left inguinal fold likely yeast infection. Minimal pain to palpation of the left hip and also had no fractures seen on pelvic and left femur x-ray. She had 2/5 strength of the left LE but I suspect it is due to her obese habitus and rhabdomyolysis. She is otherwise hemodynamically stable.  Okay to turn over for cleaning and evaluation for any sacral wounds.  I agree with Dr. Fuller Plan that patient can precede to med-telemetry floor.   I have independently reviewed all labs and imaging.   Will add K supplementation for her hypokalemia of 2.7 Will add Nystatin for candiasis in left inguinal fold

## 2019-09-19 NOTE — ED Notes (Signed)
Pt has removed monitors . Same replaced.

## 2019-09-19 NOTE — ED Triage Notes (Signed)
Pt here from [facility], son has not heard from her since Thursday so called the facility for a wellfare check today. Staff went to check on her this morning and found her beside the bed on the ground. R leg looks deformed, bilateral feet moist and gray. Blood in mouth, boils on legs. Pt altered, initially unresponsive with FD, on arrival answering questions appropriately. Pt remembers falling.

## 2019-09-19 NOTE — Progress Notes (Addendum)
Notified bedside nurse of need to draw repeat lactic acid. RN stated pt was very hard stick.

## 2019-09-19 NOTE — ED Notes (Addendum)
Stuck pt.x2 unable to get blood reported to RN.MICHEAL.

## 2019-09-19 NOTE — Progress Notes (Signed)
Pharmacy Antibiotic Note  Loretta [name] is a 73 y.o. female admitted on 09/19/2019 with sepsis.  Pharmacy has been consulted for vancomycin and cefepime dosing. Pt is afebrile but WBC is elevated. Scr is elevated at 2.3 and lactic acid is elevated at 3.3.   Plan: Vancomycin 2gm IV x 1 then 1500mg  IV Q48H Cefepime 2gm IV Q12H F/u renal fxn, C&S, clinical status and peak/trough at SS   Weight: 280 lb (127 kg)(bed scale)  Temp (24hrs), Avg:97.9 F (36.6 C), Min:97.8 F (36.6 C), Max:98 F (36.7 C)  Recent Labs  Lab 09/19/19 0855  WBC 15.5*  CREATININE 2.30*  LATICACIDVEN 3.3*    CrCl cannot be calculated (Unknown ideal weight.).    Allergies  Allergen Reactions  . Pravastatin Other (See Comments)    Caused leg paralysis     Antimicrobials this admission: Vanc 9/29>> Cefepime 9/29>> Flagyl x 1 9/29  Dose adjustments this admission: N/A  Microbiology results: Pending  Thank you for allowing pharmacy to be a part of this patient's care.  Syniyah Bourne, Rande Lawman 09/19/2019 10:28 AM

## 2019-09-19 NOTE — ED Provider Notes (Signed)
  Ultrasound ED Peripheral IV (Provider)  Date/Time: 09/19/2019 3:25 PM Performed by: Lorayne Bender, PA-C Authorized by: Lorayne Bender, PA-C   Procedure details:    Indications: multiple failed IV attempts and poor IV access     Skin Prep: chlorhexidine gluconate     Location:  Left AC   Angiocath:  20 G   Bedside Ultrasound Guided: Yes     Images: not archived     Patient tolerated procedure without complications: Yes     Dressing applied: Yes   Comments:     Positive flash.  Advanced and flushed without pain, swelling, or other signs of infiltration.     Lorayne Bender, PA-C 09/19/19 1543    Carmin Muskrat, MD 09/19/19 2127

## 2019-09-19 NOTE — ED Notes (Signed)
Pt going to Ct then to XRAY

## 2019-09-19 NOTE — ED Notes (Signed)
Dr. Tamala Julian at  Bedside

## 2019-09-19 NOTE — ED Provider Notes (Signed)
[facility] Provider Note   CSN: [id number] Arrival date & time: 09/19/19  [id number]     History   Chief Complaint Chief Complaint  Patient presents with  . Fall  . Altered Mental Status    HPI Miguel DibbleCarol A Dittmar is a 73 y.o. female.     HPI   Patient is a 73 year old female with a history of diabetes, hyperlipidemia, hypertension, who presents to the emergency department today for evaluation fall and altered mental status.  Patient coming from currently in independent living.  Per EMS she is normally alert and oriented and living independently.  Her son's RA is an internal medicine physician.  He had not spoken with the patient since Thursday 9/25, he called the facility today who did a wellness check and they found the patient on the floor.  Patient states she fell and she does not know how long she has been on the floor.  She complains of pain to the left hip, bilateral femurs, neck, upper back.  Per EMS, pt was covered in urine and feces. Her socks were also wet from incontinence and bilat feet are moist.   Pt is answering some questions but she does appear confused and therefore there is a level 5 caveat.   Past Medical History:  Diagnosis Date  . Diabetes mellitus without complication (HCC)    Type  . Hyperlipidemia   . Hypertension     Patient Active Problem List   Diagnosis Date Noted  . Bradycardia 09/17/2017  . AKI (acute kidney injury) (HCC) 09/16/2017  . Closed right ankle fracture 01/04/2017  . Displaced fracture of lateral malleolus of right fibula, subsequent encounter for closed fracture with routine healing   . Bimalleolar ankle fracture 12/15/2016  . DM2 (diabetes mellitus, type 2) (HCC) 12/15/2016  . HTN (hypertension) 12/15/2016  . Hypokalemia 12/15/2016  . Hypernatremia 12/15/2016    Past Surgical History:  Procedure Laterality Date  . ORIF ANKLE FRACTURE Right 01/04/2017   Procedure: OPEN REDUCTION INTERNAL  FIXATION (ORIF) RIGHT ANKLE FRACTURE;  Surgeon: Eldred MangesMark C Yates, MD;  Location: [facility] OR;  Service: Orthopedics;  Laterality: Right;     OB History   No obstetric history on file.      Home Medications    Prior to Admission medications   Medication Sig Start Date End Date Taking? Authorizing Provider  amLODipine (NORVASC) 5 MG tablet Take 5 mg by mouth daily.  09/27/16  Yes [provider]  aspirin EC 325 MG tablet Take 1 tablet (325 mg total) by mouth daily. Patient taking differently: Take 81 mg by mouth daily.  01/05/17  Yes Naida Sleightwens, James M, PA-C  atorvastatin (LIPITOR) 20 MG tablet Take 20 mg by mouth at bedtime.  10/19/16  Yes [provider]  Dapagliflozin-Metformin HCl ER (XIGDUO XR) 09-999 MG TB24 Take 1 tablet by mouth daily.   Yes [provider]  furosemide (LASIX) 40 MG tablet Take 1 tablet (40 mg total) by mouth daily. 09/18/17 09/19/19 Yes Jerald Kiefhiu, Stephen K, MD  ketoconazole (NIZORAL) 2 % cream Apply 1 application topically daily as needed (scalp irritation).  09/06/17  Yes [provider]  KLOR-CON M20 20 MEQ tablet Take 20 mEq by mouth daily. 09/08/17  Yes [provider]  Multiple Vitamin (MULTIVITAMIN WITH MINERALS) TABS tablet Take 1 tablet by mouth daily.   Yes [provider]  nebivolol (BYSTOLIC) 10 MG tablet Take 10 mg by mouth daily.   Yes [provider]  olmesartan (BENICAR) 20 MG tablet Take 20 mg by mouth daily.   Yes [provider]  Vitamin D, Ergocalciferol, 2000 units CAPS Take 1 capsule by mouth daily.   Yes [provider]  cefpodoxime (VANTIN) 200 MG tablet Take 1 tablet (200 mg total) by mouth 2 (two) times daily. Patient not taking: Reported on 09/19/2019 09/18/17   Jerald Kief, MD    Family History Family History  Problem Relation Age of Onset  . Congestive Heart Failure Mother   . Diabetes Mellitus II Father   . Osteoporosis Neg Hx     Social History Social History    Tobacco Use  . Smoking status: Never Smoker  . Smokeless tobacco: Never Used  Substance Use Topics  . Alcohol use: No  . Drug use: No     Allergies   Pravastatin   Review of Systems Review of Systems  Unable to perform ROS: Mental status change  Constitutional: Negative for fever.  Cardiovascular: Positive for chest pain (chest wall pain).  Gastrointestinal: Positive for abdominal pain. Negative for vomiting.  Musculoskeletal: Positive for back pain and neck pain.  Skin: Positive for wound.  All other systems reviewed and are negative.    Physical Exam Updated Vital Signs BP (!) 170/144 (BP Location: Right Arm)   Pulse 71   Temp 98 F (36.7 C) (Rectal)   Resp 18   Ht  (1.727 m)   Wt 127 kg Comment: bed scale  SpO2 98%   BMI 42.57 kg/m   Physical Exam Vitals signs and nursing note reviewed.  Constitutional:      General: She is not in acute distress.    Appearance: She is well-developed. She is obese. She is ill-appearing.  HENT:     Head: Normocephalic and atraumatic.     Mouth/Throat:     Mouth: Mucous membranes are dry.     Comments: Dried blood around lips and teeth. No fractured teeth seen. Eyes:     Extraocular Movements: Extraocular movements intact.     Conjunctiva/sclera: Conjunctivae normal.     Pupils: Pupils are equal, round, and reactive to light.  Neck:     Musculoskeletal: Neck supple.  Cardiovascular:     Rate and Rhythm: Normal rate and regular rhythm.     Pulses: Normal pulses.          Dorsalis pedis pulses are 2+ on the right side and 2+ on the left side.     Heart sounds: Normal heart sounds. No murmur.  Pulmonary:     Effort: Pulmonary effort is normal. No respiratory distress.     Breath sounds: Normal breath sounds. No wheezing, rhonchi or rales.  Chest:     Chest wall: Tenderness (bilat upper chest) present.  Abdominal:     General: Bowel sounds are normal.     Palpations: Abdomen is soft.     Tenderness: There is  abdominal tenderness (RLQ, LLQ (minimal)). There is no guarding or rebound.  Musculoskeletal:     Comments: TTP to the left hip, bilat femurs, left tib/fib. Bilat feet appear moist with blistering to the plantar ascpects bilat. Left 4th/5th toes are dusky and left 5th toe has slow blood oozing. TTP to the cervical spine. Erythema overlying MCPs on the right hand  Skin:    General: Skin is warm and dry.     Comments: Large skin tear inferior to the left buttocks and left calf. Abrasion and bullae noted to the posterior right knee and  just medial to this. There is some surrounding erythema up to the thigh. Superficial pressure wound to the mid back.   Neurological:     Mental Status: She is alert.     Comments: Alert, answering some questions appropriately but intermittently does appear confused and will need redirection to answer questions. Oriented to self/situation, but not date/location. Unable to test strength to BLE 2/2 pain. RUE grip strength is weak compared to right. Cranial nerves II-XII are intact.      ED Treatments / Results  Labs (all labs ordered are listed, but only abnormal results are displayed) Labs Reviewed  CBC WITH DIFFERENTIAL/PLATELET - Abnormal; Notable for the following components:      Result Value   WBC 15.5 (*)    RBC 6.46 (*)    Hemoglobin 18.6 (*)    HCT 58.8 (*)    Neutro Abs 13.4 (*)    Abs Immature Granulocytes 0.08 (*)    All other components within normal limits  COMPREHENSIVE METABOLIC PANEL - Abnormal; Notable for the following components:   Sodium 154 (*)    Chloride 114 (*)    CO2 21 (*)    Glucose, Bld 264 (*)    BUN 116 (*)    Creatinine, Ser 2.30 (*)    AST 61 (*)    Total Bilirubin 1.4 (*)    GFR calc non Af Amer 21 (*)    GFR calc Af Amer 24 (*)    Anion gap 19 (*)    All other components within normal limits  CK - Abnormal; Notable for the following components:   Total CK 1,495 (*)    All other components within normal limits   LACTIC ACID, PLASMA - Abnormal; Notable for the following components:   Lactic Acid, Venous 3.3 (*)    All other components within normal limits  CBG MONITORING, ED - Abnormal; Notable for the following components:   Glucose-Capillary 252 (*)    All other components within normal limits  TROPONIN I (HIGH SENSITIVITY) - Abnormal; Notable for the following components:   Troponin I (High Sensitivity) 26 (*)    All other components within normal limits  SARS CORONAVIRUS 2 (HOSPITAL ORDER, PERFORMED IN [facility])  CULTURE, BLOOD (ROUTINE X 2)  CULTURE, BLOOD (ROUTINE X 2)  URINE CULTURE  LACTIC ACID, PLASMA  URINALYSIS, ROUTINE W REFLEX MICROSCOPIC  TROPONIN I (HIGH SENSITIVITY)    EKG EKG Interpretation  Date/Time:  Tuesday September 19 2019 08:22:27 EDT Ventricular Rate:  94 PR Interval:    QRS Duration: 101 QT Interval:  347 QTC Calculation: 434 R Axis:   17 Text Interpretation:  Sinus rhythm Repol abnrm suggests ischemia, lateral leads Confirmed by Benjiman Core [id number]) on 09/19/2019 8:27:54 AM   Radiology Dg Pelvis 1-2 Views  Result Date: 09/19/2019 CLINICAL DATA:  Pain after fall EXAM: PELVIS - 1-2 VIEW COMPARISON:  None. FINDINGS: There is no evidence of pelvic fracture or diastasis. No pelvic bone lesions are seen. IMPRESSION: Negative. Electronically Signed   By: Gerome Sam III M.D   On: 09/19/2019 10:22   Ct Head Wo Contrast  Result Date: 09/19/2019 CLINICAL DATA:  Headache. EXAM: CT HEAD WITHOUT CONTRAST CT CERVICAL SPINE WITHOUT CONTRAST TECHNIQUE: Multidetector CT imaging of the head and cervical spine was performed following the standard protocol without intravenous contrast. Multiplanar CT image reconstructions of the cervical spine were also generated. COMPARISON:  None. FINDINGS: CT HEAD FINDINGS Brain: No evidence of acute infarction, hemorrhage, hydrocephalus,  extra-axial collection or mass lesion/mass effect. There is mild diffuse  low-attenuation within the subcortical and periventricular white matter compatible with chronic microvascular disease. Enlargement of the sulci and ventricles compatible with brain atrophy. Vascular: No hyperdense vessel or unexpected calcification. Skull: Normal. Negative for fracture or focal lesion. Sinuses/Orbits: No acute finding. Other: None. CT CERVICAL SPINE FINDINGS Alignment: Normal. Skull base and vertebrae: No acute fracture. No primary bone lesion or focal pathologic process. Soft tissues and spinal canal: No prevertebral fluid or swelling. No visible canal hematoma. Disc levels: Marked multi level disc space narrowing and endplate spurring. Upper chest: Negative. Other: None IMPRESSION: 1. No acute intracranial abnormality. 2. Chronic small vessel ischemic change and brain atrophy. 3. No evidence for cervical spine fracture. 4. Cervical degenerative disc disease. Electronically Signed   By: Signa Kell M.D.   On: 09/19/2019 09:52   Ct Cervical Spine Wo Contrast  Result Date: 09/19/2019 CLINICAL DATA:  Headache. EXAM: CT HEAD WITHOUT CONTRAST CT CERVICAL SPINE WITHOUT CONTRAST TECHNIQUE: Multidetector CT imaging of the head and cervical spine was performed following the standard protocol without intravenous contrast. Multiplanar CT image reconstructions of the cervical spine were also generated. COMPARISON:  None. FINDINGS: CT HEAD FINDINGS Brain: No evidence of acute infarction, hemorrhage, hydrocephalus, extra-axial collection or mass lesion/mass effect. There is mild diffuse low-attenuation within the subcortical and periventricular white matter compatible with chronic microvascular disease. Enlargement of the sulci and ventricles compatible with brain atrophy. Vascular: No hyperdense vessel or unexpected calcification. Skull: Normal. Negative for fracture or focal lesion. Sinuses/Orbits: No acute finding. Other: None. CT CERVICAL SPINE FINDINGS Alignment: Normal. Skull base and vertebrae: No  acute fracture. No primary bone lesion or focal pathologic process. Soft tissues and spinal canal: No prevertebral fluid or swelling. No visible canal hematoma. Disc levels: Marked multi level disc space narrowing and endplate spurring. Upper chest: Negative. Other: None IMPRESSION: 1. No acute intracranial abnormality. 2. Chronic small vessel ischemic change and brain atrophy. 3. No evidence for cervical spine fracture. 4. Cervical degenerative disc disease. Electronically Signed   By: Signa Kell M.D.   On: 09/19/2019 09:52   Dg Chest Portable 1 View  Result Date: 09/19/2019 CLINICAL DATA:  Pain after fall EXAM: PORTABLE CHEST 1 VIEW COMPARISON:  None. FINDINGS: There is a tortuous thoracic aorta. The heart, hila, mediastinum, lungs, and pleura are otherwise unremarkable. IMPRESSION: No active disease. Electronically Signed   By: Gerome Sam III M.D   On: 09/19/2019 10:33   Dg Tibia/fibula Left Port  Result Date: 09/19/2019 CLINICAL DATA:  Pain after fall EXAM: PORTABLE LEFT TIBIA AND FIBULA - 2 VIEW COMPARISON:  None. FINDINGS: Degenerative changes in the knee.  No fractures. IMPRESSION: No fractures. Electronically Signed   By: Gerome Sam III M.D   On: 09/19/2019 10:23   Dg Hand Complete Right  Result Date: 09/19/2019 CLINICAL DATA:  Pain after fall EXAM: RIGHT HAND - COMPLETE 3+ VIEW COMPARISON:  None. FINDINGS: Known usual configuration of the distal radius is likely due to remote healed trauma. No acute fracture noted. IMPRESSION: No acute fractures are seen. Electronically Signed   By: Gerome Sam III M.D   On: 09/19/2019 10:25   Dg Foot Complete Left  Result Date: 09/19/2019 CLINICAL DATA:  Pain after fall EXAM: LEFT FOOT - COMPLETE 3+ VIEW COMPARISON:  None. FINDINGS: There is no evidence of fracture or dislocation. There is no evidence of arthropathy or other focal bone abnormality. Soft tissues are unremarkable. IMPRESSION: Negative. Electronically Signed  By: Gerome Sam III M.D   On: 09/19/2019 10:28   Dg Foot Complete Right  Result Date: 09/19/2019 CLINICAL DATA:  Pain after fall EXAM: RIGHT FOOT COMPLETE - 3+ VIEW COMPARISON:  None. FINDINGS: Severe soft tissue swelling in the foot, particularly along the top of the foot. No fractures, dislocations, or bony erosion. IMPRESSION: Soft tissue swelling.  No other acute abnormalities. Electronically Signed   By: Gerome Sam III M.D   On: 09/19/2019 10:26   Dg Femur Portable 1 View Left  Result Date: 09/19/2019 CLINICAL DATA:  Pain after fall EXAM: LEFT FEMUR PORTABLE 1 VIEW COMPARISON:  None. FINDINGS: Degenerative changes are seen in the knee.  No fractures are seen. IMPRESSION: Negative. Electronically Signed   By: Gerome Sam III M.D   On: 09/19/2019 10:32   Dg Femur Portable 1 View Right  Result Date: 09/19/2019 CLINICAL DATA:  Pain after fall EXAM: RIGHT FEMUR PORTABLE 1 VIEW COMPARISON:  None. FINDINGS: Degenerative changes in the knee.  No fractures seen in the femur. IMPRESSION: No fractures identified. Electronically Signed   By: Gerome Sam III M.D   On: 09/19/2019 10:30    Procedures Procedures (including critical care time)  Medications Ordered in ED Medications  metroNIDAZOLE (FLAGYL) IVPB 500 mg (has no administration in time range)  ceFEPIme (MAXIPIME) 2 g in sodium chloride 0.9 % 100 mL IVPB (has no administration in time range)  vancomycin (VANCOCIN) 2,000 mg in sodium chloride 0.9 % 500 mL IVPB (has no administration in time range)  vancomycin (VANCOCIN) 1,500 mg in sodium chloride 0.9 % 500 mL IVPB (has no administration in time range)  ceFEPIme (MAXIPIME) 2 g in sodium chloride 0.9 % 100 mL IVPB (has no administration in time range)  0.45 % sodium chloride infusion (has no administration in time range)  sodium chloride 0.9 % bolus 1,000 mL (1,000 mLs Intravenous New Bag/Given 09/19/19 0855)  morphine 4 MG/ML injection 4 mg (4 mg Intravenous Given 09/19/19 0855)  Tdap  (BOOSTRIX) injection 0.5 mL (0.5 mLs Intramuscular Given 09/19/19 0856)     Initial Impression / Assessment and Plan / ED Course  I have reviewed the triage vital signs and the nursing notes.  Pertinent labs & imaging results that were available during my care of the patient were reviewed by me and considered in my medical decision making (see chart for details).   Final Clinical Impressions(s) / ED Diagnoses   Final diagnoses:  Hypernatremia  AKI (acute kidney injury) (HCC)   73 year old female presenting for evaluation of fall and altered mental status.  She lives in an independent living facility.  She is not sure when she fell but has been on the floor for an unknown period of time.  Labs revealed: CBC with leukocytosis and elevated hgb  CMP with hypernatremia/hyperchloremia, elevated BUN/Cr at 116/2.3 which I feel is likely prerenal in setting of volume depletion. AST slightly elevated. Low bicarb and elevated anion gap at 19.   - Pt give 1L NS and started on d5 1/2 NS infusion.  Trop mildly elevated at 26, I suspect this may be secondary to demand.  CK mildly elevated at 1495 Lactic acid elevated at 3.3 COVID testing is negative  Imaging revealed: Normal xrays of the left tib/fib, pelvis, right hand, bilat feet, bilat femurs, and chest CT head w/o intracranial bleeding CT cervical spine w/o evidence for acute traumatic injury  Due to concern for possible sepsis in setting of AMS, leukocytosis, and lactic acidosis  Will order dose of broad spectrum abx. At this time, infectious etiology is unclear but patient does have multiple open wounds that have been exposed to urine/feces for several days. I do not feel that the patient is in septic shock.   10:53 AM Diuscused case with the patient son Dr. Raul Del who was appreciative of update. He is the patient's POA. He states she is currently a full code.   11:36 AM Discussed case with Dr. Tamala Julian who accepts patient for admission.    Pt was seen in conjunction with Dr. Alvino Chapel who personally evaluated the patient and is in agreement with plan.  ED Discharge Orders    None       Bishop Dublin 09/19/19 1144    Davonna Belling, MD 09/19/19 [phone]

## 2019-09-19 NOTE — ED Notes (Signed)
Pt cleaned of urine and rolled to replace linen. Pt c/o pain with rolling. Skin tear noted at left gluteal fold. Quarter size blisters noted at right shin with excoriated skin noted in bilateral  knee skin folds. mulitple skin tears noted at lower extremities, Reddened skin noted and cleaned beneath breat and at groin and abdominal skin folds. Toes at left foot noted 3/4/5th toes dscolored and swollen with skin tears noted. Guaze dressing with telfa placed to cover toes after cleaning with saline.  Wound at left gluteal skin fold also cleaned and covered with telfa. Skin tear noted to back of left lower leg also cleaned and covered with telfa.

## 2019-09-20 DIAGNOSIS — I96 Gangrene, not elsewhere classified: Secondary | ICD-10-CM

## 2019-09-20 DIAGNOSIS — N179 Acute kidney failure, unspecified: Secondary | ICD-10-CM

## 2019-09-20 LAB — URINE CULTURE

## 2019-09-20 LAB — CBC
HCT: 59.1 % — ABNORMAL HIGH (ref 36.0–46.0)
Hemoglobin: 18.3 g/dL — ABNORMAL HIGH (ref 12.0–15.0)
MCH: 29.4 pg (ref 26.0–34.0)
MCHC: 31 g/dL (ref 30.0–36.0)
MCV: 94.9 fL (ref 80.0–100.0)
Platelets: 120 10*3/uL — ABNORMAL LOW (ref 150–400)
RBC: 6.23 MIL/uL — ABNORMAL HIGH (ref 3.87–5.11)
RDW: 14.9 % (ref 11.5–15.5)
WBC: 15.7 10*3/uL — ABNORMAL HIGH (ref 4.0–10.5)
nRBC: 0 % (ref 0.0–0.2)

## 2019-09-20 LAB — CK
Total CK: 1219 U/L — ABNORMAL HIGH (ref 38–234)
Total CK: 1672 U/L — ABNORMAL HIGH (ref 38–234)

## 2019-09-20 LAB — COMPREHENSIVE METABOLIC PANEL
ALT: 28 U/L (ref 0–44)
ALT: 38 U/L (ref 0–44)
AST: 49 U/L — ABNORMAL HIGH (ref 15–41)
AST: 68 U/L — ABNORMAL HIGH (ref 15–41)
Albumin: 2 g/dL — ABNORMAL LOW (ref 3.5–5.0)
Albumin: 2.6 g/dL — ABNORMAL LOW (ref 3.5–5.0)
Alkaline Phosphatase: 47 U/L (ref 38–126)
Alkaline Phosphatase: 66 U/L (ref 38–126)
Anion gap: 13 (ref 5–15)
Anion gap: 17 — ABNORMAL HIGH (ref 5–15)
BUN: 58 mg/dL — ABNORMAL HIGH (ref 8–23)
BUN: 75 mg/dL — ABNORMAL HIGH (ref 8–23)
CO2: 15 mmol/L — ABNORMAL LOW (ref 22–32)
CO2: 18 mmol/L — ABNORMAL LOW (ref 22–32)
Calcium: 6.7 mg/dL — ABNORMAL LOW (ref 8.9–10.3)
Calcium: 8.8 mg/dL — ABNORMAL LOW (ref 8.9–10.3)
Chloride: 91 mmol/L — ABNORMAL LOW (ref 98–111)
Chloride: 118 mmol/L — ABNORMAL HIGH (ref 98–111)
Creatinine, Ser: 1.11 mg/dL — ABNORMAL HIGH (ref 0.44–1.00)
Creatinine, Ser: 1.27 mg/dL — ABNORMAL HIGH (ref 0.44–1.00)
GFR calc Af Amer: 58 mL/min — ABNORMAL LOW (ref >60)
GFR calc Af Amer: 50 mL/min — ABNORMAL LOW (ref >60)
GFR calc non Af Amer: 50 mL/min — ABNORMAL LOW (ref >60)
GFR calc non Af Amer: 43 mL/min — ABNORMAL LOW (ref >60)
Glucose, Bld: 1196 mg/dL (ref 70–99)
Glucose, Bld: 267 mg/dL — ABNORMAL HIGH (ref 70–99)
Potassium: 2.8 mmol/L — ABNORMAL LOW (ref 3.5–5.1)
Potassium: 3.9 mmol/L (ref 3.5–5.1)
Sodium: 119 mmol/L — CL (ref 135–145)
Sodium: 153 mmol/L — ABNORMAL HIGH (ref 135–145)
Total Bilirubin: 1.4 mg/dL — ABNORMAL HIGH (ref 0.3–1.2)
Total Bilirubin: 2 mg/dL — ABNORMAL HIGH (ref 0.3–1.2)
Total Protein: 4.8 g/dL — ABNORMAL LOW (ref 6.5–8.1)
Total Protein: 6.2 g/dL — ABNORMAL LOW (ref 6.5–8.1)

## 2019-09-20 LAB — CBG MONITORING, ED
Glucose-Capillary: 160 mg/dL — ABNORMAL HIGH (ref 70–99)
Glucose-Capillary: 210 mg/dL — ABNORMAL HIGH (ref 70–99)
Glucose-Capillary: 239 mg/dL — ABNORMAL HIGH (ref 70–99)
Glucose-Capillary: 277 mg/dL — ABNORMAL HIGH (ref 70–99)

## 2019-09-20 LAB — GLUCOSE, CAPILLARY
Glucose-Capillary: 151 mg/dL — ABNORMAL HIGH (ref 70–99)
Glucose-Capillary: 212 mg/dL — ABNORMAL HIGH (ref 70–99)
Glucose-Capillary: 238 mg/dL — ABNORMAL HIGH (ref 70–99)

## 2019-09-20 MED ORDER — VANCOMYCIN HCL 10 G IV SOLR
1250.0000 mg | INTRAVENOUS | Status: DC
  Administered 2019-09-20 – 2019-09-21 (×2): 1250 mg via INTRAVENOUS
  Filled 2019-09-20 (×4): qty 1250

## 2019-09-20 NOTE — ED Notes (Signed)
Pt am labs resulted with glucose >1000. Had to redraw cbc to get accurate result. Will redraw CMP and CK to see if labs are more accurate

## 2019-09-20 NOTE — ED Notes (Signed)
Pt was incontinent of urine and bowel; pt cleaned and linens changed. Barrier cream and yeast cream applied. Pt unable to assist with rolling/turning from side to side. Multiple wounds noted, cleaned/covered. Pt has wound care consult in a.m. Purwick applied.

## 2019-09-20 NOTE — ED Notes (Signed)
Arrived into room to see patient sitting up in bed, attempting to get out. Also removed nasal cannula and was pulling at IV. Pt was incontinent of urine.

## 2019-09-20 NOTE — ED Notes (Signed)
TELE 

## 2019-09-20 NOTE — ED Notes (Signed)
Breakfast tray ordered 

## 2019-09-20 NOTE — Progress Notes (Addendum)
PROGRESS NOTE        PATIENT DETAILS Name: Loretta [name] Age: 73 y.o. Sex: female Date of Birth: 02-Oct-1946 Admit Date: 09/19/2019 Admitting Physician Clydie Braun, MD ZOX:[id number], Zollie Beckers, MD  Brief Narrative: Patient is a 73 y.o. female with past medical history of HTN, dyslipidemia, DM-2-who was found down at her independent living facility-she was confused-she was brought to the hospital for further evaluation, where she was found to have hyponatremia, [name] and rhabdomyolysis.  She was also found to have numerous ulcerations in the lower extremity.  See below for further details.  Subjective: Awake/alert-very poor historian-does not recollect exactly when she fell.   Spoke with son over the phone-who is a physician-he thinks she was probably down in her apartment for at least a few days.  Assessment/Plan: Sepsis secondary to UTI and numerous lower extremity ulceration with some surrounding cellulitis: Sepsis pathophysiology is resolving-continue empiric antimicrobial therapy-await culture data.  [name]: Likely hemodynamically mediated-improving with supportive care  Hypernatremia: Secondary to dehydration/poor oral intake-improving slowly-continue hydration with D5W and recheck electrolytes tomorrow.  Rhabdomyolysis: Secondary to patient being down on the floor properly for several days-continue hydration with IV fluids-recheck CK tomorrow.  Acute metabolic encephalopathy: Suspect secondary to [name] and hyponatremia.  CT head without acute abnormalities.  Continue to follow-we will continue to improve with supportive care-if not we can initiate further work-up.  DM-2: CBGs relatively stable-continue SSI  HTN: Stable-continue amlodipine and Bystolic-continue to hold Benicar and Lasix for now.  Dyslipidemia: Hold statins  Chronic lower extremity edema: Hold diuretics given [name].  Per patient's son-patient has longstanding lower extremity edema.  Chronic  debility/deconditioning: Claims to walk with either a walker or cane-Per patient's son-ever since the pandemic broke out-patient has not been participating in her usual swimming/other activities as usual.  Suspect she may have developed deconditioning over the past few months.  Awaiting PT/OT eval-but given that the patient was found down at her independent living facility for several days-suspect she will require SNF on discharge.  Numerous bilateral lower extremity ulceration: All present prior to this hospital stay-wound care following-have consulted vascular surgery (left message for Dr. Arbie Cookey)  Obesity: Estimated body mass index is 42.57 kg/m as calculated from the following:   Height as of this encounter:  (1.727 m).   Weight as of this encounter: 127 kg.     Diet: Diet Order            Diet heart healthy/carb modified Room service appropriate? Yes; Fluid consistency: Thin  Diet effective now               DVT Prophylaxis: Prophylactic Heparin  Code Status: Full code  Family Communication: Son over the phone  Disposition Plan: Remain inpatient-probably will require SNF on discharge  Antimicrobial agents: Anti-infectives (From admission, onward)   Start     Dose/Rate Route Frequency Ordered Stop   09/21/19 1200  vancomycin (VANCOCIN) 1,500 mg in sodium chloride 0.9 % 500 mL IVPB  Status:  Discontinued     1,500 mg 250 mL/hr over 120 Minutes Intravenous Every 48 hours 09/19/19 1110 09/20/19 0907   09/20/19 2230  cefTRIAXone (ROCEPHIN) 1 g in sodium chloride 0.9 % 100 mL IVPB     1 g 200 mL/hr over 30 Minutes Intravenous Every 24 hours 09/19/19 1909     09/20/19 1400  vancomycin (VANCOCIN)  1,250 mg in sodium chloride 0.9 % 250 mL IVPB     1,250 mg 166.7 mL/hr over 90 Minutes Intravenous Every 24 hours 09/20/19 0907     09/19/19 2330  ceFEPIme (MAXIPIME) 2 g in sodium chloride 0.9 % 100 mL IVPB  Status:  Discontinued     2 g 200 mL/hr over 30 Minutes Intravenous  Every 12 hours 09/19/19 1110 09/19/19 1909   09/19/19 2230  ceFEPIme (MAXIPIME) 2 g in sodium chloride 0.9 % 100 mL IVPB     2 g 200 mL/hr over 30 Minutes Intravenous  Once 09/19/19 1909 09/20/19 0005   09/19/19 1030  ceFEPIme (MAXIPIME) 2 g in sodium chloride 0.9 % 100 mL IVPB     2 g 200 mL/hr over 30 Minutes Intravenous  Once 09/19/19 1022 09/19/19 1306   09/19/19 1030  vancomycin (VANCOCIN) 2,000 mg in sodium chloride 0.9 % 500 mL IVPB     2,000 mg 250 mL/hr over 120 Minutes Intravenous  Once 09/19/19 1022 09/19/19 1525   09/19/19 1015  metroNIDAZOLE (FLAGYL) IVPB 500 mg     500 mg 100 mL/hr over 60 Minutes Intravenous  Once 09/19/19 1010 09/19/19 1306      Procedures: None  CONSULTS:  vascular surgery  Time spent: 25- minutes-Greater than 50% of this time was spent in counseling, explanation of diagnosis, planning of further management, and coordination of care.  MEDICATIONS: Scheduled Meds:  amLODipine  5 mg Oral Daily   aspirin EC  81 mg Oral Daily   heparin  5,000 Units Subcutaneous Q8H   insulin aspart  0-15 Units Subcutaneous Q6H   insulin aspart  0-5 Units Subcutaneous QHS   nebivolol  10 mg Oral Daily   nystatin cream   Topical BID   sodium chloride flush  3 mL Intravenous Q12H   Continuous Infusions:  cefTRIAXone (ROCEPHIN)  IV     dextrose 125 mL/hr at 09/20/19 1207   vancomycin     PRN Meds:.acetaminophen **OR** acetaminophen, albuterol, ondansetron **OR** ondansetron (ZOFRAN) IV   PHYSICAL EXAM: Vital signs: Vitals:   09/20/19 1130 09/20/19 1315 09/20/19 1330 09/20/19 1346  BP:    (!) 110/58  Pulse: 84 67 70 74  Resp: (!) 22 18 16 18   Temp:      TempSrc:      SpO2: 93% 93% 94% 94%  Weight:      Height:       Filed Weights   09/19/19 1014  Weight: 127 kg   Body mass index is 42.57 kg/m.   Gen Exam:Alert awake-not in any distress HEENT:atraumatic, normocephalic Chest: B/L clear to auscultation anteriorly CVS:S1S2  regular Abdomen:soft non tender, non distended Extremities:++ edema Neurology: Non focal Skin: no rash        I have personally reviewed following labs and imaging studies  LABORATORY DATA: CBC: Recent Labs  Lab 09/19/19 0855 09/20/19 0600  WBC 15.5* 15.7*  NEUTROABS 13.4*  --   HGB 18.6* 18.3*  HCT 58.8* 59.1*  MCV 91.0 94.9  PLT 186 120*    Basic Metabolic Panel: Recent Labs  Lab 09/19/19 0855 09/19/19 1537 09/20/19 0510 09/20/19 0640  NA 154* 155* 119* 153*  K 4.0 2.7* 2.8* 3.9  CL 114* 123* 91* 118*  CO2 21* 23 15* 18*  GLUCOSE 264* 194* 1,196* 267*  BUN 116* 90* 58* 75*  CREATININE 2.30* 1.52* 1.11* 1.27*  CALCIUM 9.5 7.8* 6.7* 8.8*    GFR: Estimated Creatinine Clearance: 58 mL/min (A) (by C-G formula  based on SCr of 1.27 mg/dL (H)).  Liver Function Tests: Recent Labs  Lab 09/19/19 0855 09/20/19 0510 09/20/19 0640  AST 61* 49* 68*  ALT 36 28 38  ALKPHOS 74 47 66  BILITOT 1.4* 1.4* 2.0*  PROT 7.6 4.8* 6.2*  ALBUMIN 3.5 2.0* 2.6*   No results for input(s): LIPASE, AMYLASE in the last 168 hours. No results for input(s): AMMONIA in the last 168 hours.  Coagulation Profile: No results for input(s): INR, PROTIME in the last 168 hours.  Cardiac Enzymes: Recent Labs  Lab 09/19/19 0855 09/20/19 0510 09/20/19 0640  CKTOTAL 1,495* 1,219* 1,672*    BNP (last 3 results) No results for input(s): PROBNP in the last 8760 hours.  HbA1C: Recent Labs    09/19/19 0855  HGBA1C 6.7*    CBG: Recent Labs  Lab 09/19/19 2204 09/20/19 0054 09/20/19 0548 09/20/19 0833 09/20/19 1151  GLUCAP 212* 210* 239* 277* 160*    Lipid Profile: No results for input(s): CHOL, HDL, LDLCALC, TRIG, CHOLHDL, LDLDIRECT in the last 72 hours.  Thyroid Function Tests: No results for input(s): TSH, T4TOTAL, FREET4, T3FREE, THYROIDAB in the last 72 hours.  Anemia Panel: No results for input(s): VITAMINB12, FOLATE, FERRITIN, TIBC, IRON, RETICCTPCT in the last  72 hours.  Urine analysis:    Component Value Date/Time   COLORURINE YELLOW 09/19/2019 1200   APPEARANCEUR CLOUDY (A) 09/19/2019 1200   LABSPEC 1.018 09/19/2019 1200   PHURINE 5.0 09/19/2019 1200   GLUCOSEU 150 (A) 09/19/2019 1200   HGBUR MODERATE (A) 09/19/2019 1200   BILIRUBINUR NEGATIVE 09/19/2019 1200   KETONESUR NEGATIVE 09/19/2019 1200   PROTEINUR 100 (A) 09/19/2019 1200   NITRITE NEGATIVE 09/19/2019 1200   LEUKOCYTESUR LARGE (A) 09/19/2019 1200    Sepsis Labs: Lactic Acid, Venous    Component Value Date/Time   LATICACIDVEN 1.7 09/19/2019 1537    MICROBIOLOGY: Recent Results (from the past 240 hour(s))  Blood culture (routine x 2)     Status: None (Preliminary result)   Collection Time: 09/19/19  8:56 AM   Specimen: BLOOD RIGHT HAND  Result Value Ref Range Status   Specimen Description BLOOD RIGHT HAND  Final   Special Requests   Final    BOTTLES DRAWN AEROBIC AND ANAEROBIC Blood Culture results may not be optimal due to an inadequate volume of blood received in culture bottles   Culture   Final    NO GROWTH < 24 HOURS Performed at [facility], [address] [place], [place] [address]    Report Status PENDING  Incomplete  SARS Coronavirus 2 [facility](Hospital order, Performed in [facility] hospital lab) Nasopharyngeal Nasopharyngeal Swab     Status: None   Collection Time: 09/19/19  9:06 AM   Specimen: Nasopharyngeal Swab  Result Value Ref Range Status   SARS Coronavirus 2 NEGATIVE NEGATIVE Final    Comment: (NOTE) If result is NEGATIVE SARS-CoV-2 target nucleic acids are NOT DETECTED. The SARS-CoV-2 RNA is generally detectable in upper and lower  respiratory specimens during the acute phase of infection. The lowest  concentration of SARS-CoV-2 viral copies this assay can detect is 250  copies / mL. A negative result does not preclude SARS-CoV-2 infection  and should not be used as the sole basis for treatment or other  patient management decisions.  A  negative result may occur with  improper specimen collection / handling, submission of specimen other  than nasopharyngeal swab, presence of viral mutation(s) within the  areas targeted by this assay,  and inadequate number of viral copies  (<250 copies / mL). A negative result must be combined with clinical  observations, patient history, and epidemiological information. If result is POSITIVE SARS-CoV-2 target nucleic acids are DETECTED. The SARS-CoV-2 RNA is generally detectable in upper and lower  respiratory specimens dur ing the acute phase of infection.  Positive  results are indicative of active infection with SARS-CoV-2.  Clinical  correlation with patient history and other diagnostic information is  necessary to determine patient infection status.  Positive results do  not rule out bacterial infection or co-infection with other viruses. If result is PRESUMPTIVE POSTIVE SARS-CoV-2 nucleic acids MAY BE PRESENT.   A presumptive positive result was obtained on the submitted specimen  and confirmed on repeat testing.  While 2019 novel coronavirus  (SARS-CoV-2) nucleic acids may be present in the submitted sample  additional confirmatory testing may be necessary for epidemiological  and / or clinical management purposes  to differentiate between  SARS-CoV-2 and other Sarbecovirus currently known to infect humans.  If clinically indicated additional testing with an alternate test  methodology [phone]) is advised. The SARS-CoV-2 RNA is generally  detectable in upper and lower respiratory sp ecimens during the acute  phase of infection. The expected result is Negative. Fact Sheet for Patients:  BoilerBrush.com.cy Fact Sheet for Healthcare Providers: https://pope.com/ This test is not yet approved or cleared by the [place] FDA and has been authorized for detection and/or diagnosis of SARS-CoV-2 by FDA under an Emergency Use  Authorization (EUA).  This EUA will remain in effect (meaning this test can be used) for the duration of the COVID-19 declaration under Section 564(b)(1) of the Act, 21 U.S.C. section 360bbb-3(b)(1), unless the authorization is terminated or revoked sooner. Performed at [facility], [address], [place], [place] [address]   Blood culture (routine x 2)     Status: None (Preliminary result)   Collection Time: 09/19/19 11:42 AM   Specimen: BLOOD  Result Value Ref Range Status   Specimen Description BLOOD LEFT ANTECUBITAL  Final   Special Requests   Final    BOTTLES DRAWN AEROBIC ONLY Blood Culture results may not be optimal due to an inadequate volume of blood received in culture bottles   Culture   Final    NO GROWTH < 24 HOURS Performed at [facility], [address] [place], [place] [address]    Report Status PENDING  Incomplete  Urine culture     Status: Abnormal   Collection Time: 09/19/19 12:15 PM   Specimen: Urine, Clean Catch  Result Value Ref Range Status   Specimen Description URINE, CLEAN CATCH  Final   Special Requests   Final    NONE Performed at [facility], [address], [place], [place] [address]    Culture MULTIPLE SPECIES PRESENT, SUGGEST RECOLLECTION (A)  Final   Report Status 09/20/2019 FINAL  Final    RADIOLOGY STUDIES/RESULTS: Dg Pelvis 1-2 Views  Result Date: 09/19/2019 CLINICAL DATA:  Pain after fall EXAM: PELVIS - 1-2 VIEW COMPARISON:  None. FINDINGS: There is no evidence of pelvic fracture or diastasis. No pelvic bone lesions are seen. IMPRESSION: Negative. Electronically Signed   By: Gerome Sam III M.D   On: 09/19/2019 10:22   Ct Head Wo Contrast  Result Date: 09/19/2019 CLINICAL DATA:  Headache. EXAM: CT HEAD WITHOUT CONTRAST CT CERVICAL SPINE WITHOUT CONTRAST TECHNIQUE: Multidetector CT imaging of the head and cervical spine was performed following the standard  protocol without intravenous contrast. Multiplanar CT image  reconstructions of the cervical spine were also generated. COMPARISON:  None. FINDINGS: CT HEAD FINDINGS Brain: No evidence of acute infarction, hemorrhage, hydrocephalus, extra-axial collection or mass lesion/mass effect. There is mild diffuse low-attenuation within the subcortical and periventricular white matter compatible with chronic microvascular disease. Enlargement of the sulci and ventricles compatible with brain atrophy. Vascular: No hyperdense vessel or unexpected calcification. Skull: Normal. Negative for fracture or focal lesion. Sinuses/Orbits: No acute finding. Other: None. CT CERVICAL SPINE FINDINGS Alignment: Normal. Skull base and vertebrae: No acute fracture. No primary bone lesion or focal pathologic process. Soft tissues and spinal canal: No prevertebral fluid or swelling. No visible canal hematoma. Disc levels: Marked multi level disc space narrowing and endplate spurring. Upper chest: Negative. Other: None IMPRESSION: 1. No acute intracranial abnormality. 2. Chronic small vessel ischemic change and brain atrophy. 3. No evidence for cervical spine fracture. 4. Cervical degenerative disc disease. Electronically Signed   By: Kerby Moors M.D.   On: 09/19/2019 09:52   Ct Cervical Spine Wo Contrast  Result Date: 09/19/2019 CLINICAL DATA:  Headache. EXAM: CT HEAD WITHOUT CONTRAST CT CERVICAL SPINE WITHOUT CONTRAST TECHNIQUE: Multidetector CT imaging of the head and cervical spine was performed following the standard protocol without intravenous contrast. Multiplanar CT image reconstructions of the cervical spine were also generated. COMPARISON:  None. FINDINGS: CT HEAD FINDINGS Brain: No evidence of acute infarction, hemorrhage, hydrocephalus, extra-axial collection or mass lesion/mass effect. There is mild diffuse low-attenuation within the subcortical and periventricular white matter compatible with chronic microvascular disease. Enlargement of the sulci and ventricles compatible with brain  atrophy. Vascular: No hyperdense vessel or unexpected calcification. Skull: Normal. Negative for fracture or focal lesion. Sinuses/Orbits: No acute finding. Other: None. CT CERVICAL SPINE FINDINGS Alignment: Normal. Skull base and vertebrae: No acute fracture. No primary bone lesion or focal pathologic process. Soft tissues and spinal canal: No prevertebral fluid or swelling. No visible canal hematoma. Disc levels: Marked multi level disc space narrowing and endplate spurring. Upper chest: Negative. Other: None IMPRESSION: 1. No acute intracranial abnormality. 2. Chronic small vessel ischemic change and brain atrophy. 3. No evidence for cervical spine fracture. 4. Cervical degenerative disc disease. Electronically Signed   By: Kerby Moors M.D.   On: 09/19/2019 09:52   Dg Chest Portable 1 View  Result Date: 09/19/2019 CLINICAL DATA:  Pain after fall EXAM: PORTABLE CHEST 1 VIEW COMPARISON:  None. FINDINGS: There is a tortuous thoracic aorta. The heart, hila, mediastinum, lungs, and pleura are otherwise unremarkable. IMPRESSION: No active disease. Electronically Signed   By: Dorise Bullion III M.D   On: 09/19/2019 10:33   Dg Tibia/fibula Left Port  Result Date: 09/19/2019 CLINICAL DATA:  Pain after fall EXAM: PORTABLE LEFT TIBIA AND FIBULA - 2 VIEW COMPARISON:  None. FINDINGS: Degenerative changes in the knee.  No fractures. IMPRESSION: No fractures. Electronically Signed   By: Dorise Bullion III M.D   On: 09/19/2019 10:23   Dg Hand Complete Right  Result Date: 09/19/2019 CLINICAL DATA:  Pain after fall EXAM: RIGHT HAND - COMPLETE 3+ VIEW COMPARISON:  None. FINDINGS: Known usual configuration of the distal radius is likely due to remote healed trauma. No acute fracture noted. IMPRESSION: No acute fractures are seen. Electronically Signed   By: Dorise Bullion III M.D   On: 09/19/2019 10:25   Dg Foot Complete Left  Result Date: 09/19/2019 CLINICAL DATA:  Pain after fall EXAM: LEFT FOOT - COMPLETE 3+  VIEW  COMPARISON:  None. FINDINGS: There is no evidence of fracture or dislocation. There is no evidence of arthropathy or other focal bone abnormality. Soft tissues are unremarkable. IMPRESSION: Negative. Electronically Signed   By: Gerome Sam III M.D   On: 09/19/2019 10:28   Dg Foot Complete Right  Result Date: 09/19/2019 CLINICAL DATA:  Pain after fall EXAM: RIGHT FOOT COMPLETE - 3+ VIEW COMPARISON:  None. FINDINGS: Severe soft tissue swelling in the foot, particularly along the top of the foot. No fractures, dislocations, or bony erosion. IMPRESSION: Soft tissue swelling.  No other acute abnormalities. Electronically Signed   By: Gerome Sam III M.D   On: 09/19/2019 10:26   Dg Femur Portable 1 View Left  Result Date: 09/19/2019 CLINICAL DATA:  Pain after fall EXAM: LEFT FEMUR PORTABLE 1 VIEW COMPARISON:  None. FINDINGS: Degenerative changes are seen in the knee.  No fractures are seen. IMPRESSION: Negative. Electronically Signed   By: Gerome Sam III M.D   On: 09/19/2019 10:32   Dg Femur Portable 1 View Right  Result Date: 09/19/2019 CLINICAL DATA:  Pain after fall EXAM: RIGHT FEMUR PORTABLE 1 VIEW COMPARISON:  None. FINDINGS: Degenerative changes in the knee.  No fractures seen in the femur. IMPRESSION: No fractures identified. Electronically Signed   By: Gerome Sam III M.D   On: 09/19/2019 10:30     LOS: 1 day   Jeoffrey Massed, MD  [facility]  If 7PM-7AM, please contact night-coverage  Please page via [email or web address]  Go to [email or web address] and use [facility] universal password to access. If you do not have the password, please contact the hospital operator.  Locate the [facility] provider you are looking for under [facility] and page to a number that you can be directly reached. If you still have difficulty reaching the provider, please page the [facility] (Director on Call) for the Hospitalists listed on amion for assistance.  09/20/2019, 1:58 PM

## 2019-09-20 NOTE — ED Notes (Signed)
Pt placed on room air because she was satting mid 90's on 2L. Pt maintained sats until she fell asleep then she desatted to the 70's so pt is back on 2L

## 2019-09-20 NOTE — Progress Notes (Signed)
Pharmacy Antibiotic Note  Loretta [name] is a 73 y.o. female admitted on 09/19/2019 with sepsis.  Pharmacy has been consulted for vancomycin dosing. Pt is remains afebrile but WBC is elevated. Scr has improved dramatically to 1.27 and lactic acid has normalized.    Plan: Change vancomycin to 1250mg  IV Q24H F/u renal fxn, C&S, clinical status and peak/trough at [facility] De-escalate as able  Height: 5\' 8"  (172.7 cm) Weight: 280 lb (127 kg)(bed scale) IBW/kg (Calculated) : 63.9  Temp (24hrs), Avg:97.7 F (36.5 C), Min:97.7 F (36.5 C), Max:97.7 F (36.5 C)  Recent Labs  Lab 09/19/19 0855 09/19/19 1537 09/20/19 0510 09/20/19 0600 09/20/19 0640  WBC 15.5*  --   --  15.7*  --   CREATININE 2.30* 1.52* 1.11*  --  1.27*  LATICACIDVEN 3.3* 1.7  --   --   --     Estimated Creatinine Clearance: 58 mL/min (A) (by C-G formula based on SCr of 1.27 mg/dL (H)).    Allergies  Allergen Reactions  . Pravastatin Other (See Comments)    Caused leg paralysis     Antimicrobials this admission: Vanc 9/29>> Cefepime 9/29>>9/30 CTX 9/30>> Flagyl x 1 9/29  Microbiology results: 9/29 Blood - NGTD 9/29 COVID - NEG  Thank you for allowing pharmacy to be a part of this patient's care.  Donnarae Rae, Rande Lawman 09/20/2019 9:08 AM

## 2019-09-20 NOTE — Consult Note (Signed)
[facility] [place]  Patient name: Loretta [name] MRN: [id number] DOB: 11/05/1946 Sex: female   REQUESTING PROVIDER:    Hospitalists   REASON FOR CONSULT:    Leg ulcers  HISTORY OF PRESENT ILLNESS:   Loretta [name] is a 73 y.o. female, who was brought to the ER on 09/19/2019 after being found down with altered mental status.  She was found on the floor covered in stool and urine.  Her son reported that she may have been down for 3 days based on the last conversation.  The patient is on the spectrum and likes to be isolated.  In the ER, she was in acute renal failure with a creatinine of 2.3.  Her CK levels were 1490 and her lactate was 3.3.  She had a UTI.  She was started on empiric antibiotics.  She was found to have bilateral ulcers and ischemic changes to her toes on her left foot so vascular was consulted.  She was diagnosed with sepsis and started on empiric antibiotic therapy.  The patient has a history of hypertension.  She takes a statin for hypercholesterolemia.  She is a diabetic with hemoglobin A1c of 6.7.  PAST MEDICAL HISTORY    Past Medical History:  Diagnosis Date  . Diabetes mellitus without complication (HCC)    Type  . Hyperlipidemia   . Hypertension      FAMILY HISTORY   Family History  Problem Relation Age of Onset  . Congestive Heart Failure Mother   . Diabetes Mellitus II Father   . Osteoporosis Neg Hx     SOCIAL HISTORY:   Social History   Socioeconomic History  . Marital status: Single    Spouse name: Not on file  . Number of children: Not on file  . Years of education: Not on file  . Highest education level: Not on file  Occupational History  . Not on file  Social Needs  . Financial resource strain: Not on file  . Food insecurity    Worry: Not on file    Inability: Not on file  . Transportation needs    Medical: Not on file    Non-medical: Not on file  Tobacco Use  .  Smoking status: Never Smoker  . Smokeless tobacco: Never Used  Substance and Sexual Activity  . Alcohol use: No  . Drug use: No  . Sexual activity: Not on file  Lifestyle  . Physical activity    Days per week: Not on file    Minutes per session: Not on file  . Stress: Not on file  Relationships  . Social Herbalist on phone: Not on file    Gets together: Not on file    Attends religious service: Not on file    Active member of club or organization: Not on file    Attends meetings of clubs or organizations: Not on file    Relationship status: Not on file  . Intimate partner violence    Fear of current or ex partner: Not on file    Emotionally abused: Not on file    Physically abused: Not on file    Forced sexual activity: Not on file  Other Topics Concern  . Not on file  Social History Narrative  . Not on file    ALLERGIES:    Allergies  Allergen Reactions  . Pravastatin Other (See Comments)    Caused leg paralysis     CURRENT  MEDICATIONS:    Current Facility-Administered Medications  Medication Dose Route Frequency Provider Last Rate Last Dose  . acetaminophen (TYLENOL) tablet 650 mg  650 mg Oral Q6H PRN Clydie BraunSmith, Rondell A, MD       Or  . acetaminophen (TYLENOL) suppository 650 mg  650 mg Rectal Q6H PRN Smith, Rondell A, MD      . albuterol (PROVENTIL) (2.5 MG/3ML) 0.083% nebulizer solution 2.5 mg  2.5 mg Nebulization Q6H PRN Smith, Rondell A, MD      . amLODipine (NORVASC) tablet 5 mg  5 mg Oral Daily Katrinka BlazingSmith, Rondell A, MD   5 mg at 09/20/19 0939  . aspirin EC tablet 81 mg  81 mg Oral Daily Madelyn FlavorsSmith, Rondell A, MD   81 mg at 09/20/19 0939  . cefTRIAXone (ROCEPHIN) 1 g in sodium chloride 0.9 % 100 mL IVPB  1 g Intravenous Q24H Smith, Rondell A, MD      . dextrose 5 % solution   Intravenous Continuous Madelyn FlavorsSmith, Rondell A, MD 125 mL/hr at 09/20/19 1207    . heparin injection 5,000 Units  5,000 Units Subcutaneous Q8H Nwogu, Ivy A, RPH   5,000 Units at 09/20/19 1421   . insulin aspart (novoLOG) injection 0-15 Units  0-15 Units Subcutaneous Q6H Madelyn FlavorsSmith, Rondell A, MD   5 Units at 09/20/19 2029  . insulin aspart (novoLOG) injection 0-5 Units  0-5 Units Subcutaneous QHS Clydie BraunSmith, Rondell A, MD   2 Units at 09/19/19 2254  . nebivolol (BYSTOLIC) tablet 10 mg  10 mg Oral Daily Madelyn FlavorsSmith, Rondell A, MD   10 mg at 09/20/19 0940  . nystatin cream (MYCOSTATIN)   Topical BID Tu, Ching T, DO      . ondansetron (ZOFRAN) tablet 4 mg  4 mg Oral Q6H PRN Madelyn FlavorsSmith, Rondell A, MD       Or  . ondansetron (ZOFRAN) injection 4 mg  4 mg Intravenous Q6H PRN Smith, Rondell A, MD      . sodium chloride flush (NS) 0.9 % injection 3 mL  3 mL Intravenous Q12H Smith, Rondell A, MD   3 mL at 09/19/19 2255  . vancomycin (VANCOCIN) 1,250 mg in sodium chloride 0.9 % 250 mL IVPB  1,250 mg Intravenous Q24H Rumbarger, Faye RamsayRachel L, RPH 166.7 mL/hr at 09/20/19 1437 1,250 mg at 09/20/19 1437    REVIEW OF SYSTEMS:   [X]  denotes positive finding, [ ]  denotes negative finding Cardiac  Comments:  Chest pain or chest pressure:    Shortness of breath upon exertion:    Short of breath when lying flat:    Irregular heart rhythm:        Vascular    Pain in calf, thigh, or hip brought on by ambulation:    Pain in feet at night that wakes you up from your sleep:     Blood clot in your veins:    Leg swelling:  x       Pulmonary    Oxygen at home:    Productive cough:     Wheezing:         Neurologic    Sudden weakness in arms or legs:     Sudden numbness in arms or legs:     Sudden onset of difficulty speaking or slurred speech:    Temporary loss of vision in one eye:     Problems with dizziness:         Gastrointestinal    Blood in stool:      Vomited blood:  Genitourinary    Burning when urinating:     Blood in urine:        Psychiatric    Major depression:         Hematologic    Bleeding problems:    Problems with blood clotting too easily:        Skin    Rashes or ulcers: x        Constitutional    Fever or chills:     PHYSICAL EXAM:   Vitals:   09/20/19 1315 09/20/19 1330 09/20/19 1346 09/20/19 1500  BP:   (!) 110/58 112/69  Pulse: 67 70 74 77  Resp: 18 16 18 17   Temp:    99 F (37.2 C)  TempSrc:    Oral  SpO2: 93% 94% 94% 96%  Weight:      Height:        GENERAL: The patient is a well-nourished female, in no acute distress. The vital signs are documented above. CARDIAC: There is a regular rate and rhythm.  VASCULAR: Palpable left dorsalis pedis pulse I could not palpate a right.  She has significant lower extremity edema. PULMONARY: Nonlabored respirations ABDOMEN: Soft and non-tender with normal pitched bowel sounds.  MUSCULOSKELETAL: There are no major deformities or cyanosis. NEUROLOGIC: No focal weakness or paresthesias are detected. SKIN: See photo of.  Left leg ulcer PSYCHIATRIC: The patient has a normal affect.        STUDIES:   ABIs have been ordered  ASSESSMENT and PLAN   Bilateral lower extremity ulcers with gangrenous changes to the left third fourth and fifth toes: I was able to palpate a left dorsalis pedis pulse.  I am obtaining ABIs to better evaluate her blood flow, including toe pressures.  We discussed that she is going to need amputation of her left third fourth and fifth toes however I would continue with resuscitation at this time along with IV antibiotics.  I will need to make a decision whether or not she will need angiography prior to amputation, however currently she is in acute renal failure so this will need to resolve before angiography.  I will continue to follow her on a daily basis.   , MD, FACS [facility] [phone] Pager [phone]

## 2019-09-20 NOTE — Evaluation (Signed)
Clinical/Bedside Swallow Evaluation Patient Details  Name: Loretta [name] MRN: [id number] Date of Birth: May 18, 1946  Today's Date: 09/20/2019 Time: SLP Start Time (ACUTE ONLY): 1620 SLP Stop Time (ACUTE ONLY): 1640 SLP Time Calculation (min) (ACUTE ONLY): 20 min  Past Medical History:  Past Medical History:  Diagnosis Date  . Diabetes mellitus without complication (HCC)    Type  . Hyperlipidemia   . Hypertension    Past Surgical History:  Past Surgical History:  Procedure Laterality Date  . ORIF ANKLE FRACTURE Right 01/04/2017   Procedure: OPEN REDUCTION INTERNAL FIXATION (ORIF) RIGHT ANKLE FRACTURE;  Surgeon: Eldred Manges, MD;  Location: [facility] OR;  Service: Orthopedics;  Laterality: Right;   HPI:  Loretta [name] is a 73 y.o. female with medical history significant of hypertension, hyperlipidemia, and diabetes mellitus type 2; who presents after being found down down with altered mental status.  History is limited as patient is currently altered she has been living in independent living and the patient son had last communicated with her by email on 9/24.  Normally they will talk once or twice a week over the phone.  However, when the patient did not pick up the phone recently he called the facility to do a wellness check today.  They found the patient on the floor covered in stool and urine. Unclear if she fell or slide out of bed. Son reports that his mother is on the spectrum and likes to be isolated.  She has fired prior physical therapist and home health aids.  She does complain of pain in her legs back and neck.  Son would like the patient to go to some kind of rehab or skilled nursing facility at discharge.   Assessment / Plan / Recommendation Clinical Impression  Pt appears at reduced risk of aspiration when consuming regular diet with thin liquids via straw. During this bedside swallow evaluation, pt fed herself regular snack with thin liquids wihtout overt s/s of dysphagia or  aspiration. At this time, ST will sign off.  SLP Visit Diagnosis: Dysphagia, unspecified (R13.10)    Aspiration Risk  No limitations    Diet Recommendation Regular;Thin liquid   Liquid Administration via: Straw Medication Administration: Whole meds with liquid Supervision: Patient able to self feed Compensations: Minimize environmental distractions;Slow rate;Small sips/bites Postural Changes: Seated upright at 90 degrees    Other  Recommendations Oral Care Recommendations: Oral care BID   Follow up Recommendations None      Frequency and Duration   N/A         Prognosis   N/A     Swallow Study   General Date of Onset: 09/19/19 HPI: Loretta [name] is a 73 y.o. female with medical history significant of hypertension, hyperlipidemia, and diabetes mellitus type 2; who presents after being found down down with altered mental status.  History is limited as patient is currently altered she has been living in independent living and the patient son had last communicated with her by email on 9/24.  Normally they will talk once or twice a week over the phone.  However, when the patient did not pick up the phone recently he called the facility to do a wellness check today.  They found the patient on the floor covered in stool and urine. Unclear if she fell or slide out of bed. Son reports that his mother is on the spectrum and likes to be isolated.  She has fired prior physical therapist and home health aids.  She does complain of pain in her legs back and neck.  Son would like the patient to go to some kind of rehab or skilled nursing facility at discharge. Type of Study: Bedside Swallow Evaluation Previous Swallow Assessment: none in chart Diet Prior to this Study: Regular;Thin liquids Temperature Spikes Noted: No Respiratory Status: Room air History of Recent Intubation: No Behavior/Cognition: Alert;Cooperative;Pleasant mood Oral Cavity Assessment: Within Functional Limits Oral Care  Completed by SLP: No Oral Cavity - Dentition: Adequate natural dentition Vision: Functional for self-feeding Self-Feeding Abilities: Able to feed self Patient Positioning: Upright in bed Baseline Vocal Quality: Normal Volitional Cough: Strong Volitional Swallow: Able to elicit    Oral/Motor/Sensory Function Overall Oral Motor/Sensory Function: Within functional limits   Ice Chips Ice chips: Not tested   Thin Liquid Thin Liquid: Within functional limits Presentation: Self Fed;Straw    Nectar Thick Nectar Thick Liquid: Not tested   Honey Thick Honey Thick Liquid: Not tested   Puree Puree: Within functional limits Presentation: Self Fed;Spoon   Solid     Solid: Within functional limits Presentation: [place] 09/20/2019,4:53 PM

## 2019-09-20 NOTE — Consult Note (Signed)
[facility] Nurse wound consult note Reason for Consult: Patient seen in the ED.  The assistance of the Bedside RN is appreciated as patient is not able to participate in assessment. Dr. Sloan Leiter has uploaded two digital photographs of the most problematic wounds this am. Those are appreciated.  Additionally, I will suggest the consideration of a vascular consultation due to the complex nature of the LE wounds Wound type: infectious, venous insufficiency, trauma Pressure Injury POA: Yes (left lateral malleolus) Unstageable Measurement:  Right LE at the anterior aspect, just distal to knee: 7cm x 7cm area of ruptured and intact blistering, three (3) wounds total. Intact bullae measures 3cm x 2cm and is elevated 1.5cm Right medial thigh: 9cm x 6cm x 0.1cm area of partial thickness skin loss consistent with scratching (excoriation) Right medial malleolus: 5cm x 7cm area consisting of two ruptured bullae with ecchymosis noted Left foot, digits 3-5:  Purple/black with serosanguinous exudate from 5th digit, lateral aspect.  Left lateral malleolus: 9cm x 6cm  Unstageable pressure injury with thin brown nonviable tissue. Left foot, great toe and medial midfoot: two areas of white fluid accumulation beneath skin, the larger of the two measures 2cm x 3.4cm. Left LE, posterior aspect:  77mc x 6cm x 0.1cm site of ruptured bullae. No exudate, rolled epidermis at wound periphery.  Wound bed: As noted above Drainage (amount, consistency, odor) As noted above Periwound: dry, intact Dressing procedure/placement/frequency: I have implemented conservative topical care orders using twice daily nonadherent and antimicrobial dressings (xeroform gauze) topped with dry gauze and secured with Kerlix roll gauze/paper tape. I have provided bilateral pressure redistribution heel boots to offload the heels and to correct the lateral rotation that has become problematic at the left lateral malleolus and the traumatic injuries to the left  digits. A mattress replacement with low air loss feature initially ordered last evening, is again ordered this morning as patient is still holding in the ED. A sacral prophylactic dressing is ordered to reduce the likelihood of a sacral pressure injury.  [place] nursing team will not follow, but will remain available to this patient, the nursing and medical teams.  Please re-consult if needed. Thanks, Maudie Flakes, MSN, RN, [place], Arther Abbott  Pager# [phone]

## 2019-09-20 NOTE — Progress Notes (Addendum)
NEW ADMISSION NOTE New Admission Note:   Arrival Method: Patient arrived from ED on stretcher. Mental Orientation:  Alert to self and place. Telemetry: 57M-11, NSR Assessment: Completed Skin:  Right LE at the anterior aspect, just distal to knee: 7cm x 7cm area of ruptured and intact blistering, three (3) wounds total. Intact bullae measures 3cm x 2cm and is elevated 1.5cm Left medial thigh: 9cm x 6cm x 0.1cm area of partial thickness skin loss consistent with scratching (excoriation) Right medial malleolus: 5cm x 7cm area consisting of two ruptured bullae with ecchymosis noted Left foot, digits 3-5:  Purple/black with serosanguinous exudate from 5th digit, lateral aspect.  Left lateral malleolus: 9cm x 6cm  Unstageable pressure injury with thin brown nonviable tissue. Left foot, great toe and medial midfoot: two areas of white fluid accumulation beneath skin, the larger of the two measures 2cm x 3.4cm. Left LE, posterior aspect:  24mc x 6cm x 0.1cm site of ruptured bullae. No exudate, rolled epidermis at wound periphery. IV: Left wrist D5 @125 . Pain:  Denies any pain currently. Tubes: [place] Safety Measures: Safety Fall Prevention Plan has been given, discussed and signed, needs reinforcement. Admission: In process.  [facility] Orientation: Patient has been orientated to the room, unit and staff, but needs reinforcement.    Orders have been reviewed and implemented. Will continue to monitor the patient. Call light has been placed within reach and bed alarm has been activated.   Amaryllis Dyke, RN

## 2019-09-20 NOTE — ED Notes (Signed)
Breakfast Ordered 

## 2019-09-21 ENCOUNTER — Inpatient Hospital Stay (HOSPITAL_COMMUNITY): Payer: Medicare Other

## 2019-09-21 DIAGNOSIS — L8990 Pressure ulcer of unspecified site, unspecified stage: Secondary | ICD-10-CM | POA: Insufficient documentation

## 2019-09-21 DIAGNOSIS — L0390 Cellulitis, unspecified: Secondary | ICD-10-CM

## 2019-09-21 LAB — GLUCOSE, CAPILLARY
Glucose-Capillary: 188 mg/dL — ABNORMAL HIGH (ref 70–99)
Glucose-Capillary: 201 mg/dL — ABNORMAL HIGH (ref 70–99)
Glucose-Capillary: 205 mg/dL — ABNORMAL HIGH (ref 70–99)
Glucose-Capillary: 215 mg/dL — ABNORMAL HIGH (ref 70–99)
Glucose-Capillary: 217 mg/dL — ABNORMAL HIGH (ref 70–99)
Glucose-Capillary: 226 mg/dL — ABNORMAL HIGH (ref 70–99)

## 2019-09-21 LAB — CBC
HCT: 50.4 % — ABNORMAL HIGH (ref 36.0–46.0)
Hemoglobin: 16.4 g/dL — ABNORMAL HIGH (ref 12.0–15.0)
MCH: 28.9 pg (ref 26.0–34.0)
MCHC: 32.5 g/dL (ref 30.0–36.0)
MCV: 88.9 fL (ref 80.0–100.0)
Platelets: 148 10*3/uL — ABNORMAL LOW (ref 150–400)
RBC: 5.67 MIL/uL — ABNORMAL HIGH (ref 3.87–5.11)
RDW: 14.1 % (ref 11.5–15.5)
WBC: 12 10*3/uL — ABNORMAL HIGH (ref 4.0–10.5)
nRBC: 0 % (ref 0.0–0.2)

## 2019-09-21 LAB — COMPREHENSIVE METABOLIC PANEL
ALT: 33 U/L (ref 0–44)
AST: 43 U/L — ABNORMAL HIGH (ref 15–41)
Albumin: 2.3 g/dL — ABNORMAL LOW (ref 3.5–5.0)
Alkaline Phosphatase: 60 U/L (ref 38–126)
Anion gap: 13 (ref 5–15)
BUN: 51 mg/dL — ABNORMAL HIGH (ref 8–23)
CO2: 20 mmol/L — ABNORMAL LOW (ref 22–32)
Calcium: 8.5 mg/dL — ABNORMAL LOW (ref 8.9–10.3)
Chloride: 112 mmol/L — ABNORMAL HIGH (ref 98–111)
Creatinine, Ser: 1.15 mg/dL — ABNORMAL HIGH (ref 0.44–1.00)
GFR calc Af Amer: 56 mL/min — ABNORMAL LOW (ref >60)
GFR calc non Af Amer: 48 mL/min — ABNORMAL LOW (ref >60)
Glucose, Bld: 230 mg/dL — ABNORMAL HIGH (ref 70–99)
Potassium: 3.4 mmol/L — ABNORMAL LOW (ref 3.5–5.1)
Sodium: 145 mmol/L (ref 135–145)
Total Bilirubin: 1.5 mg/dL — ABNORMAL HIGH (ref 0.3–1.2)
Total Protein: 5.6 g/dL — ABNORMAL LOW (ref 6.5–8.1)

## 2019-09-21 LAB — MAGNESIUM: Magnesium: 2.5 mg/dL — ABNORMAL HIGH (ref 1.7–2.4)

## 2019-09-21 NOTE — Progress Notes (Signed)
Inpatient Diabetes Program Recommendations  AACE/ADA: New Consensus Statement on Inpatient Glycemic Control (2015)  Target Ranges:  Prepandial:   less than 140 mg/dL      Peak postprandial:   less than 180 mg/dL (1-2 hours)      Critically ill patients:  140 - 180 mg/dL   Lab Results  Component Value Date   GLUCAP 226 (H) 09/21/2019   HGBA1C 6.7 (H) 09/19/2019    Review of Glycemic Control  Diabetes history: DM2 Outpatient Diabetes medications: Xigduo 09/999 mg QD Current orders for Inpatient glycemic control: Novolog 0-15 units Q6H and 0-5 units QHS  HgbA1C - 6.7% FBS this am - 230 mg/dL  Inpatient Diabetes Program Recommendations:     Add Lantus 12 units Q24H.  Will follow closely.  Thank you. Lorenda Peck, RD, LDN, CDE Inpatient Diabetes Coordinator [phone]

## 2019-09-21 NOTE — Progress Notes (Signed)
PROGRESS NOTE        PATIENT DETAILS Name: Loretta DibbleCarol A [name] Age: 73 y.o. Sex: female Date of Birth: February 29, 1946 Admit Date: 09/19/2019 Admitting Physician Clydie Braunondell A Smith, MD WNU:UVOZDPCP:Kohut, Zollie BeckersWalter, MD  Brief Narrative: Patient is a 73 y.o. female with past medical history of HTN, dyslipidemia, DM-2-who was found down at her independent living facility-she was confused-she was brought to the hospital for further evaluation, where she was found to have hyponatremia, AKI and rhabdomyolysis.  She was also found to have numerous ulcerations in the lower extremity.  See below for further details.  Subjective: Awake/alert-very poor historian-does not recollect exactly when she fell.   Spoke with son over the phone-who is a physician-he thinks she was probably down in her apartment for at least a few days.  Assessment/Plan: Sepsis secondary to UTI and numerous lower extremity ulceration with some surrounding cellulitis: Sepsis pathophysiology is resolving-urine culture shows polymicrobial growth-blood cultures are negative.  Vascular surgery following with recommendations to proceed with amputation of the left third, fourth and fifth toes.  We will on continuing empiric antimicrobial therapy-24 hours post amputation before discontinuing.    AKI: Likely hemodynamically mediated-continues to improve with supportive care.  Hypernatremia: Secondary to dehydration/poor oral intake-sodium has essentially normalized.  Will start weaning off D5W.  Follow electrolytes.  Rhabdomyolysis: Secondary to patient being down on the floor properly for several days-continue gentle hydration-recheck CK tomorrow.  Acute metabolic encephalopathy: Suspect secondary to AKI and hyponatremia.  CT head without acute abnormalities.  Slowly improving-seems more spontaneous to answers today compared to yesterday.    Numerous bilateral lower extremity ulceration-including gangrenous changes to left third,  fourth and fifth toes: Vascular surgery evaluation in progress-tentative plans for amputation of the third, fourth and fifth toes tomorrow.   DM-2: CBGs relatively stable-continue SSI  HTN: Stable-continue amlodipine and Bystolic-continue to hold Benicar and Lasix for now.  Dyslipidemia: Hold statins  Chronic lower extremity edema: Hold diuretics given AKI.  Per patient's son-patient has longstanding lower extremity edema.  Chronic debility/deconditioning: Claims to walk with either a walker or cane-Per patient's son-ever since the pandemic broke out-patient has not been participating in her usual swimming/other activities as usual.  Suspect she may have developed deconditioning over the past few months.  Awaiting PT/OT eval-but given that the patient was found down at her independent living facility for several days-suspect she will require SNF on discharge.  Numerous bilateral lower extremity ulceration: All present prior to this hospital stay-wound care following-have consulted vascular surgery (left message for Dr. Arbie CookeyEarly)  Obesity: Estimated body mass index is 42.57 kg/m as calculated from the following:   Height as of this encounter: 5\' 8"  (1.727 m).   Weight as of this encounter: 127 kg.     Diet: Diet Order            Diet NPO time specified Except for: Sips with Meds  Diet effective midnight        Diet heart healthy/carb modified Room service appropriate? Yes; Fluid consistency: Thin  Diet effective now               DVT Prophylaxis: Prophylactic Heparin  Code Status: Full code  Family Communication: Son over the phone on 10/1  Disposition Plan: Remain inpatient-probably will require SNF on discharge  Antimicrobial agents: Anti-infectives (From admission, onward)   Start  Dose/Rate Route Frequency Ordered Stop   09/21/19 1200  vancomycin (VANCOCIN) 1,500 mg in sodium chloride 0.9 % 500 mL IVPB  Status:  Discontinued     1,500 mg 250 mL/hr over 120  Minutes Intravenous Every 48 hours 09/19/19 1110 09/20/19 0907   09/20/19 2230  cefTRIAXone (ROCEPHIN) 1 g in sodium chloride 0.9 % 100 mL IVPB     1 g 200 mL/hr over 30 Minutes Intravenous Every 24 hours 09/19/19 1909     09/20/19 1400  vancomycin (VANCOCIN) 1,250 mg in sodium chloride 0.9 % 250 mL IVPB     1,250 mg 166.7 mL/hr over 90 Minutes Intravenous Every 24 hours 09/20/19 0907     09/19/19 2330  ceFEPIme (MAXIPIME) 2 g in sodium chloride 0.9 % 100 mL IVPB  Status:  Discontinued     2 g 200 mL/hr over 30 Minutes Intravenous Every 12 hours 09/19/19 1110 09/19/19 1909   09/19/19 2230  ceFEPIme (MAXIPIME) 2 g in sodium chloride 0.9 % 100 mL IVPB     2 g 200 mL/hr over 30 Minutes Intravenous  Once 09/19/19 1909 09/20/19 0005   09/19/19 1030  ceFEPIme (MAXIPIME) 2 g in sodium chloride 0.9 % 100 mL IVPB     2 g 200 mL/hr over 30 Minutes Intravenous  Once 09/19/19 1022 09/19/19 1306   09/19/19 1030  vancomycin (VANCOCIN) 2,000 mg in sodium chloride 0.9 % 500 mL IVPB     2,000 mg 250 mL/hr over 120 Minutes Intravenous  Once 09/19/19 1022 09/19/19 1525   09/19/19 1015  metroNIDAZOLE (FLAGYL) IVPB 500 mg     500 mg 100 mL/hr over 60 Minutes Intravenous  Once 09/19/19 1010 09/19/19 1306      Procedures: None  CONSULTS:  vascular surgery  Time spent: 25- minutes-Greater than 50% of this time was spent in counseling, explanation of diagnosis, planning of further management, and coordination of care.  MEDICATIONS: Scheduled Meds:  amLODipine  5 mg Oral Daily   aspirin EC  81 mg Oral Daily   heparin  5,000 Units Subcutaneous Q8H   insulin aspart  0-15 Units Subcutaneous Q6H   insulin aspart  0-5 Units Subcutaneous QHS   nebivolol  10 mg Oral Daily   nystatin cream   Topical BID   sodium chloride flush  3 mL Intravenous Q12H   Continuous Infusions:  cefTRIAXone (ROCEPHIN)  IV 1 g (09/20/19 2301)   dextrose 125 mL/hr at 09/21/19 0912   vancomycin 1,250 mg (09/20/19  1437)   PRN Meds:.acetaminophen **OR** acetaminophen, albuterol, ondansetron **OR** ondansetron (ZOFRAN) IV   PHYSICAL EXAM: Vital signs: Vitals:   09/20/19 2134 09/21/19 0500 09/21/19 0856 09/21/19 0908  BP: (!) 112/53 (!) 141/51 (!) 110/55 (!) 110/55  Pulse: 72 67  71  Resp: Temp: 98 F (36.7 C) 98.1 F (36.7 C)  98.4 F (36.9 C)  TempSrc:  Oral  Oral  SpO2: 95% 100%  99%  Weight:      Height:       Filed Weights   09/19/19 1014  Weight: 127 kg   Body mass index is 42.57 kg/m.   Gen Exam:Alert awake-not in any distress HEENT:atraumatic, normocephalic Chest: B/L clear to auscultation anteriorly CVS:S1S2 regular Abdomen:soft non tender, non distended Extremities: Trace edema-see ulcers below. Neurology: Non focal Skin: no rash        I have personally reviewed following labs and imaging studies  LABORATORY DATA: CBC: Recent Labs  Lab 09/19/19 0855 09/20/19  0600 09/21/19 0650  WBC 15.5* 15.7* 12.0*  NEUTROABS 13.4*  --   --   HGB 18.6* 18.3* 16.4*  HCT 58.8* 59.1* 50.4*  MCV 91.0 94.9 88.9  PLT 186 120* 148*    Basic Metabolic Panel: Recent Labs  Lab 09/19/19 0855 09/19/19 1537 09/20/19 0510 09/20/19 0640 09/21/19 0650  NA 154* 155* 119* 153* 145  K 4.0 2.7* 2.8* 3.9 3.4*  CL 114* 123* 91* 118* 112*  CO2 21* 23 15* 18* 20*  GLUCOSE 264* 194* 1,196* 267* 230*  BUN 116* 90* 58* 75* 51*  CREATININE 2.30* 1.52* 1.11* 1.27* 1.15*  CALCIUM 9.5 7.8* 6.7* 8.8* 8.5*  MG  --   --   --   --  2.5*    GFR: Estimated Creatinine Clearance: 64 mL/min (A) (by C-G formula based on SCr of 1.15 mg/dL (H)).  Liver Function Tests: Recent Labs  Lab 09/19/19 0855 09/20/19 0510 09/20/19 0640 09/21/19 0650  AST 61* 49* 68* 43*  ALT 36 28 38 33  ALKPHOS 74 47 66 60  BILITOT 1.4* 1.4* 2.0* 1.5*  PROT 7.6 4.8* 6.2* 5.6*  ALBUMIN 3.5 2.0* 2.6* 2.3*   No results for input(s): LIPASE, AMYLASE in the last 168 hours. No results for input(s):  AMMONIA in the last 168 hours.  Coagulation Profile: No results for input(s): INR, PROTIME in the last 168 hours.  Cardiac Enzymes: Recent Labs  Lab 09/19/19 0855 09/20/19 0510 09/20/19 0640  CKTOTAL 1,495* 1,219* 1,672*    BNP (last 3 results) No results for input(s): PROBNP in the last 8760 hours.  HbA1C: Recent Labs    09/19/19 0855  HGBA1C 6.7*    CBG: Recent Labs  Lab 09/20/19 2017 09/20/19 2309 09/21/19 0013 09/21/19 0106 09/21/19 0632  GLUCAP 212* 238* 201* 188* 226*    Lipid Profile: No results for input(s): CHOL, HDL, LDLCALC, TRIG, CHOLHDL, LDLDIRECT in the last 72 hours.  Thyroid Function Tests: No results for input(s): TSH, T4TOTAL, FREET4, T3FREE, THYROIDAB in the last 72 hours.  Anemia Panel: No results for input(s): VITAMINB12, FOLATE, FERRITIN, TIBC, IRON, RETICCTPCT in the last 72 hours.  Urine analysis:    Component Value Date/Time   COLORURINE YELLOW 09/19/2019 1200   APPEARANCEUR CLOUDY (A) 09/19/2019 1200   LABSPEC 1.018 09/19/2019 1200   PHURINE 5.0 09/19/2019 1200   GLUCOSEU 150 (A) 09/19/2019 1200   HGBUR MODERATE (A) 09/19/2019 1200   BILIRUBINUR NEGATIVE 09/19/2019 1200   KETONESUR NEGATIVE 09/19/2019 1200   PROTEINUR 100 (A) 09/19/2019 1200   NITRITE NEGATIVE 09/19/2019 1200   LEUKOCYTESUR LARGE (A) 09/19/2019 1200    Sepsis Labs: Lactic Acid, Venous    Component Value Date/Time   LATICACIDVEN 1.7 09/19/2019 1537    MICROBIOLOGY: Recent Results (from the past 240 hour(s))  Blood culture (routine x 2)     Status: None (Preliminary result)   Collection Time: 09/19/19  8:56 AM   Specimen: BLOOD RIGHT HAND  Result Value Ref Range Status   Specimen Description BLOOD RIGHT HAND  Final   Special Requests   Final    BOTTLES DRAWN AEROBIC AND ANAEROBIC Blood Culture results may not be optimal due to an inadequate volume of blood received in culture bottles   Culture   Final    NO GROWTH 2 DAYS Performed at [facility], [place] [address], [place],  [address]    Report Status PENDING  Incomplete  SARS Coronavirus 2 [facility] order, Performed in [facility] hospital lab)  Nasopharyngeal Nasopharyngeal Swab     Status: None   Collection Time: 09/19/19  9:06 AM   Specimen: Nasopharyngeal Swab  Result Value Ref Range Status   SARS Coronavirus 2 NEGATIVE NEGATIVE Final    Comment: (NOTE) If result is NEGATIVE SARS-CoV-2 target nucleic acids are NOT DETECTED. The SARS-CoV-2 RNA is generally detectable in upper and lower  respiratory specimens during the acute phase of infection. The lowest  concentration of SARS-CoV-2 viral copies this assay can detect is 250  copies / mL. A negative result does not preclude SARS-CoV-2 infection  and should not be used as the sole basis for treatment or other  patient management decisions.  A negative result may occur with  improper specimen collection / handling, submission of specimen other  than nasopharyngeal swab, presence of viral mutation(s) within the  areas targeted by this assay, and inadequate number of viral copies  (<250 copies / mL). A negative result must be combined with clinical  observations, patient history, and epidemiological information. If result is POSITIVE SARS-CoV-2 target nucleic acids are DETECTED. The SARS-CoV-2 RNA is generally detectable in upper and lower  respiratory specimens dur ing the acute phase of infection.  Positive  results are indicative of active infection with SARS-CoV-2.  Clinical  correlation with patient history and other diagnostic information is  necessary to determine patient infection status.  Positive results do  not rule out bacterial infection or co-infection with other viruses. If result is PRESUMPTIVE POSTIVE SARS-CoV-2 nucleic acids MAY BE PRESENT.   A presumptive positive result was obtained on the submitted specimen  and confirmed on repeat testing.  While 2019 novel coronavirus  (SARS-CoV-2) nucleic  acids may be present in the submitted sample  additional confirmatory testing may be necessary for epidemiological  and / or clinical management purposes  to differentiate between  SARS-CoV-2 and other Sarbecovirus currently known to infect humans.  If clinically indicated additional testing with an alternate test  methodology [phone]) is advised. The SARS-CoV-2 RNA is generally  detectable in upper and lower respiratory sp ecimens during the acute  phase of infection. The expected result is Negative. Fact Sheet for Patients:  BoilerBrush.com.cy Fact Sheet for Healthcare Providers: https://pope.com/ This test is not yet approved or cleared by the [place] FDA and has been authorized for detection and/or diagnosis of SARS-CoV-2 by FDA under an Emergency Use Authorization (EUA).  This EUA will remain in effect (meaning this test can be used) for the duration of the COVID-19 declaration under Section 564(b)(1) of the Act, 21 U.S.C. section 360bbb-3(b)(1), unless the authorization is terminated or revoked sooner. Performed at [facility], [address] [place], [place] [address]   Blood culture (routine x 2)     Status: None (Preliminary result)   Collection Time: 09/19/19 11:42 AM   Specimen: BLOOD  Result Value Ref Range Status   Specimen Description BLOOD LEFT ANTECUBITAL  Final   Special Requests   Final    BOTTLES DRAWN AEROBIC ONLY Blood Culture results may not be optimal due to an inadequate volume of blood received in culture bottles   Culture   Final    NO GROWTH 2 DAYS Performed at [facility], [address], [place], [place] [address]    Report Status PENDING  Incomplete  Urine culture     Status: Abnormal   Collection Time: 09/19/19 12:15 PM   Specimen: Urine, Clean Catch  Result Value Ref Range Status   Specimen Description URINE,  CLEAN CATCH  Final   Special Requests   Final    NONE Performed at  [facility], [address], [place], [place] [address]    Culture MULTIPLE SPECIES PRESENT, SUGGEST RECOLLECTION (A)  Final   Report Status 09/20/2019 FINAL  Final    RADIOLOGY STUDIES/RESULTS: Dg Pelvis 1-2 Views  Result Date: 09/19/2019 CLINICAL DATA:  Pain after fall EXAM: PELVIS - 1-2 VIEW COMPARISON:  None. FINDINGS: There is no evidence of pelvic fracture or diastasis. No pelvic bone lesions are seen. IMPRESSION: Negative. Electronically Signed   By: Gerome Sam III M.D   On: 09/19/2019 10:22   Ct Head Wo Contrast  Result Date: 09/19/2019 CLINICAL DATA:  Headache. EXAM: CT HEAD WITHOUT CONTRAST CT CERVICAL SPINE WITHOUT CONTRAST TECHNIQUE: Multidetector CT imaging of the head and cervical spine was performed following the standard protocol without intravenous contrast. Multiplanar CT image reconstructions of the cervical spine were also generated. COMPARISON:  None. FINDINGS: CT HEAD FINDINGS Brain: No evidence of acute infarction, hemorrhage, hydrocephalus, extra-axial collection or mass lesion/mass effect. There is mild diffuse low-attenuation within the subcortical and periventricular white matter compatible with chronic microvascular disease. Enlargement of the sulci and ventricles compatible with brain atrophy. Vascular: No hyperdense vessel or unexpected calcification. Skull: Normal. Negative for fracture or focal lesion. Sinuses/Orbits: No acute finding. Other: None. CT CERVICAL SPINE FINDINGS Alignment: Normal. Skull base and vertebrae: No acute fracture. No primary bone lesion or focal pathologic process. Soft tissues and spinal canal: No prevertebral fluid or swelling. No visible canal hematoma. Disc levels: Marked multi level disc space narrowing and endplate spurring. Upper chest: Negative. Other: None IMPRESSION: 1. No acute intracranial abnormality. 2. Chronic small vessel ischemic change and brain atrophy. 3. No evidence for cervical spine fracture. 4. Cervical  degenerative disc disease. Electronically Signed   By: Signa Kell M.D.   On: 09/19/2019 09:52   Ct Cervical Spine Wo Contrast  Result Date: 09/19/2019 CLINICAL DATA:  Headache. EXAM: CT HEAD WITHOUT CONTRAST CT CERVICAL SPINE WITHOUT CONTRAST TECHNIQUE: Multidetector CT imaging of the head and cervical spine was performed following the standard protocol without intravenous contrast. Multiplanar CT image reconstructions of the cervical spine were also generated. COMPARISON:  None. FINDINGS: CT HEAD FINDINGS Brain: No evidence of acute infarction, hemorrhage, hydrocephalus, extra-axial collection or mass lesion/mass effect. There is mild diffuse low-attenuation within the subcortical and periventricular white matter compatible with chronic microvascular disease. Enlargement of the sulci and ventricles compatible with brain atrophy. Vascular: No hyperdense vessel or unexpected calcification. Skull: Normal. Negative for fracture or focal lesion. Sinuses/Orbits: No acute finding. Other: None. CT CERVICAL SPINE FINDINGS Alignment: Normal. Skull base and vertebrae: No acute fracture. No primary bone lesion or focal pathologic process. Soft tissues and spinal canal: No prevertebral fluid or swelling. No visible canal hematoma. Disc levels: Marked multi level disc space narrowing and endplate spurring. Upper chest: Negative. Other: None IMPRESSION: 1. No acute intracranial abnormality. 2. Chronic small vessel ischemic change and brain atrophy. 3. No evidence for cervical spine fracture. 4. Cervical degenerative disc disease. Electronically Signed   By: Signa Kell M.D.   On: 09/19/2019 09:52   Dg Chest Portable 1 View  Result Date: 09/19/2019 CLINICAL DATA:  Pain after fall EXAM: PORTABLE CHEST 1 VIEW COMPARISON:  None. FINDINGS: There is a tortuous thoracic aorta. The heart, hila, mediastinum, lungs, and pleura are otherwise unremarkable. IMPRESSION: No active disease. Electronically Signed   By: Gerome Sam III M.D  On: 09/19/2019 10:33   Dg Tibia/fibula Left Port  Result Date: 09/19/2019 CLINICAL DATA:  Pain after fall EXAM: PORTABLE LEFT TIBIA AND FIBULA - 2 VIEW COMPARISON:  None. FINDINGS: Degenerative changes in the knee.  No fractures. IMPRESSION: No fractures. Electronically Signed   By: Gerome Sam III M.D   On: 09/19/2019 10:23   Dg Hand Complete Right  Result Date: 09/19/2019 CLINICAL DATA:  Pain after fall EXAM: RIGHT HAND - COMPLETE 3+ VIEW COMPARISON:  None. FINDINGS: Known usual configuration of the distal radius is likely due to remote healed trauma. No acute fracture noted. IMPRESSION: No acute fractures are seen. Electronically Signed   By: Gerome Sam III M.D   On: 09/19/2019 10:25   Dg Foot Complete Left  Result Date: 09/19/2019 CLINICAL DATA:  Pain after fall EXAM: LEFT FOOT - COMPLETE 3+ VIEW COMPARISON:  None. FINDINGS: There is no evidence of fracture or dislocation. There is no evidence of arthropathy or other focal bone abnormality. Soft tissues are unremarkable. IMPRESSION: Negative. Electronically Signed   By: Gerome Sam III M.D   On: 09/19/2019 10:28   Dg Foot Complete Right  Result Date: 09/19/2019 CLINICAL DATA:  Pain after fall EXAM: RIGHT FOOT COMPLETE - 3+ VIEW COMPARISON:  None. FINDINGS: Severe soft tissue swelling in the foot, particularly along the top of the foot. No fractures, dislocations, or bony erosion. IMPRESSION: Soft tissue swelling.  No other acute abnormalities. Electronically Signed   By: Gerome Sam III M.D   On: 09/19/2019 10:26   Vas [place] Abi With/wo Tbi  Result Date: 09/21/2019 LOWER EXTREMITY DOPPLER STUDY Indications: Ulceration, and Ischemic lower extremity.  Comparison Study: No prvious study available Performing Technologist: Milta Deiters, [place] [facility]  Examination Guidelines: A complete evaluation includes at minimum, Doppler waveform signals and systolic blood pressure reading at the level of bilateral brachial,  anterior tibial, and posterior tibial arteries, when vessel segments are accessible. Bilateral testing is considered an integral part of a complete examination. Photoelectric Plethysmograph (PPG) waveforms and toe systolic pressure readings are included as required and additional duplex testing as needed. Limited examinations for reoccurring indications may be performed as noted.  ABI Findings: +---------+------------------+-----+---------+--------+  Right     Rt Pressure (mmHg) Index Waveform  Comment   +---------+------------------+-----+---------+--------+  Brachial  152                      triphasic           +---------+------------------+-----+---------+--------+  PTA       150                0.97  triphasic           +---------+------------------+-----+---------+--------+  DP        179                1.15  triphasic           +---------+------------------+-----+---------+--------+  Great Toe 124                0.80                      +---------+------------------+-----+---------+--------+ +---------+------------------+-----+---------+-------+  Left      Lt Pressure (mmHg) Index Waveform  Comment  +---------+------------------+-----+---------+-------+  Brachial  155                      triphasic          +---------+------------------+-----+---------+-------+  PTA       132                0.85  biphasic           +---------+------------------+-----+---------+-------+  DP        159                1.03  biphasic           +---------+------------------+-----+---------+-------+  Great Toe 255                1.65                     +---------+------------------+-----+---------+-------+ +-------+-----------+-----------+------------+------------+  ABI/TBI Today's ABI Today's TBI Previous ABI Previous TBI  +-------+-----------+-----------+------------+------------+  Right   1.15        0.80                                   +-------+-----------+-----------+------------+------------+  Left    1.03        calcified                               +-------+-----------+-----------+------------+------------+ No comparison available  Summary: Right: Resting right ankle-brachial index is within normal range. No evidence of significant right lower extremity arterial disease. The right toe-brachial index is normal. Left: Resting left ankle-brachial index is within normal range. No evidence of significant left lower extremity arterial disease. The left toe-brachial index is abnormal. TBI is non compressible consistent with calcification.  *See table(s) above for measurements and observations.    Preliminary    Dg Femur Portable 1 View Left  Result Date: 09/19/2019 CLINICAL DATA:  Pain after fall EXAM: LEFT FEMUR PORTABLE 1 VIEW COMPARISON:  None. FINDINGS: Degenerative changes are seen in the knee.  No fractures are seen. IMPRESSION: Negative. Electronically Signed   By: Gerome Sam III M.D   On: 09/19/2019 10:32   Dg Femur Portable 1 View Right  Result Date: 09/19/2019 CLINICAL DATA:  Pain after fall EXAM: RIGHT FEMUR PORTABLE 1 VIEW COMPARISON:  None. FINDINGS: Degenerative changes in the knee.  No fractures seen in the femur. IMPRESSION: No fractures identified. Electronically Signed   By: Gerome Sam III M.D   On: 09/19/2019 10:30     LOS: 2 days   Jeoffrey Massed, MD  [facility]  If 7PM-7AM, please contact night-coverage  Please page via [email or web address]  Go to [email or web address] and use [facility]'s universal password to access. If you do not have the password, please contact the hospital operator.  Locate the [facility] provider you are looking for under [facility] and page to a number that you can be directly reached. If you still have difficulty reaching the provider, please page the [facility] (Director on Call) for the Hospitalists listed on amion for assistance.  09/21/2019, 12:06 PM

## 2019-09-21 NOTE — Plan of Care (Signed)
  Problem: Skin Integrity: Goal: Risk for impaired skin integrity will decrease Outcome: Progressing   

## 2019-09-21 NOTE — Progress Notes (Signed)
Bilateral ABIs and TBIs completed. Preliminary results I Chart review [place], [place] 09/21/2019, 11:07 AM

## 2019-09-21 NOTE — Progress Notes (Addendum)
  Progress Note    09/21/2019 8:08 AM * No surgery found *  Subjective:  No complaints this morning.  Tearful about need for toe amputations   Vitals:   09/20/19 2134 09/21/19 0500  BP: (!) 112/53 (!) 141/51  Pulse: 72 67  Resp: 18 18  Temp: 98 F (36.7 C) 98.1 F (36.7 C)  SpO2: 95% 100%   Physical Exam: Lungs:  Non labored Extremities:  Palpable L ATA, see consult note for picture of L foot Abdomen:  Soft Neurologic: A&O  CBC    Component Value Date/Time   WBC 12.0 (H) 09/21/2019 0650   RBC 5.67 (H) 09/21/2019 0650   HGB 16.4 (H) 09/21/2019 0650   HCT 50.4 (H) 09/21/2019 0650   PLT 148 (L) 09/21/2019 0650   MCV 88.9 09/21/2019 0650   MCH 28.9 09/21/2019 0650   MCHC 32.5 09/21/2019 0650   RDW 14.1 09/21/2019 0650   LYMPHSABS 1.0 09/19/2019 0855   MONOABS 1.0 09/19/2019 0855   EOSABS 0.0 09/19/2019 0855   BASOSABS 0.0 09/19/2019 0855    BMET    Component Value Date/Time   NA 145 09/21/2019 0650   K 3.4 (L) 09/21/2019 0650   CL 112 (H) 09/21/2019 0650   CO2 20 (L) 09/21/2019 0650   GLUCOSE 230 (H) 09/21/2019 0650   BUN 51 (H) 09/21/2019 0650   CREATININE 1.15 (H) 09/21/2019 0650   CALCIUM 8.5 (L) 09/21/2019 0650   GFRNONAA 48 (L) 09/21/2019 0650   GFRAA 56 (L) 09/21/2019 0650    INR No results found for: INR   Intake/Output Summary (Last 24 hours) at 09/21/2019 2993 Last data filed at 09/21/2019 0549 Gross per 24 hour  Intake 2472.27 ml  Output 800 ml  Net 1672.27 ml     Assessment/Plan:  73 y.o. female with gangrenous L 3-5th toes  ABI pending Continue IV antibiotics and IVF Dr. Trula Slade to determine need for angiography prior to amputation of toes 3,4,5 based on ABIs    Dagoberto Ligas, PA-C [facility] [phone] 09/21/2019 8:08 AM   I agree with the above.  U/S suggests adequate blood flow to heal amputation. I recommended amputation of left toes 3,4,5 tomorrow.  Patient is in agreement.  NPO after midnight    Wells BRabham

## 2019-09-22 ENCOUNTER — Inpatient Hospital Stay (HOSPITAL_COMMUNITY): Payer: Medicare Other | Admitting: Anesthesiology

## 2019-09-22 ENCOUNTER — Encounter (HOSPITAL_COMMUNITY): Payer: Self-pay | Admitting: Anesthesiology

## 2019-09-22 ENCOUNTER — Encounter (HOSPITAL_COMMUNITY)
Admission: EM | Disposition: A | Discharge status: Rehabilitation Facility | Payer: Self-pay | Source: Skilled Nursing Facility | Attending: Internal Medicine

## 2019-09-22 HISTORY — PX: AMPUTATION: SHX166

## 2019-09-22 LAB — COMPREHENSIVE METABOLIC PANEL
ALT: 31 U/L (ref 0–44)
AST: 33 U/L (ref 15–41)
Albumin: 2.2 g/dL — ABNORMAL LOW (ref 3.5–5.0)
Alkaline Phosphatase: 61 U/L (ref 38–126)
Anion gap: 10 (ref 5–15)
BUN: 32 mg/dL — ABNORMAL HIGH (ref 8–23)
CO2: 22 mmol/L (ref 22–32)
Calcium: 8.3 mg/dL — ABNORMAL LOW (ref 8.9–10.3)
Chloride: 109 mmol/L (ref 98–111)
Creatinine, Ser: 0.9 mg/dL (ref 0.44–1.00)
GFR calc Af Amer: >60 mL/min (ref >60)
GFR calc non Af Amer: >60 mL/min (ref >60)
Glucose, Bld: 204 mg/dL — ABNORMAL HIGH (ref 70–99)
Potassium: 3 mmol/L — ABNORMAL LOW (ref 3.5–5.1)
Sodium: 141 mmol/L (ref 135–145)
Total Bilirubin: 1.1 mg/dL (ref 0.3–1.2)
Total Protein: 5.4 g/dL — ABNORMAL LOW (ref 6.5–8.1)

## 2019-09-22 LAB — GLUCOSE, CAPILLARY
Glucose-Capillary: 150 mg/dL — ABNORMAL HIGH (ref 70–99)
Glucose-Capillary: 167 mg/dL — ABNORMAL HIGH (ref 70–99)
Glucose-Capillary: 177 mg/dL — ABNORMAL HIGH (ref 70–99)
Glucose-Capillary: 178 mg/dL — ABNORMAL HIGH (ref 70–99)
Glucose-Capillary: 198 mg/dL — ABNORMAL HIGH (ref 70–99)
Glucose-Capillary: 219 mg/dL — ABNORMAL HIGH (ref 70–99)
Glucose-Capillary: 302 mg/dL — ABNORMAL HIGH (ref 70–99)

## 2019-09-22 LAB — CBC
HCT: 46.8 % — ABNORMAL HIGH (ref 36.0–46.0)
Hemoglobin: 15.3 g/dL — ABNORMAL HIGH (ref 12.0–15.0)
MCH: 29.1 pg (ref 26.0–34.0)
MCHC: 32.7 g/dL (ref 30.0–36.0)
MCV: 89 fL (ref 80.0–100.0)
Platelets: 145 10*3/uL — ABNORMAL LOW (ref 150–400)
RBC: 5.26 MIL/uL — ABNORMAL HIGH (ref 3.87–5.11)
RDW: 13.9 % (ref 11.5–15.5)
WBC: 12 10*3/uL — ABNORMAL HIGH (ref 4.0–10.5)
nRBC: 0 % (ref 0.0–0.2)

## 2019-09-22 LAB — CK: Total CK: 510 U/L — ABNORMAL HIGH (ref 38–234)

## 2019-09-22 LAB — SURGICAL PCR SCREEN
MRSA, PCR: NEGATIVE
Staphylococcus aureus: POSITIVE — AB

## 2019-09-22 SURGERY — AMPUTATION DIGIT
Anesthesia: General | Laterality: Left

## 2019-09-22 MED ORDER — LACTATED RINGERS IV SOLN
INTRAVENOUS | Status: DC
  Administered 2019-09-22 (×2): via INTRAVENOUS

## 2019-09-22 MED ORDER — CHLORHEXIDINE GLUCONATE CLOTH 2 % EX PADS
6.0000 | MEDICATED_PAD | Freq: Every day | CUTANEOUS | Status: DC
  Administered 2019-09-23 – 2019-09-26 (×4): 6 via TOPICAL

## 2019-09-22 MED ORDER — LIDOCAINE 2% (20 MG/ML) 5 ML SYRINGE
INTRAMUSCULAR | Status: AC
  Filled 2019-09-22: qty 5

## 2019-09-22 MED ORDER — LIDOCAINE HCL (CARDIAC) PF 100 MG/5ML IV SOSY
PREFILLED_SYRINGE | INTRAVENOUS | Status: DC | PRN
  Administered 2019-09-22: 50 mg via INTRAVENOUS

## 2019-09-22 MED ORDER — EPHEDRINE SULFATE-NACL 50-0.9 MG/10ML-% IV SOSY
PREFILLED_SYRINGE | INTRAVENOUS | Status: DC | PRN
  Administered 2019-09-22: 5 mg via INTRAVENOUS
  Administered 2019-09-22 (×2): 10 mg via INTRAVENOUS

## 2019-09-22 MED ORDER — POTASSIUM CHLORIDE CRYS ER 20 MEQ PO TBCR
40.0000 meq | EXTENDED_RELEASE_TABLET | Freq: Once | ORAL | Status: AC
  Administered 2019-09-22: 40 meq via ORAL
  Filled 2019-09-22 (×2): qty 2

## 2019-09-22 MED ORDER — PHENYLEPHRINE 40 MCG/ML (10ML) SYRINGE FOR IV PUSH (FOR BLOOD PRESSURE SUPPORT)
PREFILLED_SYRINGE | INTRAVENOUS | Status: AC
  Filled 2019-09-22: qty 10

## 2019-09-22 MED ORDER — DEXAMETHASONE SODIUM PHOSPHATE 4 MG/ML IJ SOLN
INTRAMUSCULAR | Status: DC | PRN
  Administered 2019-09-22: 4 mg via INTRAVENOUS

## 2019-09-22 MED ORDER — EPHEDRINE 5 MG/ML INJ
INTRAVENOUS | Status: AC
  Filled 2019-09-22: qty 10

## 2019-09-22 MED ORDER — FENTANYL CITRATE (PF) 100 MCG/2ML IJ SOLN: 25.0000 ug | INTRAMUSCULAR | Status: DC | PRN

## 2019-09-22 MED ORDER — PROPOFOL 10 MG/ML IV BOLUS
INTRAVENOUS | Status: AC
  Filled 2019-09-22: qty 20

## 2019-09-22 MED ORDER — FENTANYL CITRATE (PF) 250 MCG/5ML IJ SOLN
INTRAMUSCULAR | Status: AC
  Filled 2019-09-22: qty 5

## 2019-09-22 MED ORDER — 0.9 % SODIUM CHLORIDE (POUR BTL) OPTIME
TOPICAL | Status: DC | PRN
  Administered 2019-09-22: 1000 mL

## 2019-09-22 MED ORDER — PROPOFOL 10 MG/ML IV BOLUS
INTRAVENOUS | Status: DC | PRN
  Administered 2019-09-22: 150 mg via INTRAVENOUS

## 2019-09-22 MED ORDER — PHENYLEPHRINE 40 MCG/ML (10ML) SYRINGE FOR IV PUSH (FOR BLOOD PRESSURE SUPPORT)
PREFILLED_SYRINGE | INTRAVENOUS | Status: DC | PRN
  Administered 2019-09-22: 40 ug via INTRAVENOUS
  Administered 2019-09-22: 80 ug via INTRAVENOUS
  Administered 2019-09-22: 120 ug via INTRAVENOUS
  Administered 2019-09-22 (×2): 80 ug via INTRAVENOUS

## 2019-09-22 MED ORDER — MUPIROCIN 2 % EX OINT
1.0000 "application " | TOPICAL_OINTMENT | Freq: Two times a day (BID) | CUTANEOUS | Status: DC
  Administered 2019-09-22 – 2019-09-26 (×9): 1 via NASAL
  Filled 2019-09-22 (×2): qty 22

## 2019-09-22 MED ORDER — ONDANSETRON HCL 4 MG/2ML IJ SOLN
INTRAMUSCULAR | Status: AC
  Filled 2019-09-22: qty 2

## 2019-09-22 MED ORDER — ONDANSETRON HCL 4 MG/2ML IJ SOLN: 4.0000 mg | Freq: Once | INTRAMUSCULAR | Status: DC | PRN

## 2019-09-22 MED ORDER — DEXAMETHASONE SODIUM PHOSPHATE 10 MG/ML IJ SOLN
INTRAMUSCULAR | Status: AC
  Filled 2019-09-22: qty 1

## 2019-09-22 MED ORDER — ONDANSETRON HCL 4 MG/2ML IJ SOLN
INTRAMUSCULAR | Status: DC | PRN
  Administered 2019-09-22: 4 mg via INTRAVENOUS

## 2019-09-22 SURGICAL SUPPLY — 29 items
BNDG ELASTIC 4X5.8 VLCR STR LF (GAUZE/BANDAGES/DRESSINGS) ×2 IMPLANT
BNDG GAUZE ELAST 4 BULKY (GAUZE/BANDAGES/DRESSINGS) ×2 IMPLANT
CANISTER SUCT 3000ML PPV (MISCELLANEOUS) ×2 IMPLANT
COVER SURGICAL LIGHT HANDLE (MISCELLANEOUS) ×2 IMPLANT
COVER WAND RF STERILE (DRAPES) ×2 IMPLANT
DRAPE EXTREMITY T 121X128X90 (DISPOSABLE) ×2 IMPLANT
DRAPE HALF SHEET 40X57 (DRAPES) ×2 IMPLANT
DRSG EMULSION OIL 3X3 NADH (GAUZE/BANDAGES/DRESSINGS) ×2 IMPLANT
DRSG XEROFORM 1X8 (GAUZE/BANDAGES/DRESSINGS) ×1 IMPLANT
ELECT REM PT RETURN 9FT ADLT (ELECTROSURGICAL) ×2
ELECTRODE REM PT RTRN 9FT ADLT (ELECTROSURGICAL) ×1 IMPLANT
GAUZE SPONGE 4X4 12PLY STRL (GAUZE/BANDAGES/DRESSINGS) ×2 IMPLANT
GAUZE SPONGE 4X4 12PLY STRL LF (GAUZE/BANDAGES/DRESSINGS) ×1 IMPLANT
GLOVE BIO SURGEON STRL SZ7.5 (GLOVE) ×2 IMPLANT
GOWN STRL REUS W/ TWL LRG LVL3 (GOWN DISPOSABLE) ×3 IMPLANT
GOWN STRL REUS W/TWL LRG LVL3 (GOWN DISPOSABLE) ×6
KIT BASIN OR (CUSTOM PROCEDURE TRAY) ×2 IMPLANT
KIT TURNOVER KIT B (KITS) ×2 IMPLANT
NS IRRIG 1000ML POUR BTL (IV SOLUTION) ×2 IMPLANT
PACK GENERAL/GYN (CUSTOM PROCEDURE TRAY) ×2 IMPLANT
PAD ARMBOARD 7.5X6 YLW CONV (MISCELLANEOUS) ×4 IMPLANT
SPECIMEN JAR SMALL (MISCELLANEOUS) ×2 IMPLANT
SUT ETHILON 3 0 PS 1 (SUTURE) ×2 IMPLANT
SUT VIC AB 3-0 SH 27 (SUTURE) ×2
SUT VIC AB 3-0 SH 27X BRD (SUTURE) IMPLANT
TOWEL GREEN STERILE (TOWEL DISPOSABLE) ×4 IMPLANT
TOWEL GREEN STERILE FF (TOWEL DISPOSABLE) ×2 IMPLANT
UNDERPAD 30X30 (UNDERPADS AND DIAPERS) ×2 IMPLANT
WATER STERILE IRR 1000ML POUR (IV SOLUTION) ×2 IMPLANT

## 2019-09-22 NOTE — Plan of Care (Signed)
  Problem: Skin Integrity: Goal: Risk for impaired skin integrity will decrease Outcome: Progressing   

## 2019-09-22 NOTE — Transfer of Care (Signed)
Immediate Anesthesia Transfer of Care Note  Patient: Loretta [name]  Procedure(s) Performed: AMPUTATION THIRD, FOURTH AND FIFTH TOES LEFT FOOT (Left )  Patient Location: PACU  Anesthesia Type:General  Level of Consciousness: awake, oriented and patient cooperative  Airway & Oxygen Therapy: Patient Spontanous Breathing and Patient connected to nasal cannula oxygen  Post-op Assessment: Report given to RN and Post -op Vital signs reviewed and stable  Post vital signs: Reviewed  Last Vitals:  Vitals Value Taken Time  BP 102/46 09/22/19 1457  Temp 36.3 C 09/22/19 1455  Pulse 72 09/22/19 1502  Resp 15 09/22/19 1502  SpO2 95 % 09/22/19 1502  Vitals shown include unvalidated device data.  Last Pain:  Vitals:   09/22/19 1455  TempSrc:   PainSc: 0-No pain         Complications: No apparent anesthesia complications

## 2019-09-22 NOTE — Anesthesia Procedure Notes (Signed)
Procedure Name: LMA Insertion Date/Time: 09/22/2019 1:26 PM Performed by: Jenne Campus, CRNA Pre-anesthesia Checklist: Patient identified, Emergency Drugs available, Suction available and Patient being monitored Patient Re-evaluated:Patient Re-evaluated prior to induction Oxygen Delivery Method: Circle System Utilized Preoxygenation: Pre-oxygenation with 100% oxygen Induction Type: IV induction Ventilation: Mask ventilation without difficulty LMA: LMA inserted LMA Size: 4.0 Number of attempts: 1 Airway Equipment and Method: Bite block Placement Confirmation: positive ETCO2 and breath sounds checked- equal and bilateral Tube secured with: Tape Dental Injury: Teeth and Oropharynx as per pre-operative assessment

## 2019-09-22 NOTE — Progress Notes (Signed)
PT Cancellation Note  Patient Details Name: Loretta [name] MRN: [id number] DOB: Jan 26, 1946   Cancelled Treatment:    Reason Eval/Treat Not Completed: Patient at procedure or test/unavailable.  Pt gone to procedure on arrival earlier this afternoon.  Will see patient as able 10/3. 09/22/2019  Donnella Sham, PT [facility] [phone]  (pager) [phone]  (office)   Tessie Fass Shelle Galdamez 09/22/2019, 6:37 PM

## 2019-09-22 NOTE — Op Note (Signed)
Procedure: Transmetatarsal amputation of toes 3 4 and 5 left foot  Preoperative diagnosis: Gangrene left foot  Postoperative diagnosis: Same  Anesthesia: General  Assistant: Nurse  Operative findings: Large amount of necrotic soft tissue skin muscle and bone left foot  Operative details: After obtaining informed consent, the patient taken the operating.  The patient placed supine position operating table.  After induction of general anesthesia the patient's left foot was prepped and draped in usual sterile fashion.  I made an elliptical incision incorporating toes 3 4 and 5 of the left foot.  Incision was carried all the way down to the level of the joint space of the phalanx and metatarsal.  This was transected and passed off the table as specimen.  There was still a large amount of necrotic soft tissue bone and muscle in the area.  I debrided this back to healthy bleeding tissue.  There was pulsatile bleeding from digital vessels.  This was controlled with cautery.  I then used a periosteal elevator to elevate the periosteum on the metatarsal shafts of 3 4 and 5.  These were then transected about two thirds of the way down the shaft to get a decent healthy bone tissue.  Wound was thoroughly irrigated with normal saline solution.  Hemostasis was obtained.  The skin edges were then reapproximated with interrupted 3 oh vertical mattress and simple nylon sutures.  Dry sterile dressing was applied.  Patient tolerated procedure well and there were no complications.  Sponge and needle counts correct end of the case.  Patient was taken to the PACU in stable condition.  Ruta Hinds, MD [facility] [place] Office: [phone] Pager: [phone]

## 2019-09-22 NOTE — Anesthesia Postprocedure Evaluation (Signed)
Anesthesia Post Note  Patient: Loretta [name]  Procedure(s) Performed: AMPUTATION THIRD, FOURTH AND FIFTH TOES LEFT FOOT (Left )     Patient location during evaluation: PACU Anesthesia Type: General Level of consciousness: awake and alert Pain management: pain level controlled Vital Signs Assessment: post-procedure vital signs reviewed and stable Respiratory status: spontaneous breathing, nonlabored ventilation, respiratory function stable and patient connected to nasal cannula oxygen Cardiovascular status: blood pressure returned to baseline and stable Postop Assessment: no apparent nausea or vomiting Anesthetic complications: no    Last Vitals:  Vitals:   09/22/19 1525 09/22/19 1548  BP: 127/63 (!) 128/52  Pulse: 78 66  Resp: 16 18  Temp: (!) 36.3 C 36.8 C  SpO2: 96% 96%    Last Pain:  Vitals:   09/22/19 1525  TempSrc:   PainSc: 0-No pain                 [name]

## 2019-09-22 NOTE — Anesthesia Preprocedure Evaluation (Addendum)
Anesthesia Evaluation  Patient identified by MRN, date of birth, ID band Patient awake    Reviewed: Allergy & Precautions, NPO status , Patient's Chart, lab work & pertinent test results  Airway Mallampati: II  TM Distance: >3 FB     Dental  (+) Teeth Intact, Dental Advisory Given   Pulmonary    breath sounds clear to auscultation       Cardiovascular hypertension,  Rhythm:Regular Rate:Normal     Neuro/Psych    GI/Hepatic   Endo/Other  diabetes  Renal/GU      Musculoskeletal   Abdominal (+) + obese,   Peds  Hematology   Anesthesia Other Findings   Reproductive/Obstetrics                             Anesthesia Physical Anesthesia Plan  ASA: III  Anesthesia Plan: General   Post-op Pain Management:    Induction: Intravenous  PONV Risk Score and Plan: Ondansetron  Airway Management Planned: LMA  Additional Equipment:   Intra-op Plan:   Post-operative Plan:   Informed Consent: I have reviewed the patients History and Physical, chart, labs and discussed the procedure including the risks, benefits and alternatives for the proposed anesthesia with the patient or authorized representative who has indicated his/her understanding and acceptance.     Dental advisory given  Plan Discussed with: CRNA and Anesthesiologist  Anesthesia Plan Comments:         Anesthesia Quick Evaluation

## 2019-09-22 NOTE — Progress Notes (Signed)
PROGRESS NOTE        PATIENT DETAILS Name: Loretta [name] Age: 73 y.o. Sex: female Date of Birth: 05-Sep-1946 Admit Date: 09/19/2019 Admitting Physician Clydie Braun, MD ZOX:[id number], Zollie Beckers, MD  Brief Narrative: Patient is a 73 y.o. female with past medical history of HTN, dyslipidemia, DM-2-who was found down at her independent living facility-she was confused-she was brought to the hospital for further evaluation, where she was found to have hyponatremia, AKI and rhabdomyolysis.  She was also found to have numerous ulcerations in the lower extremity.  See below for further details.  Subjective: Awake-alert-no major events overnight.  Has any chest pain or shortness of breath.  Assessment/Plan: Sepsis secondary to UTI and numerous lower extremity ulceration with some surrounding cellulitis: Sepsis pathophysiology has resolved-urine culture showing polymicrobial growth-blood culture remains negative.  VVS following-for amputation of the third, fourth and fifth toes later today.  Plans are to continue with antimicrobial therapy-at least 24 hours post amputation.  AKI: Likely hemodynamically mediated-resolved with supportive care.  Hypernatremia: Secondary to dehydration/poor oral intake-sodium levels have now normalized.  Stop D5W.  Continue to follow electrolytes periodically.    Rhabdomyolysis: Secondary to patient being down on the floor properly for several days-CK levels have markedly improved.  Follow periodically.  Acute metabolic encephalopathy: Suspect secondary to AKI and hyponatremia.  CT head without acute abnormalities.  Encephalopathy has essentially resolved-she is much more awake and alert.  I suspect she is back to her baseline.  Per patient's son-she probably is on a autistic spectrum.    Numerous bilateral lower extremity ulceration-including gangrenous changes to left third, fourth and fifth toes: Vascular surgery following with tentative  plans for amputation of the third, fourth and fifth toes on 10/2.    DM-2: CBGs relatively stable-continue SSI  HTN: Stable-continue amlodipine and Bystolic-continue to hold Benicar and Lasix for now.  Dyslipidemia: Hold statins  Chronic lower extremity edema: Volume status is relatively stable-we will resume diuretics starting tomorrow.  Chronic debility/deconditioning: Claims to walk with either a walker or cane-Per patient's son-ever since the pandemic broke out-patient has not been participating in her usual swimming/other activities as usual.  Suspect she may have developed deconditioning over the past few months.  Awaiting PT/OT eval-but given that the patient was found down at her independent living facility for several days-suspect she will require SNF on discharge.  Obesity: Estimated body mass index is 42.58 kg/m as calculated from the following:   Height as of this encounter: 5' 7.99" (1.727 m).   Weight as of this encounter: 127 kg.     Diet: Diet Order            Diet NPO time specified Except for: Sips with Meds  Diet effective midnight        Diet NPO time specified  Diet effective now               DVT Prophylaxis: Prophylactic Heparin  Code Status: Full code  Family Communication: Son over the phone on 10/1-we will update son later today when she comes back from the operating room.  Disposition Plan: Remain inpatient-probably will require SNF on discharge  Antimicrobial agents: Anti-infectives (From admission, onward)   Start     Dose/Rate Route Frequency Ordered Stop   09/21/19 1200  vancomycin (VANCOCIN) 1,500 mg in sodium chloride 0.9 % 500 mL IVPB  Status:  Discontinued     1,500 mg 250 mL/hr over 120 Minutes Intravenous Every 48 hours 09/19/19 1110 09/20/19 0907   09/20/19 2230  [MAR Hold]  cefTRIAXone (ROCEPHIN) 1 g in sodium chloride 0.9 % 100 mL IVPB     (MAR Hold since Fri 09/22/2019 at 1057.Hold Reason: Transfer to a Procedural area.)   1  g 200 mL/hr over 30 Minutes Intravenous Every 24 hours 09/19/19 1909     09/20/19 1400  [MAR Hold]  vancomycin (VANCOCIN) 1,250 mg in sodium chloride 0.9 % 250 mL IVPB     (MAR Hold since Fri 09/22/2019 at 1057.Hold Reason: Transfer to a Procedural area.)   1,250 mg 166.7 mL/hr over 90 Minutes Intravenous Every 24 hours 09/20/19 0907     09/19/19 2330  ceFEPIme (MAXIPIME) 2 g in sodium chloride 0.9 % 100 mL IVPB  Status:  Discontinued     2 g 200 mL/hr over 30 Minutes Intravenous Every 12 hours 09/19/19 1110 09/19/19 1909   09/19/19 2230  ceFEPIme (MAXIPIME) 2 g in sodium chloride 0.9 % 100 mL IVPB     2 g 200 mL/hr over 30 Minutes Intravenous  Once 09/19/19 1909 09/20/19 0005   09/19/19 1030  ceFEPIme (MAXIPIME) 2 g in sodium chloride 0.9 % 100 mL IVPB     2 g 200 mL/hr over 30 Minutes Intravenous  Once 09/19/19 1022 09/19/19 1306   09/19/19 1030  vancomycin (VANCOCIN) 2,000 mg in sodium chloride 0.9 % 500 mL IVPB     2,000 mg 250 mL/hr over 120 Minutes Intravenous  Once 09/19/19 1022 09/19/19 1525   09/19/19 1015  metroNIDAZOLE (FLAGYL) IVPB 500 mg     500 mg 100 mL/hr over 60 Minutes Intravenous  Once 09/19/19 1010 09/19/19 1306      Procedures: None  CONSULTS:  vascular surgery  Time spent: 25- minutes-Greater than 50% of this time was spent in counseling, explanation of diagnosis, planning of further management, and coordination of care.  MEDICATIONS: Scheduled Meds:  [MAR Hold] amLODipine  5 mg Oral Daily   [MAR Hold] aspirin EC  81 mg Oral Daily   [MAR Hold] heparin  5,000 Units Subcutaneous Q8H   [MAR Hold] insulin aspart  0-15 Units Subcutaneous Q6H   [MAR Hold] insulin aspart  0-5 Units Subcutaneous QHS   [MAR Hold] nebivolol  10 mg Oral Daily   [MAR Hold] nystatin cream   Topical BID   [MAR Hold] sodium chloride flush  3 mL Intravenous Q12H   Continuous Infusions:  [MAR Hold] cefTRIAXone (ROCEPHIN)  IV 1 g (09/21/19 2247)   dextrose 50 mL/hr at  09/21/19 1235   lactated ringers 10 mL/hr at 09/22/19 1138   [MAR Hold] vancomycin 1,250 mg (09/21/19 1428)   PRN Meds:.0.9 % irrigation (POUR BTL), [MAR Hold] acetaminophen **OR** [MAR Hold] acetaminophen, [MAR Hold] albuterol, [MAR Hold] ondansetron **OR** [MAR Hold] ondansetron (ZOFRAN) IV   PHYSICAL EXAM: Vital signs: Vitals:   09/21/19 2227 09/22/19 0429 09/22/19 0820 09/22/19 1120  BP: (!) 152/81 136/60 134/62   Pulse: 67 64 69   Resp:  18 18   Temp:  97.8 F (36.6 C) 97.9 F (36.6 C)   TempSrc:   Oral   SpO2:  94% 98%   Weight:    127 kg  Height:    5' 7.99" (1.727 m)   Filed Weights   09/19/19 1014 09/22/19 1120  Weight: 127 kg 127 kg   Body mass index is 42.58 kg/m.  Gen Exam:Alert awake-not in any distress HEENT:atraumatic, normocephalic Chest: B/L clear to auscultation anteriorly CVS:S1S2 regular Abdomen:soft non tender, non distended Extremities:no edema Neurology: Non focal Skin: no rash   I have personally reviewed following labs and imaging studies  LABORATORY DATA: CBC: Recent Labs  Lab 09/19/19 0855 09/20/19 0600 09/21/19 0650 09/22/19 0417  WBC 15.5* 15.7* 12.0* 12.0*  NEUTROABS 13.4*  --   --   --   HGB 18.6* 18.3* 16.4* 15.3*  HCT 58.8* 59.1* 50.4* 46.8*  MCV 91.0 94.9 88.9 89.0  PLT 186 120* 148* 145*    Basic Metabolic Panel: Recent Labs  Lab 09/19/19 1537 09/20/19 0510 09/20/19 0640 09/21/19 0650 09/22/19 0417  NA 155* 119* 153* 145 141  K 2.7* 2.8* 3.9 3.4* 3.0*  CL 123* 91* 118* 112* 109  CO2 23 15* 18* 20* 22  GLUCOSE 194* 1,196* 267* 230* 204*  BUN 90* 58* 75* 51* 32*  CREATININE 1.52* 1.11* 1.27* 1.15* 0.90  CALCIUM 7.8* 6.7* 8.8* 8.5* 8.3*  MG  --   --   --  2.5*  --     GFR: Estimated Creatinine Clearance: 81.8 mL/min (by C-G formula based on SCr of 0.9 mg/dL).  Liver Function Tests: Recent Labs  Lab 09/19/19 0855 09/20/19 0510 09/20/19 0640 09/21/19 0650 09/22/19 0417  AST 61* 49* 68* 43* 33  ALT  36 28 38 33 31  ALKPHOS 74 47 66 60 61  BILITOT 1.4* 1.4* 2.0* 1.5* 1.1  PROT 7.6 4.8* 6.2* 5.6* 5.4*  ALBUMIN 3.5 2.0* 2.6* 2.3* 2.2*   No results for input(s): LIPASE, AMYLASE in the last 168 hours. No results for input(s): AMMONIA in the last 168 hours.  Coagulation Profile: No results for input(s): INR, PROTIME in the last 168 hours.  Cardiac Enzymes: Recent Labs  Lab 09/19/19 0855 09/20/19 0510 09/20/19 0640 09/22/19 0417  CKTOTAL 1,495* 1,219* 1,672* 510*    BNP (last 3 results) No results for input(s): PROBNP in the last 8760 hours.  HbA1C: No results for input(s): HGBA1C in the last 72 hours.  CBG: Recent Labs  Lab 09/21/19 2219 09/22/19 0007 09/22/19 0613 09/22/19 0717 09/22/19 1041  GLUCAP 205* 198* 178* 177* 150*    Lipid Profile: No results for input(s): CHOL, HDL, LDLCALC, TRIG, CHOLHDL, LDLDIRECT in the last 72 hours.  Thyroid Function Tests: No results for input(s): TSH, T4TOTAL, FREET4, T3FREE, THYROIDAB in the last 72 hours.  Anemia Panel: No results for input(s): VITAMINB12, FOLATE, FERRITIN, TIBC, IRON, RETICCTPCT in the last 72 hours.  Urine analysis:    Component Value Date/Time   COLORURINE YELLOW 09/19/2019 1200   APPEARANCEUR CLOUDY (A) 09/19/2019 1200   LABSPEC 1.018 09/19/2019 1200   PHURINE 5.0 09/19/2019 1200   GLUCOSEU 150 (A) 09/19/2019 1200   HGBUR MODERATE (A) 09/19/2019 1200   BILIRUBINUR NEGATIVE 09/19/2019 1200   KETONESUR NEGATIVE 09/19/2019 1200   PROTEINUR 100 (A) 09/19/2019 1200   NITRITE NEGATIVE 09/19/2019 1200   LEUKOCYTESUR LARGE (A) 09/19/2019 1200    Sepsis Labs: Lactic Acid, Venous    Component Value Date/Time   LATICACIDVEN 1.7 09/19/2019 1537    MICROBIOLOGY: Recent Results (from the past 240 hour(s))  Blood culture (routine x 2)     Status: None (Preliminary result)   Collection Time: 09/19/19  8:56 AM   Specimen: BLOOD RIGHT HAND  Result Value Ref Range Status   Specimen Description BLOOD  RIGHT HAND  Final   Special Requests   Final    BOTTLES  DRAWN AEROBIC AND ANAEROBIC Blood Culture results may not be optimal due to an inadequate volume of blood received in culture bottles   Culture   Final    NO GROWTH 3 DAYS Performed at [facility], [address] [place], [place] [address]    Report Status PENDING  Incomplete  SARS Coronavirus 2 [facility] order, Performed in [facility] hospital lab) Nasopharyngeal Nasopharyngeal Swab     Status: None   Collection Time: 09/19/19  9:06 AM   Specimen: Nasopharyngeal Swab  Result Value Ref Range Status   SARS Coronavirus 2 NEGATIVE NEGATIVE Final    Comment: (NOTE) If result is NEGATIVE SARS-CoV-2 target nucleic acids are NOT DETECTED. The SARS-CoV-2 RNA is generally detectable in upper and lower  respiratory specimens during the acute phase of infection. The lowest  concentration of SARS-CoV-2 viral copies this assay can detect is 250  copies / mL. A negative result does not preclude SARS-CoV-2 infection  and should not be used as the sole basis for treatment or other  patient management decisions.  A negative result may occur with  improper specimen collection / handling, submission of specimen other  than nasopharyngeal swab, presence of viral mutation(s) within the  areas targeted by this assay, and inadequate number of viral copies  (<250 copies / mL). A negative result must be combined with clinical  observations, patient history, and epidemiological information. If result is POSITIVE SARS-CoV-2 target nucleic acids are DETECTED. The SARS-CoV-2 RNA is generally detectable in upper and lower  respiratory specimens dur ing the acute phase of infection.  Positive  results are indicative of active infection with SARS-CoV-2.  Clinical  correlation with patient history and other diagnostic information is  necessary to determine patient infection status.  Positive results do  not rule out bacterial infection or  co-infection with other viruses. If result is PRESUMPTIVE POSTIVE SARS-CoV-2 nucleic acids MAY BE PRESENT.   A presumptive positive result was obtained on the submitted specimen  and confirmed on repeat testing.  While 2019 novel coronavirus  (SARS-CoV-2) nucleic acids may be present in the submitted sample  additional confirmatory testing may be necessary for epidemiological  and / or clinical management purposes  to differentiate between  SARS-CoV-2 and other Sarbecovirus currently known to infect humans.  If clinically indicated additional testing with an alternate test  methodology [phone]) is advised. The SARS-CoV-2 RNA is generally  detectable in upper and lower respiratory sp ecimens during the acute  phase of infection. The expected result is Negative. Fact Sheet for Patients:  BoilerBrush.com.cy Fact Sheet for Healthcare Providers: https://pope.com/ This test is not yet approved or cleared by the [place] FDA and has been authorized for detection and/or diagnosis of SARS-CoV-2 by FDA under an Emergency Use Authorization (EUA).  This EUA will remain in effect (meaning this test can be used) for the duration of the COVID-19 declaration under Section 564(b)(1) of the Act, 21 U.S.C. section 360bbb-3(b)(1), unless the authorization is terminated or revoked sooner. Performed at [facility], [address], [place], [place] [address]   Blood culture (routine x 2)     Status: None (Preliminary result)   Collection Time: 09/19/19 11:42 AM   Specimen: BLOOD  Result Value Ref Range Status   Specimen Description BLOOD LEFT ANTECUBITAL  Final   Special Requests   Final    BOTTLES DRAWN AEROBIC ONLY Blood Culture results may not be optimal due to an inadequate volume of blood received in culture  bottles   Culture   Final    NO GROWTH 3 DAYS Performed at [facility], [place] [address], [place], [place] [address]     Report Status PENDING  Incomplete  Urine culture     Status: Abnormal   Collection Time: 09/19/19 12:15 PM   Specimen: Urine, Clean Catch  Result Value Ref Range Status   Specimen Description URINE, CLEAN CATCH  Final   Special Requests   Final    NONE Performed at [facility], [place] [address] [place], [place] [address]    Culture MULTIPLE SPECIES PRESENT, SUGGEST RECOLLECTION (A)  Final   Report Status 09/20/2019 FINAL  Final  Surgical pcr screen     Status: Abnormal   Collection Time: 09/22/19  9:52 AM   Specimen: Nasal Mucosa; Nasal Swab  Result Value Ref Range Status   MRSA, PCR NEGATIVE NEGATIVE Final   Staphylococcus aureus POSITIVE (A) NEGATIVE Final    Comment: (NOTE) The Xpert SA Assay (FDA approved for NASAL specimens in patients 50 years of age and older), is one component of a comprehensive surveillance program. It is not intended to diagnose infection nor to guide or monitor treatment. Performed at [facility], [place] [address], [place], [place] [address]     RADIOLOGY STUDIES/RESULTS: Dg Pelvis 1-2 Views  Result Date: 09/19/2019 CLINICAL DATA:  Pain after fall EXAM: PELVIS - 1-2 VIEW COMPARISON:  None. FINDINGS: There is no evidence of pelvic fracture or diastasis. No pelvic bone lesions are seen. IMPRESSION: Negative. Electronically Signed   By: Dorise Bullion III M.D   On: 09/19/2019 10:22   Ct Head Wo Contrast  Result Date: 09/19/2019 CLINICAL DATA:  Headache. EXAM: CT HEAD WITHOUT CONTRAST CT CERVICAL SPINE WITHOUT CONTRAST TECHNIQUE: Multidetector CT imaging of the head and cervical spine was performed following the standard protocol without intravenous contrast. Multiplanar CT image reconstructions of the cervical spine were also generated. COMPARISON:  None. FINDINGS: CT HEAD FINDINGS Brain: No evidence of acute infarction, hemorrhage, hydrocephalus, extra-axial collection or mass lesion/mass effect. There is mild diffuse low-attenuation within the  subcortical and periventricular white matter compatible with chronic microvascular disease. Enlargement of the sulci and ventricles compatible with brain atrophy. Vascular: No hyperdense vessel or unexpected calcification. Skull: Normal. Negative for fracture or focal lesion. Sinuses/Orbits: No acute finding. Other: None. CT CERVICAL SPINE FINDINGS Alignment: Normal. Skull base and vertebrae: No acute fracture. No primary bone lesion or focal pathologic process. Soft tissues and spinal canal: No prevertebral fluid or swelling. No visible canal hematoma. Disc levels: Marked multi level disc space narrowing and endplate spurring. Upper chest: Negative. Other: None IMPRESSION: 1. No acute intracranial abnormality. 2. Chronic small vessel ischemic change and brain atrophy. 3. No evidence for cervical spine fracture. 4. Cervical degenerative disc disease. Electronically Signed   By: Kerby Moors M.D.   On: 09/19/2019 09:52   Ct Cervical Spine Wo Contrast  Result Date: 09/19/2019 CLINICAL DATA:  Headache. EXAM: CT HEAD WITHOUT CONTRAST CT CERVICAL SPINE WITHOUT CONTRAST TECHNIQUE: Multidetector CT imaging of the head and cervical spine was performed following the standard protocol without intravenous contrast. Multiplanar CT image reconstructions of the cervical spine were also generated. COMPARISON:  None. FINDINGS: CT HEAD FINDINGS Brain: No evidence of acute infarction, hemorrhage, hydrocephalus, extra-axial collection or mass lesion/mass effect. There is mild diffuse low-attenuation within the subcortical and periventricular white matter compatible with chronic microvascular disease. Enlargement of the sulci and ventricles compatible with brain atrophy. Vascular:  No hyperdense vessel or unexpected calcification. Skull: Normal. Negative for fracture or focal lesion. Sinuses/Orbits: No acute finding. Other: None. CT CERVICAL SPINE FINDINGS Alignment: Normal. Skull base and vertebrae: No acute fracture. No primary  bone lesion or focal pathologic process. Soft tissues and spinal canal: No prevertebral fluid or swelling. No visible canal hematoma. Disc levels: Marked multi level disc space narrowing and endplate spurring. Upper chest: Negative. Other: None IMPRESSION: 1. No acute intracranial abnormality. 2. Chronic small vessel ischemic change and brain atrophy. 3. No evidence for cervical spine fracture. 4. Cervical degenerative disc disease. Electronically Signed   By: Signa Kell M.D.   On: 09/19/2019 09:52   Dg Chest Portable 1 View  Result Date: 09/19/2019 CLINICAL DATA:  Pain after fall EXAM: PORTABLE CHEST 1 VIEW COMPARISON:  None. FINDINGS: There is a tortuous thoracic aorta. The heart, hila, mediastinum, lungs, and pleura are otherwise unremarkable. IMPRESSION: No active disease. Electronically Signed   By: Gerome Sam III M.D   On: 09/19/2019 10:33   Dg Tibia/fibula Left Port  Result Date: 09/19/2019 CLINICAL DATA:  Pain after fall EXAM: PORTABLE LEFT TIBIA AND FIBULA - 2 VIEW COMPARISON:  None. FINDINGS: Degenerative changes in the knee.  No fractures. IMPRESSION: No fractures. Electronically Signed   By: Gerome Sam III M.D   On: 09/19/2019 10:23   Dg Hand Complete Right  Result Date: 09/19/2019 CLINICAL DATA:  Pain after fall EXAM: RIGHT HAND - COMPLETE 3+ VIEW COMPARISON:  None. FINDINGS: Known usual configuration of the distal radius is likely due to remote healed trauma. No acute fracture noted. IMPRESSION: No acute fractures are seen. Electronically Signed   By: Gerome Sam III M.D   On: 09/19/2019 10:25   Dg Foot Complete Left  Result Date: 09/19/2019 CLINICAL DATA:  Pain after fall EXAM: LEFT FOOT - COMPLETE 3+ VIEW COMPARISON:  None. FINDINGS: There is no evidence of fracture or dislocation. There is no evidence of arthropathy or other focal bone abnormality. Soft tissues are unremarkable. IMPRESSION: Negative. Electronically Signed   By: Gerome Sam III M.D   On:  09/19/2019 10:28   Dg Foot Complete Right  Result Date: 09/19/2019 CLINICAL DATA:  Pain after fall EXAM: RIGHT FOOT COMPLETE - 3+ VIEW COMPARISON:  None. FINDINGS: Severe soft tissue swelling in the foot, particularly along the top of the foot. No fractures, dislocations, or bony erosion. IMPRESSION: Soft tissue swelling.  No other acute abnormalities. Electronically Signed   By: Gerome Sam III M.D   On: 09/19/2019 10:26   Vas [place] Abi With/wo Tbi  Result Date: 09/21/2019 LOWER EXTREMITY DOPPLER STUDY Indications: Ulceration, and Ischemic lower extremity.  Comparison Study: No prvious study available Performing Technologist: Milta Deiters, [place] [facility]  Examination Guidelines: A complete evaluation includes at minimum, Doppler waveform signals and systolic blood pressure reading at the level of bilateral brachial, anterior tibial, and posterior tibial arteries, when vessel segments are accessible. Bilateral testing is considered an integral part of a complete examination. Photoelectric Plethysmograph (PPG) waveforms and toe systolic pressure readings are included as required and additional duplex testing as needed. Limited examinations for reoccurring indications may be performed as noted.  ABI Findings: +---------+------------------+-----+---------+--------+  Right     Rt Pressure (mmHg) Index Waveform  Comment   +---------+------------------+-----+---------+--------+  Brachial  152                      triphasic           +---------+------------------+-----+---------+--------+  PTA  150                0.97  triphasic           +---------+------------------+-----+---------+--------+  DP        179                1.15  triphasic           +---------+------------------+-----+---------+--------+  Great Toe 124                0.80                      +---------+------------------+-----+---------+--------+ +---------+------------------+-----+---------+-------+  Left      Lt Pressure (mmHg) Index Waveform   Comment  +---------+------------------+-----+---------+-------+  Brachial  155                      triphasic          +---------+------------------+-----+---------+-------+  PTA       132                0.85  biphasic           +---------+------------------+-----+---------+-------+  DP        159                1.03  biphasic           +---------+------------------+-----+---------+-------+  Great Toe 255                1.65                     +---------+------------------+-----+---------+-------+ +-------+-----------+-----------+------------+------------+  ABI/TBI Today's ABI Today's TBI Previous ABI Previous TBI  +-------+-----------+-----------+------------+------------+  Right   1.15        0.80                                   +-------+-----------+-----------+------------+------------+  Left    1.03        calcified                              +-------+-----------+-----------+------------+------------+ No comparison available  Summary: Right: Resting right ankle-brachial index is within normal range. No evidence of significant right lower extremity arterial disease. The right toe-brachial index is normal. Left: Resting left ankle-brachial index is within normal range. No evidence of significant left lower extremity arterial disease. The left toe-brachial index is abnormal. TBI is non compressible consistent with calcification.  *See table(s) above for measurements and observations.  Electronically signed by Lemar Livings MD on 09/21/2019 at 2:13:38 PM.   Final    Dg Femur Portable 1 View Left  Result Date: 09/19/2019 CLINICAL DATA:  Pain after fall EXAM: LEFT FEMUR PORTABLE 1 VIEW COMPARISON:  None. FINDINGS: Degenerative changes are seen in the knee.  No fractures are seen. IMPRESSION: Negative. Electronically Signed   By: Gerome Sam III M.D   On: 09/19/2019 10:32   Dg Femur Portable 1 View Right  Result Date: 09/19/2019 CLINICAL DATA:  Pain after fall EXAM: RIGHT FEMUR PORTABLE 1 VIEW  COMPARISON:  None. FINDINGS: Degenerative changes in the knee.  No fractures seen in the femur. IMPRESSION: No fractures identified. Electronically Signed   By: Gerome Sam III M.D   On: 09/19/2019 10:30     LOS: 3 days   Jeoffrey Massed,  MD  [facility]  If 7PM-7AM, please contact night-coverage  Please page via [email or web address]  Go to [email or web address] and use [facility]'s universal password to access. If you do not have the password, please contact the hospital operator.  Locate the [facility] provider you are looking for under [facility] and page to a number that you can be directly reached. If you still have difficulty reaching the provider, please page the [facility] (Director on Call) for the Hospitalists listed on amion for assistance.  09/22/2019, 1:53 PM

## 2019-09-23 ENCOUNTER — Encounter (HOSPITAL_COMMUNITY): Payer: Self-pay | Admitting: Vascular Surgery

## 2019-09-23 LAB — BASIC METABOLIC PANEL
Anion gap: 9 (ref 5–15)
BUN: 25 mg/dL — ABNORMAL HIGH (ref 8–23)
CO2: 20 mmol/L — ABNORMAL LOW (ref 22–32)
Calcium: 7.9 mg/dL — ABNORMAL LOW (ref 8.9–10.3)
Chloride: 109 mmol/L (ref 98–111)
Creatinine, Ser: 0.83 mg/dL (ref 0.44–1.00)
GFR calc Af Amer: >60 mL/min (ref >60)
GFR calc non Af Amer: >60 mL/min (ref >60)
Glucose, Bld: 209 mg/dL — ABNORMAL HIGH (ref 70–99)
Potassium: 3.8 mmol/L (ref 3.5–5.1)
Sodium: 138 mmol/L (ref 135–145)

## 2019-09-23 LAB — CBC
HCT: 42.6 % (ref 36.0–46.0)
Hemoglobin: 13.8 g/dL (ref 12.0–15.0)
MCH: 28.4 pg (ref 26.0–34.0)
MCHC: 32.4 g/dL (ref 30.0–36.0)
MCV: 87.7 fL (ref 80.0–100.0)
Platelets: 163 10*3/uL (ref 150–400)
RBC: 4.86 MIL/uL (ref 3.87–5.11)
RDW: 13.4 % (ref 11.5–15.5)
WBC: 12 10*3/uL — ABNORMAL HIGH (ref 4.0–10.5)
nRBC: 0 % (ref 0.0–0.2)

## 2019-09-23 LAB — GLUCOSE, CAPILLARY
Glucose-Capillary: 184 mg/dL — ABNORMAL HIGH (ref 70–99)
Glucose-Capillary: 194 mg/dL — ABNORMAL HIGH (ref 70–99)
Glucose-Capillary: 198 mg/dL — ABNORMAL HIGH (ref 70–99)
Glucose-Capillary: 248 mg/dL — ABNORMAL HIGH (ref 70–99)

## 2019-09-23 MED ORDER — FUROSEMIDE 40 MG PO TABS
40.0000 mg | ORAL_TABLET | Freq: Every day | ORAL | Status: DC
  Administered 2019-09-23 – 2019-09-27 (×5): 40 mg via ORAL
  Filled 2019-09-23 (×5): qty 1

## 2019-09-23 MED ORDER — TRAMADOL HCL 50 MG PO TABS
50.0000 mg | ORAL_TABLET | Freq: Once | ORAL | Status: AC
  Administered 2019-09-23: 50 mg via ORAL
  Filled 2019-09-23: qty 1

## 2019-09-23 NOTE — Evaluation (Signed)
Physical Therapy Evaluation Patient Details Name: Loretta [name] MRN: [id number] DOB: 1946-07-25 Today's Date: 09/23/2019   History of Present Illness  73 y.o. female with medical history significant of hypertension, hyperlipidemia, and diabetes mellitus type 2; who presents after being found down at her ILF with altered mental status. Admitted 09/19/19 with sepsis secondary to urinary tract infection and/or pressure ulcers with cellulitis; encephalopathy; mild rhabdomyolysis; AKI; She was also found to have numerous ulcerations in the lower extremity some necrotic ulcerations and underwent amputation of the left third, fourth and fifth toes on 10/2.  Clinical Impression   Pt admitted with above diagnosis. Patient confused during session and cooperative. Determined she was going to walk into the bathroom, however she could not even stand from EOB. Significant decline since pandemic began and her acitities have been curtailed. Pt currently with functional limitations due to the deficits listed below (see PT Problem List). Pt will benefit from skilled PT to increase their independence and safety with mobility to allow discharge to the venue listed below.       Follow Up Recommendations SNF;Supervision/Assistance - 24 hour    Equipment Recommendations  None recommended by PT    Recommendations for Other Services OT consult     Precautions / Restrictions Precautions Precautions: Fall;Other (comment) Required Braces or Orthoses: Other Brace Other Brace: use darco shoe for heel walking on left Restrictions Other Position/Activity Restrictions: weight bear thru lt heel in Darco shoe      Mobility  Bed Mobility Overal bed mobility: Needs Assistance Bed Mobility: Rolling;Supine to Sit;Sit to Supine Rolling: Modified independent (Device/Increase time)   Supine to sit: Min guard;HOB elevated Sit to supine: Min assist   General bed mobility comments: +2 assist to scoot up to  [facility]  Transfers                 General transfer comment: attempted sit to stand x 5 (pt determined she would stand, but ultimately agreed to return to supine  Ambulation/Gait                Stairs            Wheelchair Mobility    Modified Rankin (Stroke Patients Only)       Balance Overall balance assessment: History of Falls                                           Pertinent Vitals/Pain Pain Assessment: No/denies pain    Home Living Family/patient expects to be discharged to:: Skilled nursing facility                 Additional Comments: per MD notes, son wants SNF    Prior Function Level of Independence: Independent with assistive device(s)         Comments: prior to pandemic was doing her own shopping "I loved to shop" and swimmng for exercise     Hand Dominance   Dominant Hand: Right    Extremity/Trunk Assessment   Upper Extremity Assessment Upper Extremity Assessment: Generalized weakness    Lower Extremity Assessment Lower Extremity Assessment: RLE deficits/detail;LLE deficits/detail RLE Deficits / Details: wrapped at calf and ankle due to wounds; hip flexion 2+, hip/knee extension 2+ (was unable to clear buttocks off side of bed trying to stand) LLE Deficits / Details: wrapped from foot to calf; hip flexion 2+, hip/knee extension 2+ (was  unable to clear buttocks off side of bed trying to stand)    Cervical / Trunk Assessment Cervical / Trunk Assessment: Other exceptions Cervical / Trunk Exceptions: morbid obesity  Communication   Communication: No difficulties  Cognition Arousal/Alertness: Awake/alert Behavior During Therapy: Anxious Overall Cognitive Status: No family/caregiver present to determine baseline cognitive functioning                                 General Comments: "Why are you asking me these questions?" 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 and  then begins thinking that she is at work      [employer] comments (skin integrity, edema, etc.): Cognition improved after sitting EOB and observing her surroundings and 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, site     Assessment/Plan    PT Assessment Patient needs continued PT services  PT Problem List Decreased strength;Decreased range of motion;Decreased activity tolerance;Decreased balance;Decreased mobility;Decreased cognition;Decreased knowledge of use of DME;Decreased safety awareness;Decreased knowledge of precautions;Obesity       PT Treatment Interventions DME instruction;Gait training;Functional mobility training;Therapeutic activities;Therapeutic exercise;Balance training;Cognitive remediation;Patient/family education    PT Goals (Current goals can be found in the Care Plan section)  Acute Rehab PT Goals Patient Stated Goal: to not be a burden on her son; to get stronger/better PT Goal Formulation: With patient Time For Goal Achievement: 10/07/19 Potential to Achieve Goals: Fair    Frequency Min 2X/week   Barriers to discharge Decreased caregiver support      Co-evaluation               AM-PAC PT "6 Clicks" Mobility  Outcome Measure Help needed turning from your back to your side while in a flat bed without using bedrails?: None Help needed moving from lying on your back to sitting on the side of a flat bed without using bedrails?: A Little Help needed moving to and from a bed to a chair (including a wheelchair)?: Total Help needed standing up from a chair using your arms (e.g., wheelchair or bedside chair)?: Total Help needed to walk in hospital room?: Total Help needed climbing 3-5 steps with a railing? : Total 6 Click Score: 11    End of Session Equipment Utilized During Treatment: Other (comment)(left Darco shoe) Activity Tolerance: Patient limited by fatigue Patient left: in bed;with call bell/phone within reach;with bed alarm  set Nurse Communication: Mobility status PT Visit Diagnosis: Repeated falls (R29.6);Muscle weakness (generalized) (M62.81)    Time: 1330-1401 PT Time Calculation (min) (ACUTE ONLY): 31 min   Charges:   PT Evaluation $PT Eval Moderate Complexity: 1 Mod PT Treatments $Therapeutic Activity: 8-22 mins          Barry Brunner, PT      Loretta [name] 09/23/2019, 2:32 PM

## 2019-09-23 NOTE — Plan of Care (Signed)
  Problem: Safety: Goal: Ability to remain free from injury will improve Outcome: Progressing   Problem: Skin Integrity: Goal: Risk for impaired skin integrity will decrease Outcome: Progressing   

## 2019-09-23 NOTE — Progress Notes (Signed)
Orthopedic Tech Progress Note Patient Details:  Loretta [name] 1946/05/17 [id number]  Ortho Devices Type of Ortho Device: Darco shoe Ortho Device/Splint Location: left Ortho Device/Splint Interventions: Application   Post Interventions Patient Tolerated: Well Instructions Provided: Care of device   [name] 09/23/2019, 1:24 PM

## 2019-09-23 NOTE — Progress Notes (Signed)
PROGRESS NOTE        PATIENT DETAILS Name: Loretta [name] Age: 73 y.o. Sex: female Date of Birth: August 31, 1946 Admit Date: 09/19/2019 Admitting Physician Norval Morton, MD YJE:HUDJS, Thayer Jew, MD  Brief Narrative: Patient is a 73 y.o. female with past medical history of HTN, dyslipidemia, DM-2-who was found down at her independent living facility-she was confused-she was brought to the hospital for further evaluation, where she was found to have hyponatremia, AKI and rhabdomyolysis.  She was also found to have numerous ulcerations in the lower extremity some necrotic ulcerations on the left third, fourth and fifth toes.  With supportive care-her electrolyte abnormalities improved-she was evaluated by vascular surgery and underwent amputation of the left third, fourth and fifth toes on 10/2.  See below for further details.  Subjective: He is much more awake and alert compared to the past few days.  Does not have any pain at the operative site.  No major events overnight.  Assessment/Plan: Sepsis secondary to UTI and numerous lower extremity ulceration with some surrounding cellulitis: Sepsis pathophysiology has resolved-all cultures remain negative.  Patient underwent amputation of the left third, fourth, fifth toe on 10/2.  Suspect we can now discontinue all antimicrobial therapy.  Continue local wound care.  AKI: Likely hemodynamically mediated-resolved with supportive care.  Hypernatremia: Secondary to dehydration/poor oral intake-sodium levels have now normalized with D5W-continue to follow electrolytes periodically.    Rhabdomyolysis: Secondary to patient being down on the floor properly for several days-CK levels have markedly improved.  Follow periodically.  Acute metabolic encephalopathy: Suspect secondary to AKI and hyponatremia.  CT head without acute abnormalities.  Encephalopathy has resolved-she is awake and alert and I suspect back to her baseline.    Per patient's son-she probably is on a autistic spectrum.    Numerous bilateral lower extremity ulceration-including gangrenous changes to left third, fourth and fifth toes: Vascular surgery following with tentative plans for amputation of the third, fourth and fifth toes on 10/2.    DM-2: CBGs relatively stable-continue SSI  HTN: Controlled-continue manidipine and Bystolic.  Continue to hold Benicar  Dyslipidemia: Hold statins  Chronic lower extremity edema: Volume status is relatively stable-since her AKI and hyponatremia has resolved-stable to resume diuretics.    Chronic debility/deconditioning: Claims to walk with either a walker or cane-Per patient's son-ever since the pandemic broke out-patient has not been participating in her usual swimming/other activities as usual.  Suspect she may have developed deconditioning over the past few months.  Awaiting PT/OT eval-but given that the patient was found down at her independent living facility for several days-suspect she will require SNF on discharge.  Obesity: Estimated body mass index is 42.58 kg/m as calculated from the following:   Height as of this encounter: 5' 7.99" (1.727 m).   Weight as of this encounter: 127 kg.     Diet: Diet Order            Diet heart healthy/carb modified Room service appropriate? Yes; Fluid consistency: Thin  Diet effective now               DVT Prophylaxis: Prophylactic Heparin  Code Status: Full code  Family Communication: Son over the phone on 10/3  Disposition Plan: Remain inpatient-probably will require SNF on discharge  Antimicrobial agents: Anti-infectives (From admission, onward)   Start     Dose/Rate Route Frequency  Ordered Stop   09/21/19 1200  vancomycin (VANCOCIN) 1,500 mg in sodium chloride 0.9 % 500 mL IVPB  Status:  Discontinued     1,500 mg 250 mL/hr over 120 Minutes Intravenous Every 48 hours 09/19/19 1110 09/20/19 0907   09/20/19 2230  cefTRIAXone (ROCEPHIN) 1 g in  sodium chloride 0.9 % 100 mL IVPB  Status:  Discontinued     1 g 200 mL/hr over 30 Minutes Intravenous Every 24 hours 09/19/19 1909 09/23/19 0823   09/20/19 1400  vancomycin (VANCOCIN) 1,250 mg in sodium chloride 0.9 % 250 mL IVPB  Status:  Discontinued     1,250 mg 166.7 mL/hr over 90 Minutes Intravenous Every 24 hours 09/20/19 0907 09/23/19 0823   09/19/19 2330  ceFEPIme (MAXIPIME) 2 g in sodium chloride 0.9 % 100 mL IVPB  Status:  Discontinued     2 g 200 mL/hr over 30 Minutes Intravenous Every 12 hours 09/19/19 1110 09/19/19 1909   09/19/19 2230  ceFEPIme (MAXIPIME) 2 g in sodium chloride 0.9 % 100 mL IVPB     2 g 200 mL/hr over 30 Minutes Intravenous  Once 09/19/19 1909 09/20/19 0005   09/19/19 1030  ceFEPIme (MAXIPIME) 2 g in sodium chloride 0.9 % 100 mL IVPB     2 g 200 mL/hr over 30 Minutes Intravenous  Once 09/19/19 1022 09/19/19 1306   09/19/19 1030  vancomycin (VANCOCIN) 2,000 mg in sodium chloride 0.9 % 500 mL IVPB     2,000 mg 250 mL/hr over 120 Minutes Intravenous  Once 09/19/19 1022 09/19/19 1525   09/19/19 1015  metroNIDAZOLE (FLAGYL) IVPB 500 mg     500 mg 100 mL/hr over 60 Minutes Intravenous  Once 09/19/19 1010 09/19/19 1306      Procedures: 10/2>>Transmetatarsal amputation of toes 3 4 and 5 left foot  CONSULTS:  vascular surgery  Time spent: 25- minutes-Greater than 50% of this time was spent in counseling, explanation of diagnosis, planning of further management, and coordination of care.  MEDICATIONS: Scheduled Meds:  amLODipine  5 mg Oral Daily   aspirin EC  81 mg Oral Daily   Chlorhexidine Gluconate Cloth  6 each Topical Daily   heparin  5,000 Units Subcutaneous Q8H   insulin aspart  0-15 Units Subcutaneous Q6H   insulin aspart  0-5 Units Subcutaneous QHS   mupirocin ointment  1 application Nasal BID   nebivolol  10 mg Oral Daily   nystatin cream   Topical BID   sodium chloride flush  3 mL Intravenous Q12H   Continuous Infusions:   dextrose 50 mL/hr at 09/22/19 1552   PRN Meds:.acetaminophen **OR** acetaminophen, albuterol, ondansetron **OR** ondansetron (ZOFRAN) IV   PHYSICAL EXAM: Vital signs: Vitals:   09/22/19 1548 09/22/19 2100 09/23/19 0525 09/23/19 0925  BP: (!) 128/52 (!) 121/58 (!) 145/54 (!) 130/51  Pulse: 66 78 65 65  Resp: Temp: 98.2 F (36.8 C) 98.3 F (36.8 C) 98.6 F (37 C) 98.2 F (36.8 C)  TempSrc:  Oral Oral Oral  SpO2: 96% 90% 94% 95%  Weight:      Height:       Filed Weights   09/19/19 1014 09/22/19 1120  Weight: 127 kg 127 kg   Body mass index is 42.58 kg/m.   Gen Exam:Alert awake-not in any distress HEENT:atraumatic, normocephalic Chest: B/L clear to auscultation anteriorly CVS:S1S2 regular Abdomen:soft non tender, non distended Extremities:no edema Neurology: Non focal Skin: no rash  I have personally reviewed following labs  and imaging studies  LABORATORY DATA: CBC: Recent Labs  Lab 09/19/19 0855 09/20/19 0600 09/21/19 0650 09/22/19 0417 09/23/19 0350  WBC 15.5* 15.7* 12.0* 12.0* 12.0*  NEUTROABS 13.4*  --   --   --   --   HGB 18.6* 18.3* 16.4* 15.3* 13.8  HCT 58.8* 59.1* 50.4* 46.8* 42.6  MCV 91.0 94.9 88.9 89.0 87.7  PLT 186 120* 148* 145* 163    Basic Metabolic Panel: Recent Labs  Lab 09/20/19 0510 09/20/19 0640 09/21/19 0650 09/22/19 0417 09/23/19 0350  NA 119* 153* 145 141 138  K 2.8* 3.9 3.4* 3.0* 3.8  CL 91* 118* 112* 109 109  CO2 15* 18* 20* 22 20*  GLUCOSE 1,196* 267* 230* 204* 209*  BUN 58* 75* 51* 32* 25*  CREATININE 1.11* 1.27* 1.15* 0.90 0.83  CALCIUM 6.7* 8.8* 8.5* 8.3* 7.9*  MG  --   --  2.5*  --   --     GFR: Estimated Creatinine Clearance: 88.7 mL/min (by C-G formula based on SCr of 0.83 mg/dL).  Liver Function Tests: Recent Labs  Lab 09/19/19 0855 09/20/19 0510 09/20/19 0640 09/21/19 0650 09/22/19 0417  AST 61* 49* 68* 43* 33  ALT 36 28 38 33 31  ALKPHOS 74 47 66 60 61  BILITOT 1.4* 1.4* 2.0* 1.5* 1.1   PROT 7.6 4.8* 6.2* 5.6* 5.4*  ALBUMIN 3.5 2.0* 2.6* 2.3* 2.2*   No results for input(s): LIPASE, AMYLASE in the last 168 hours. No results for input(s): AMMONIA in the last 168 hours.  Coagulation Profile: No results for input(s): INR, PROTIME in the last 168 hours.  Cardiac Enzymes: Recent Labs  Lab 09/19/19 0855 09/20/19 0510 09/20/19 0640 09/22/19 0417  CKTOTAL 1,495* 1,219* 1,672* 510*    BNP (last 3 results) No results for input(s): PROBNP in the last 8760 hours.  HbA1C: No results for input(s): HGBA1C in the last 72 hours.  CBG: Recent Labs  Lab 09/22/19 1456 09/22/19 1735 09/22/19 2138 09/23/19 0006 09/23/19 0549  GLUCAP 167* 219* 302* 248* 184*    Lipid Profile: No results for input(s): CHOL, HDL, LDLCALC, TRIG, CHOLHDL, LDLDIRECT in the last 72 hours.  Thyroid Function Tests: No results for input(s): TSH, T4TOTAL, FREET4, T3FREE, THYROIDAB in the last 72 hours.  Anemia Panel: No results for input(s): VITAMINB12, FOLATE, FERRITIN, TIBC, IRON, RETICCTPCT in the last 72 hours.  Urine analysis:    Component Value Date/Time   COLORURINE YELLOW 09/19/2019 1200   APPEARANCEUR CLOUDY (A) 09/19/2019 1200   LABSPEC 1.018 09/19/2019 1200   PHURINE 5.0 09/19/2019 1200   GLUCOSEU 150 (A) 09/19/2019 1200   HGBUR MODERATE (A) 09/19/2019 1200   BILIRUBINUR NEGATIVE 09/19/2019 1200   KETONESUR NEGATIVE 09/19/2019 1200   PROTEINUR 100 (A) 09/19/2019 1200   NITRITE NEGATIVE 09/19/2019 1200   LEUKOCYTESUR LARGE (A) 09/19/2019 1200    Sepsis Labs: Lactic Acid, Venous    Component Value Date/Time   LATICACIDVEN 1.7 09/19/2019 1537    MICROBIOLOGY: Recent Results (from the past 240 hour(s))  Blood culture (routine x 2)     Status: None (Preliminary result)   Collection Time: 09/19/19  8:56 AM   Specimen: BLOOD RIGHT HAND  Result Value Ref Range Status   Specimen Description BLOOD RIGHT HAND  Final   Special Requests   Final    BOTTLES DRAWN AEROBIC AND  ANAEROBIC Blood Culture results may not be optimal due to an inadequate volume of blood received in culture bottles   Culture  Final    NO GROWTH 3 DAYS Performed at [facility], [address] [place], [place] [address]    Report Status PENDING  Incomplete  SARS Coronavirus 2 [facility] order, Performed in [facility] hospital lab) Nasopharyngeal Nasopharyngeal Swab     Status: None   Collection Time: 09/19/19  9:06 AM   Specimen: Nasopharyngeal Swab  Result Value Ref Range Status   SARS Coronavirus 2 NEGATIVE NEGATIVE Final    Comment: (NOTE) If result is NEGATIVE SARS-CoV-2 target nucleic acids are NOT DETECTED. The SARS-CoV-2 RNA is generally detectable in upper and lower  respiratory specimens during the acute phase of infection. The lowest  concentration of SARS-CoV-2 viral copies this assay can detect is 250  copies / mL. A negative result does not preclude SARS-CoV-2 infection  and should not be used as the sole basis for treatment or other  patient management decisions.  A negative result may occur with  improper specimen collection / handling, submission of specimen other  than nasopharyngeal swab, presence of viral mutation(s) within the  areas targeted by this assay, and inadequate number of viral copies  (<250 copies / mL). A negative result must be combined with clinical  observations, patient history, and epidemiological information. If result is POSITIVE SARS-CoV-2 target nucleic acids are DETECTED. The SARS-CoV-2 RNA is generally detectable in upper and lower  respiratory specimens dur ing the acute phase of infection.  Positive  results are indicative of active infection with SARS-CoV-2.  Clinical  correlation with patient history and other diagnostic information is  necessary to determine patient infection status.  Positive results do  not rule out bacterial infection or co-infection with other viruses. If result is PRESUMPTIVE POSTIVE SARS-CoV-2 nucleic  acids MAY BE PRESENT.   A presumptive positive result was obtained on the submitted specimen  and confirmed on repeat testing.  While 2019 novel coronavirus  (SARS-CoV-2) nucleic acids may be present in the submitted sample  additional confirmatory testing may be necessary for epidemiological  and / or clinical management purposes  to differentiate between  SARS-CoV-2 and other Sarbecovirus currently known to infect humans.  If clinically indicated additional testing with an alternate test  methodology [phone]) is advised. The SARS-CoV-2 RNA is generally  detectable in upper and lower respiratory sp ecimens during the acute  phase of infection. The expected result is Negative. Fact Sheet for Patients:  BoilerBrush.com.cy Fact Sheet for Healthcare Providers: https://pope.com/ This test is not yet approved or cleared by the [place] FDA and has been authorized for detection and/or diagnosis of SARS-CoV-2 by FDA under an Emergency Use Authorization (EUA).  This EUA will remain in effect (meaning this test can be used) for the duration of the COVID-19 declaration under Section 564(b)(1) of the Act, 21 U.S.C. section 360bbb-3(b)(1), unless the authorization is terminated or revoked sooner. Performed at [facility], [address], [place], [place] [address]   Blood culture (routine x 2)     Status: None (Preliminary result)   Collection Time: 09/19/19 11:42 AM   Specimen: BLOOD  Result Value Ref Range Status   Specimen Description BLOOD LEFT ANTECUBITAL  Final   Special Requests   Final    BOTTLES DRAWN AEROBIC ONLY Blood Culture results may not be optimal due to an inadequate volume of blood received in culture bottles   Culture   Final    NO GROWTH 3 DAYS Performed at [facility], [address], [place], [place] [address]  Report Status PENDING  Incomplete  Urine culture     Status: Abnormal   Collection Time:  09/19/19 12:15 PM   Specimen: Urine, Clean Catch  Result Value Ref Range Status   Specimen Description URINE, CLEAN CATCH  Final   Special Requests   Final    NONE Performed at [facility], [address], [place], [place] [address]    Culture MULTIPLE SPECIES PRESENT, SUGGEST RECOLLECTION (A)  Final   Report Status 09/20/2019 FINAL  Final  Surgical pcr screen     Status: Abnormal   Collection Time: 09/22/19  9:52 AM   Specimen: Nasal Mucosa; Nasal Swab  Result Value Ref Range Status   MRSA, PCR NEGATIVE NEGATIVE Final   Staphylococcus aureus POSITIVE (A) NEGATIVE Final    Comment: (NOTE) The Xpert SA Assay (FDA approved for NASAL specimens in patients 66 years of age and older), is one component of a comprehensive surveillance program. It is not intended to diagnose infection nor to guide or monitor treatment. Performed at [facility], [address] [place], [place] [address]     RADIOLOGY STUDIES/RESULTS: Dg Pelvis 1-2 Views  Result Date: 09/19/2019 CLINICAL DATA:  Pain after fall EXAM: PELVIS - 1-2 VIEW COMPARISON:  None. FINDINGS: There is no evidence of pelvic fracture or diastasis. No pelvic bone lesions are seen. IMPRESSION: Negative. Electronically Signed   By: Gerome Sam III M.D   On: 09/19/2019 10:22   Ct Head Wo Contrast  Result Date: 09/19/2019 CLINICAL DATA:  Headache. EXAM: CT HEAD WITHOUT CONTRAST CT CERVICAL SPINE WITHOUT CONTRAST TECHNIQUE: Multidetector CT imaging of the head and cervical spine was performed following the standard protocol without intravenous contrast. Multiplanar CT image reconstructions of the cervical spine were also generated. COMPARISON:  None. FINDINGS: CT HEAD FINDINGS Brain: No evidence of acute infarction, hemorrhage, hydrocephalus, extra-axial collection or mass lesion/mass effect. There is mild diffuse low-attenuation within the subcortical and periventricular white matter compatible with chronic microvascular  disease. Enlargement of the sulci and ventricles compatible with brain atrophy. Vascular: No hyperdense vessel or unexpected calcification. Skull: Normal. Negative for fracture or focal lesion. Sinuses/Orbits: No acute finding. Other: None. CT CERVICAL SPINE FINDINGS Alignment: Normal. Skull base and vertebrae: No acute fracture. No primary bone lesion or focal pathologic process. Soft tissues and spinal canal: No prevertebral fluid or swelling. No visible canal hematoma. Disc levels: Marked multi level disc space narrowing and endplate spurring. Upper chest: Negative. Other: None IMPRESSION: 1. No acute intracranial abnormality. 2. Chronic small vessel ischemic change and brain atrophy. 3. No evidence for cervical spine fracture. 4. Cervical degenerative disc disease. Electronically Signed   By: Signa Kell M.D.   On: 09/19/2019 09:52   Ct Cervical Spine Wo Contrast  Result Date: 09/19/2019 CLINICAL DATA:  Headache. EXAM: CT HEAD WITHOUT CONTRAST CT CERVICAL SPINE WITHOUT CONTRAST TECHNIQUE: Multidetector CT imaging of the head and cervical spine was performed following the standard protocol without intravenous contrast. Multiplanar CT image reconstructions of the cervical spine were also generated. COMPARISON:  None. FINDINGS: CT HEAD FINDINGS Brain: No evidence of acute infarction, hemorrhage, hydrocephalus, extra-axial collection or mass lesion/mass effect. There is mild diffuse low-attenuation within the subcortical and periventricular white matter compatible with chronic microvascular disease. Enlargement of the sulci and ventricles compatible with brain atrophy. Vascular: No hyperdense vessel or unexpected calcification. Skull: Normal. Negative for fracture or focal lesion. Sinuses/Orbits: No acute finding. Other: None. CT CERVICAL SPINE FINDINGS Alignment: Normal. Skull base and vertebrae:  No acute fracture. No primary bone lesion or focal pathologic process. Soft tissues and spinal canal: No  prevertebral fluid or swelling. No visible canal hematoma. Disc levels: Marked multi level disc space narrowing and endplate spurring. Upper chest: Negative. Other: None IMPRESSION: 1. No acute intracranial abnormality. 2. Chronic small vessel ischemic change and brain atrophy. 3. No evidence for cervical spine fracture. 4. Cervical degenerative disc disease. Electronically Signed   By: Signa Kell M.D.   On: 09/19/2019 09:52   Dg Chest Portable 1 View  Result Date: 09/19/2019 CLINICAL DATA:  Pain after fall EXAM: PORTABLE CHEST 1 VIEW COMPARISON:  None. FINDINGS: There is a tortuous thoracic aorta. The heart, hila, mediastinum, lungs, and pleura are otherwise unremarkable. IMPRESSION: No active disease. Electronically Signed   By: Gerome Sam III M.D   On: 09/19/2019 10:33   Dg Tibia/fibula Left Port  Result Date: 09/19/2019 CLINICAL DATA:  Pain after fall EXAM: PORTABLE LEFT TIBIA AND FIBULA - 2 VIEW COMPARISON:  None. FINDINGS: Degenerative changes in the knee.  No fractures. IMPRESSION: No fractures. Electronically Signed   By: Gerome Sam III M.D   On: 09/19/2019 10:23   Dg Hand Complete Right  Result Date: 09/19/2019 CLINICAL DATA:  Pain after fall EXAM: RIGHT HAND - COMPLETE 3+ VIEW COMPARISON:  None. FINDINGS: Known usual configuration of the distal radius is likely due to remote healed trauma. No acute fracture noted. IMPRESSION: No acute fractures are seen. Electronically Signed   By: Gerome Sam III M.D   On: 09/19/2019 10:25   Dg Foot Complete Left  Result Date: 09/19/2019 CLINICAL DATA:  Pain after fall EXAM: LEFT FOOT - COMPLETE 3+ VIEW COMPARISON:  None. FINDINGS: There is no evidence of fracture or dislocation. There is no evidence of arthropathy or other focal bone abnormality. Soft tissues are unremarkable. IMPRESSION: Negative. Electronically Signed   By: Gerome Sam III M.D   On: 09/19/2019 10:28   Dg Foot Complete Right  Result Date: 09/19/2019 CLINICAL  DATA:  Pain after fall EXAM: RIGHT FOOT COMPLETE - 3+ VIEW COMPARISON:  None. FINDINGS: Severe soft tissue swelling in the foot, particularly along the top of the foot. No fractures, dislocations, or bony erosion. IMPRESSION: Soft tissue swelling.  No other acute abnormalities. Electronically Signed   By: Gerome Sam III M.D   On: 09/19/2019 10:26   Vas [place] Abi With/wo Tbi  Result Date: 09/21/2019 LOWER EXTREMITY DOPPLER STUDY Indications: Ulceration, and Ischemic lower extremity.  Comparison Study: No prvious study available Performing Technologist: Milta Deiters, [place] [facility]  Examination Guidelines: A complete evaluation includes at minimum, Doppler waveform signals and systolic blood pressure reading at the level of bilateral brachial, anterior tibial, and posterior tibial arteries, when vessel segments are accessible. Bilateral testing is considered an integral part of a complete examination. Photoelectric Plethysmograph (PPG) waveforms and toe systolic pressure readings are included as required and additional duplex testing as needed. Limited examinations for reoccurring indications may be performed as noted.  ABI Findings: +---------+------------------+-----+---------+--------+  Right     Rt Pressure (mmHg) Index Waveform  Comment   +---------+------------------+-----+---------+--------+  Brachial  152                      triphasic           +---------+------------------+-----+---------+--------+  PTA       150                0.97  triphasic           +---------+------------------+-----+---------+--------+  DP        179                1.15  triphasic           +---------+------------------+-----+---------+--------+  Great Toe 124                0.80                      +---------+------------------+-----+---------+--------+ +---------+------------------+-----+---------+-------+  Left      Lt Pressure (mmHg) Index Waveform  Comment  +---------+------------------+-----+---------+-------+  Brachial  155                       triphasic          +---------+------------------+-----+---------+-------+  PTA       132                0.85  biphasic           +---------+------------------+-----+---------+-------+  DP        159                1.03  biphasic           +---------+------------------+-----+---------+-------+  Great Toe 255                1.65                     +---------+------------------+-----+---------+-------+ +-------+-----------+-----------+------------+------------+  ABI/TBI Today's ABI Today's TBI Previous ABI Previous TBI  +-------+-----------+-----------+------------+------------+  Right   1.15        0.80                                   +-------+-----------+-----------+------------+------------+  Left    1.03        calcified                              +-------+-----------+-----------+------------+------------+ No comparison available  Summary: Right: Resting right ankle-brachial index is within normal range. No evidence of significant right lower extremity arterial disease. The right toe-brachial index is normal. Left: Resting left ankle-brachial index is within normal range. No evidence of significant left lower extremity arterial disease. The left toe-brachial index is abnormal. TBI is non compressible consistent with calcification.  *See table(s) above for measurements and observations.  Electronically signed by Lemar Livings MD on 09/21/2019 at 2:13:38 PM.   Final    Dg Femur Portable 1 View Left  Result Date: 09/19/2019 CLINICAL DATA:  Pain after fall EXAM: LEFT FEMUR PORTABLE 1 VIEW COMPARISON:  None. FINDINGS: Degenerative changes are seen in the knee.  No fractures are seen. IMPRESSION: Negative. Electronically Signed   By: Gerome Sam III M.D   On: 09/19/2019 10:32   Dg Femur Portable 1 View Right  Result Date: 09/19/2019 CLINICAL DATA:  Pain after fall EXAM: RIGHT FEMUR PORTABLE 1 VIEW COMPARISON:  None. FINDINGS: Degenerative changes in the knee.  No fractures seen in the  femur. IMPRESSION: No fractures identified. Electronically Signed   By: Gerome Sam III M.D   On: 09/19/2019 10:30     LOS: 4 days   Jeoffrey Massed, MD  [facility]  If 7PM-7AM, please contact night-coverage  Please page via [email or web address]  Go to [email or web address] and use [place]'s universal password to access. If you do not  have the password, please contact the hospital operator.  Locate the [facility] provider you are looking for under [facility] and page to a number that you can be directly reached. If you still have difficulty reaching the provider, please page the [facility] (Director on Call) for the Hospitalists listed on amion for assistance.  09/23/2019, 10:57 AM

## 2019-09-23 NOTE — Progress Notes (Signed)
Orthopedic Tech Progress Note Patient Details:  Loretta [name] 1946/11/19 [id number]  Patient ID: Loretta [name], female   DOB: May 09, 1946, 73 y.o.   MRN: [id number]   Loretta [name] 09/23/2019, 11:23 AMCalled [employer] for left Heel shoe .

## 2019-09-23 NOTE — Plan of Care (Signed)
°  Problem: Respiratory: °Goal: Ability to maintain adequate ventilation will improve °Outcome: Progressing °  °

## 2019-09-23 NOTE — Progress Notes (Signed)
Right leg dressings have been changed and sacral foam changed. Patient tolerated well.   Farley Ly RN

## 2019-09-23 NOTE — Progress Notes (Addendum)
  Progress Note    09/23/2019 9:56 AM 1 Day Post-Op  Subjective:  Emotional and frustrated about need for toe amputations   Vitals:   09/23/19 0525 09/23/19 0925  BP: (!) 145/54 (!) 130/51  Pulse: 65 65  Resp: 18 18  Temp: 98.6 F (37 C) 98.2 F (36.8 C)  SpO2: 94% 95%   Physical Exam: Lungs:  Non labored Incisions:  Dressing left in place LLE, no breakthrough bleeding Abdomen:  Soft Neurologic: A&O  CBC    Component Value Date/Time   WBC 12.0 (H) 09/23/2019 0350   RBC 4.86 09/23/2019 0350   HGB 13.8 09/23/2019 0350   HCT 42.6 09/23/2019 0350   PLT 163 09/23/2019 0350   MCV 87.7 09/23/2019 0350   MCH 28.4 09/23/2019 0350   MCHC 32.4 09/23/2019 0350   RDW 13.4 09/23/2019 0350   LYMPHSABS 1.0 09/19/2019 0855   MONOABS 1.0 09/19/2019 0855   EOSABS 0.0 09/19/2019 0855   BASOSABS 0.0 09/19/2019 0855    BMET    Component Value Date/Time   NA 138 09/23/2019 0350   K 3.8 09/23/2019 0350   CL 109 09/23/2019 0350   CO2 20 (L) 09/23/2019 0350   GLUCOSE 209 (H) 09/23/2019 0350   BUN 25 (H) 09/23/2019 0350   CREATININE 0.83 09/23/2019 0350   CALCIUM 7.9 (L) 09/23/2019 0350   GFRNONAA >60 09/23/2019 0350   GFRAA >60 09/23/2019 0350    INR No results found for: INR   Intake/Output Summary (Last 24 hours) at 09/23/2019 0956 Last data filed at 09/23/2019 0600 Gross per 24 hour  Intake 2305.6 ml  Output 1360 ml  Net 945.6 ml     Assessment/Plan:  73 y.o. female is s/p tramet amp of toes 3,4,5 1 Day Post-Op   Dressing change tomorrow Patient at risk for non healing amp site despite palpable L ATA given extent of tissue loss Heel shoe ordered for heel weightbearing only   Dagoberto Ligas, PA-C [facility] [phone] 09/23/2019 9:56 AM  I have seen and evaluated the patient. I agree with the PA note as documented above. POD#1 s/p left foot debridement and removal of toes 3-5.  Will remove dressing tomorrow.  Marty Heck, MD  [facility] [place] Office: [phone] Pager: [phone]

## 2019-09-24 LAB — GLUCOSE, CAPILLARY
Glucose-Capillary: 159 mg/dL — ABNORMAL HIGH (ref 70–99)
Glucose-Capillary: 164 mg/dL — ABNORMAL HIGH (ref 70–99)
Glucose-Capillary: 168 mg/dL — ABNORMAL HIGH (ref 70–99)
Glucose-Capillary: 174 mg/dL — ABNORMAL HIGH (ref 70–99)
Glucose-Capillary: 183 mg/dL — ABNORMAL HIGH (ref 70–99)

## 2019-09-24 LAB — BASIC METABOLIC PANEL
Anion gap: 11 (ref 5–15)
BUN: 19 mg/dL (ref 8–23)
CO2: 20 mmol/L — ABNORMAL LOW (ref 22–32)
Calcium: 7.9 mg/dL — ABNORMAL LOW (ref 8.9–10.3)
Chloride: 107 mmol/L (ref 98–111)
Creatinine, Ser: 0.76 mg/dL (ref 0.44–1.00)
GFR calc Af Amer: >60 mL/min (ref >60)
GFR calc non Af Amer: >60 mL/min (ref >60)
Glucose, Bld: 185 mg/dL — ABNORMAL HIGH (ref 70–99)
Potassium: 4.3 mmol/L (ref 3.5–5.1)
Sodium: 138 mmol/L (ref 135–145)

## 2019-09-24 LAB — CULTURE, BLOOD (ROUTINE X 2)
Culture: NO GROWTH
Culture: NO GROWTH

## 2019-09-24 LAB — CK: Total CK: 220 U/L (ref 38–234)

## 2019-09-24 NOTE — Progress Notes (Signed)
CSW called [facility] in [place], [place]. [place]: [phone]. CSW left a voicemail with the admissions director, Adrienne Mocha. Awaiting a return phone call regarding bed availability.   CSW will continue to follow and assist with disposition planning.   Domenic Schwab, MSW, [place] Worker [facility]  [phone]

## 2019-09-24 NOTE — Plan of Care (Signed)
  Problem: Fluid Volume: Goal: Hemodynamic stability will improve Outcome: Progressing   

## 2019-09-24 NOTE — Plan of Care (Signed)
  Problem: Health Behavior/Discharge Planning: Goal: Ability to manage health-related needs will improve Outcome: Progressing   

## 2019-09-24 NOTE — NC FL2 (Signed)
[place] LEVEL OF CARE SCREENING TOOL     IDENTIFICATION  Patient Name: Loretta [name] Birthdate: 1946/12/03 Sex: female Admission Date (Current Location): 09/19/2019  [facility] and [place] Number:  Herbalist and Address:  [facility] [place], [place] [address] [place], [place] [address]      Provider Number: [id number]  Attending Physician Name and Address:  Jonetta Osgood, MD  Relative Name and Phone Number:  Jennetta, Flood, [phone]    Current Level of Care: Hospital Recommended Level of Care: [place] Prior Approval Number:    Date Approved/Denied:   PASRR Number:    Discharge Plan: SNF    Current Diagnoses: Patient Active Problem List   Diagnosis Date Noted  . Pressure injury of skin 09/21/2019  . Sepsis ([place]) 09/19/2019  . Cellulitis 09/19/2019  . Rhabdomyolysis 09/19/2019  . Acute lower UTI 09/19/2019  . Bradycardia 09/17/2017  . AKI (acute kidney injury) ([place]) 09/16/2017  . Closed right ankle fracture 01/04/2017  . Displaced fracture of lateral malleolus of right fibula, subsequent encounter for closed fracture with routine healing   . Bimalleolar ankle fracture 12/15/2016  . DM2 (diabetes mellitus, type 2) ([place]) 12/15/2016  . HTN (hypertension) 12/15/2016  . Hypokalemia 12/15/2016  . Hypernatremia 12/15/2016    Orientation RESPIRATION BLADDER Height & Weight     Self, Situation, Place  Normal Incontinent, External catheter Weight: 274 lb (124.3 kg) Height:  5' 7.99" (172.7 cm)  BEHAVIORAL SYMPTOMS/MOOD NEUROLOGICAL BOWEL NUTRITION STATUS      Continent Diet(regluar diet, thin liquids)  AMBULATORY STATUS COMMUNICATION OF NEEDS Skin   Limited Assist Verbally Skin abrasions, Bruising, Other (Comment)(cellulitis on right/left leg; bruising on arm/foot/buttocks; MASD on sacrum/groin; blister on throat; pressure injury on foot/leg/toe/open wound on right/left leg (Change dressing for each 2x  daily))                       Personal Care Assistance Level of Assistance  Bathing, Feeding, Dressing, Total care Bathing Assistance: Limited assistance Feeding assistance: Independent Dressing Assistance: Limited assistance Total Care Assistance: Limited assistance   Functional Limitations Info  Sight, Hearing, Speech Sight Info: Impaired(Wears glasses) Hearing Info: Adequate Speech Info: Adequate    SPECIAL CARE FACTORS FREQUENCY  PT (By licensed PT), OT (By licensed OT)     PT Frequency: 5x/wk OT Frequency: 5x/wk            Contractures Contractures Info: Not present    Additional Factors Info  Code Status, Allergies, Insulin Sliding Scale Code Status Info: Full Code Allergies Info: Pravastatin   Insulin Sliding Scale Info: insulin aspart novolog 0-15 units every 6 hours and insulin aspart novolog 0-5 units at bedtime       Current Medications (09/24/2019):  This is the current hospital active medication list Current Facility-Administered Medications  Medication Dose Route Frequency Provider Last Rate Last Dose  . acetaminophen (TYLENOL) tablet 650 mg  650 mg Oral Q6H PRN Elam Dutch, MD       Or  . acetaminophen (TYLENOL) suppository 650 mg  650 mg Rectal Q6H PRN Elam Dutch, MD      . albuterol (PROVENTIL) (2.5 MG/3ML) 0.083% nebulizer solution 2.5 mg  2.5 mg Nebulization Q6H PRN Elam Dutch, MD      . amLODipine (NORVASC) tablet 5 mg  5 mg Oral Daily Elam Dutch, MD   5 mg at 09/24/19 0347  . aspirin EC tablet  81 mg  81 mg Oral Daily Sherren Kerns, MD   81 mg at 09/24/19 2229  . Chlorhexidine Gluconate Cloth 2 % PADS 6 each  6 each Topical Daily Sherren Kerns, MD   6 each at 09/24/19 [phone]  . dextrose 5 % solution   Intravenous Continuous Sherren Kerns, MD 50 mL/hr at 09/24/19 0513    . furosemide (LASIX) tablet 40 mg  40 mg Oral Daily Maretta Bees, MD   40 mg at 09/24/19 0903  . heparin injection 5,000 Units  5,000  Units Subcutaneous Q8H Sherren Kerns, MD   5,000 Units at 09/24/19 1258  . insulin aspart (novoLOG) injection 0-15 Units  0-15 Units Subcutaneous Q6H Sherren Kerns, MD   3 Units at 09/24/19 1142  . insulin aspart (novoLOG) injection 0-5 Units  0-5 Units Subcutaneous QHS Sherren Kerns, MD   4 Units at 09/22/19 2205  . mupirocin ointment (BACTROBAN) 2 % 1 application  1 application Nasal BID Sherren Kerns, MD   1 application at 09/24/19 0902  . nebivolol (BYSTOLIC) tablet 10 mg  10 mg Oral Daily Sherren Kerns, MD   10 mg at 09/24/19 2119  . nystatin cream (MYCOSTATIN)   Topical BID Sherren Kerns, MD      . ondansetron [facility]) tablet 4 mg  4 mg Oral Q6H PRN Sherren Kerns, MD       Or  . ondansetron [facility]) injection 4 mg  4 mg Intravenous Q6H PRN Sherren Kerns, MD      . sodium chloride flush (NS) 0.9 % injection 3 mL  3 mL Intravenous Q12H Sherren Kerns, MD   3 mL at 09/24/19 0902     Discharge Medications: Please see discharge summary for a list of discharge medications.  Relevant Imaging Results:  Relevant Lab Results:   Additional Information SSN: [id number]  Nada Boozer Kaylub Detienne, LCSWA

## 2019-09-24 NOTE — Progress Notes (Signed)
Changed patients sacral, left upper leg pink foam and the middle of her back pink foam. Patient tolerated well.    Farley Ly RN

## 2019-09-24 NOTE — Progress Notes (Signed)
[facility] [place]  Subjective  -seems confused.   Objective (!) 148/59 64 97.7 F (36.5 C) (Oral) 18 97%  Intake/Output Summary (Last 24 hours) at 09/24/2019 1110 Last data filed at 09/24/2019 0900 Gross per 24 hour  Intake 1858.41 ml  Output 2250 ml  Net -391.59 ml    Left transmetatarsal amputation appears to be healing appropriately.  No signs of cellulitis or necrosis at this time.   Palpable left dorsalis pedis pulse.  Laboratory Lab Results: Recent Labs    09/22/19 0417 09/23/19 0350  WBC 12.0* 12.0*  HGB 15.3* 13.8  HCT 46.8* 42.6  PLT 145* 163   BMET Recent Labs    09/23/19 0350 09/24/19 0339  NA 138 138  K 3.8 4.3  CL 109 107  CO2 20* 20*  GLUCOSE 209* 185*  BUN 25* 19  CREATININE 0.83 0.76  CALCIUM 7.9* 7.9*    COAG No results found for: INR, PROTIME No results found for: PTT  Assessment/Planning:  Dressing change to left transmetatarsal amputation after removal of toes 3 4 and 5 on Friday.  The wound appears to be healing appropriately.  There is no sign of necrosis at this time.  She has a palpable dorsalis pedis in the left foot.  Can continue dressing changes daily with Xeroform and Kerlix and Ace.  Marty Heck 09/24/2019 11:10 AM --

## 2019-09-24 NOTE — Progress Notes (Addendum)
PROGRESS NOTE        PATIENT DETAILS Name: Loretta [name] Age: 73 y.o. Sex: female Date of Birth: 12/12/1946 Admit Date: 09/19/2019 Admitting Physician Clydie Braun, MD ZOX:[id number], Zollie Beckers, MD  Brief Narrative: Patient is a 73 y.o. female with past medical history of HTN, dyslipidemia, DM-2-who was found down at her independent living facility-she was confused-she was brought to the hospital for further evaluation, where she was found to have hyponatremia, AKI and rhabdomyolysis.  She was also found to have numerous ulcerations in the lower extremity some necrotic ulcerations on the left third, fourth and fifth toes.  With supportive care-her electrolyte abnormalities improved-she was evaluated by vascular surgery and underwent amputation of the left third, fourth and fifth toes on 10/2.  See below for further details.  Subjective: Awake-alert-no major events overnight.  Not interested in breakfast this morning.  Per nursing staff-no major events overnight.  Assessment/Plan: Sepsis secondary to UTI and numerous lower extremity ulceration with some surrounding cellulitis: Sepsis pathophysiology has resolved-all cultures negative-patient is s/p left third, fourth and fifth toe amputation on 10/2.  All antimicrobial agents have been discontinued.  Continue local wound care.    AKI: Likely hemodynamically mediated-resolved with supportive care.  Hypernatremia: Secondary to dehydration/poor oral intake-sodium levels have now normalized with D5W-continue to follow electrolytes periodically.    Rhabdomyolysis: Secondary to patient being down on the floor properly for several days-CK levels have markedly improved.  Follow periodically.  Acute metabolic encephalopathy: Suspect secondary to AKI and hypernatremia.  CT head without acute abnormalities.  Encephalopathy has resolved-she is awake and alert and I suspect back to her baseline.   Per patient's son-she probably is  on a autistic spectrum.    Numerous bilateral lower extremity ulceration-including gangrenous changes to left third, fourth and fifth toes: Continue local wound care-patient is s/p amputation of the third/fourth and fifth toes on the left foot on 10/2.    DM-2: CBGs relatively stable-continue SSI  HTN: BP controlled-continue with amlodipine and Bystolic.  Continue to hold Benicar for now.  Follow and adjust.   Dyslipidemia: Hold statins  Chronic lower extremity edema: Volume status stable-since AKI and sodium levels are now stable-Lasix resumed. .    Chronic debility/deconditioning: Claims to walk with either a walker or cane-Per patient's son-ever since the pandemic broke out-patient has not been participating in her usual swimming/other activities as usual.  Suspect she may have developed deconditioning over the past few months.  PT evaluation completed-for SNF over the next few days.  Will attempt to talk with social work/case management today.  Obesity: Estimated body mass index is 41.67 kg/m as calculated from the following:   Height as of this encounter: 5' 7.99" (1.727 m).   Weight as of this encounter: 124.3 kg.     Diet: Diet Order            Diet heart healthy/carb modified Room service appropriate? Yes; Fluid consistency: Thin  Diet effective now               DVT Prophylaxis: Prophylactic Heparin  Code Status: Full code  Family Communication: Son over the phone on 10/3-we will update once I speak with case management.  Disposition Plan: Remain inpatient-probably will require SNF on discharge COVID PCR reordered on 10/4 in anticipation of SNF discharge over the next few days.  Antimicrobial agents:  Anti-infectives (From admission, onward)   Start     Dose/Rate Route Frequency Ordered Stop   09/21/19 1200  vancomycin (VANCOCIN) 1,500 mg in sodium chloride 0.9 % 500 mL IVPB  Status:  Discontinued     1,500 mg 250 mL/hr over 120 Minutes Intravenous Every 48  hours 09/19/19 1110 09/20/19 0907   09/20/19 2230  cefTRIAXone (ROCEPHIN) 1 g in sodium chloride 0.9 % 100 mL IVPB  Status:  Discontinued     1 g 200 mL/hr over 30 Minutes Intravenous Every 24 hours 09/19/19 1909 09/23/19 0823   09/20/19 1400  vancomycin (VANCOCIN) 1,250 mg in sodium chloride 0.9 % 250 mL IVPB  Status:  Discontinued     1,250 mg 166.7 mL/hr over 90 Minutes Intravenous Every 24 hours 09/20/19 0907 09/23/19 0823   09/19/19 2330  ceFEPIme (MAXIPIME) 2 g in sodium chloride 0.9 % 100 mL IVPB  Status:  Discontinued     2 g 200 mL/hr over 30 Minutes Intravenous Every 12 hours 09/19/19 1110 09/19/19 1909   09/19/19 2230  ceFEPIme (MAXIPIME) 2 g in sodium chloride 0.9 % 100 mL IVPB     2 g 200 mL/hr over 30 Minutes Intravenous  Once 09/19/19 1909 09/20/19 0005   09/19/19 1030  ceFEPIme (MAXIPIME) 2 g in sodium chloride 0.9 % 100 mL IVPB     2 g 200 mL/hr over 30 Minutes Intravenous  Once 09/19/19 1022 09/19/19 1306   09/19/19 1030  vancomycin (VANCOCIN) 2,000 mg in sodium chloride 0.9 % 500 mL IVPB     2,000 mg 250 mL/hr over 120 Minutes Intravenous  Once 09/19/19 1022 09/19/19 1525   09/19/19 1015  metroNIDAZOLE (FLAGYL) IVPB 500 mg     500 mg 100 mL/hr over 60 Minutes Intravenous  Once 09/19/19 1010 09/19/19 1306      Procedures: 10/2>>Transmetatarsal amputation of toes 3 4 and 5 left foot  CONSULTS:  vascular surgery  Time spent: 25- minutes-Greater than 50% of this time was spent in counseling, explanation of diagnosis, planning of further management, and coordination of care.  MEDICATIONS: Scheduled Meds:  amLODipine  5 mg Oral Daily   aspirin EC  81 mg Oral Daily   Chlorhexidine Gluconate Cloth  6 each Topical Daily   furosemide  40 mg Oral Daily   heparin  5,000 Units Subcutaneous Q8H   insulin aspart  0-15 Units Subcutaneous Q6H   insulin aspart  0-5 Units Subcutaneous QHS   mupirocin ointment  1 application Nasal BID   nebivolol  10 mg Oral Daily    nystatin cream   Topical BID   sodium chloride flush  3 mL Intravenous Q12H   Continuous Infusions:  dextrose 50 mL/hr at 09/24/19 0513   PRN Meds:.acetaminophen **OR** acetaminophen, albuterol, ondansetron **OR** ondansetron (ZOFRAN) IV   PHYSICAL EXAM: Vital signs: Vitals:   09/23/19 2117 09/24/19 0616 09/24/19 0626 09/24/19 0859  BP: (!) 147/90  (!) 162/72 (!) 148/59  Pulse: 71  67 64  Resp: Temp: 98.8 F (37.1 C)  98.2 F (36.8 C) 97.7 F (36.5 C)  TempSrc: Oral  Oral Oral  SpO2: 99%  93% 97%  Weight:  124.3 kg    Height:       Filed Weights   09/19/19 1014 09/22/19 1120 09/24/19 0616  Weight: 127 kg 127 kg 124.3 kg   Body mass index is 41.67 kg/m.   Gen Exam:Alert awake-not in any distress HEENT:atraumatic, normocephalic Chest: B/L clear to auscultation  anteriorly CVS:S1S2 regular Abdomen:soft non tender, non distended Extremities:no edema Neurology: Non focal Skin: no rash  I have personally reviewed following labs and imaging studies  LABORATORY DATA: CBC: Recent Labs  Lab 09/19/19 0855 09/20/19 0600 09/21/19 0650 09/22/19 0417 09/23/19 0350  WBC 15.5* 15.7* 12.0* 12.0* 12.0*  NEUTROABS 13.4*  --   --   --   --   HGB 18.6* 18.3* 16.4* 15.3* 13.8  HCT 58.8* 59.1* 50.4* 46.8* 42.6  MCV 91.0 94.9 88.9 89.0 87.7  PLT 186 120* 148* 145* 163    Basic Metabolic Panel: Recent Labs  Lab 09/20/19 0640 09/21/19 0650 09/22/19 0417 09/23/19 0350 09/24/19 0339  NA 153* 145 141 138 138  K 3.9 3.4* 3.0* 3.8 4.3  CL 118* 112* 109 109 107  CO2 18* 20* 22 20* 20*  GLUCOSE 267* 230* 204* 209* 185*  BUN 75* 51* 32* 25* 19  CREATININE 1.27* 1.15* 0.90 0.83 0.76  CALCIUM 8.8* 8.5* 8.3* 7.9* 7.9*  MG  --  2.5*  --   --   --     GFR: Estimated Creatinine Clearance: 91 mL/min (by C-G formula based on SCr of 0.76 mg/dL).  Liver Function Tests: Recent Labs  Lab 09/19/19 0855 09/20/19 0510 09/20/19 0640 09/21/19 0650 09/22/19 0417   AST 61* 49* 68* 43* 33  ALT 36 28 38 33 31  ALKPHOS 74 47 66 60 61  BILITOT 1.4* 1.4* 2.0* 1.5* 1.1  PROT 7.6 4.8* 6.2* 5.6* 5.4*  ALBUMIN 3.5 2.0* 2.6* 2.3* 2.2*   No results for input(s): LIPASE, AMYLASE in the last 168 hours. No results for input(s): AMMONIA in the last 168 hours.  Coagulation Profile: No results for input(s): INR, PROTIME in the last 168 hours.  Cardiac Enzymes: Recent Labs  Lab 09/19/19 0855 09/20/19 0510 09/20/19 0640 09/22/19 0417 09/24/19 0339  CKTOTAL 1,495* 1,219* 1,672* 510* 220    BNP (last 3 results) No results for input(s): PROBNP in the last 8760 hours.  HbA1C: No results for input(s): HGBA1C in the last 72 hours.  CBG: Recent Labs  Lab 09/23/19 0549 09/23/19 1133 09/23/19 1704 09/24/19 0003 09/24/19 0600  GLUCAP 184* 198* 194* 183* 174*    Lipid Profile: No results for input(s): CHOL, HDL, LDLCALC, TRIG, CHOLHDL, LDLDIRECT in the last 72 hours.  Thyroid Function Tests: No results for input(s): TSH, T4TOTAL, FREET4, T3FREE, THYROIDAB in the last 72 hours.  Anemia Panel: No results for input(s): VITAMINB12, FOLATE, FERRITIN, TIBC, IRON, RETICCTPCT in the last 72 hours.  Urine analysis:    Component Value Date/Time   COLORURINE YELLOW 09/19/2019 1200   APPEARANCEUR CLOUDY (A) 09/19/2019 1200   LABSPEC 1.018 09/19/2019 1200   PHURINE 5.0 09/19/2019 1200   GLUCOSEU 150 (A) 09/19/2019 1200   HGBUR MODERATE (A) 09/19/2019 1200   BILIRUBINUR NEGATIVE 09/19/2019 1200   KETONESUR NEGATIVE 09/19/2019 1200   PROTEINUR 100 (A) 09/19/2019 1200   NITRITE NEGATIVE 09/19/2019 1200   LEUKOCYTESUR LARGE (A) 09/19/2019 1200    Sepsis Labs: Lactic Acid, Venous    Component Value Date/Time   LATICACIDVEN 1.7 09/19/2019 1537    MICROBIOLOGY: Recent Results (from the past 240 hour(s))  Blood culture (routine x 2)     Status: None   Collection Time: 09/19/19  8:56 AM   Specimen: BLOOD RIGHT HAND  Result Value Ref Range Status    Specimen Description BLOOD RIGHT HAND  Final   Special Requests   Final    BOTTLES DRAWN AEROBIC AND  ANAEROBIC Blood Culture results may not be optimal due to an inadequate volume of blood received in culture bottles   Culture   Final    NO GROWTH 5 DAYS Performed at [facility], [address] [place], [place] [address]    Report Status 09/24/2019 FINAL  Final  SARS Coronavirus 2 [facility](Hospital order, Performed in [facility] lab) Nasopharyngeal Nasopharyngeal Swab     Status: None   Collection Time: 09/19/19  9:06 AM   Specimen: Nasopharyngeal Swab  Result Value Ref Range Status   SARS Coronavirus 2 NEGATIVE NEGATIVE Final    Comment: (NOTE) If result is NEGATIVE SARS-CoV-2 target nucleic acids are NOT DETECTED. The SARS-CoV-2 RNA is generally detectable in upper and lower  respiratory specimens during the acute phase of infection. The lowest  concentration of SARS-CoV-2 viral copies this assay can detect is 250  copies / mL. A negative result does not preclude SARS-CoV-2 infection  and should not be used as the sole basis for treatment or other  patient management decisions.  A negative result may occur with  improper specimen collection / handling, submission of specimen other  than nasopharyngeal swab, presence of viral mutation(s) within the  areas targeted by this assay, and inadequate number of viral copies  (<250 copies / mL). A negative result must be combined with clinical  observations, patient history, and epidemiological information. If result is POSITIVE SARS-CoV-2 target nucleic acids are DETECTED. The SARS-CoV-2 RNA is generally detectable in upper and lower  respiratory specimens dur ing the acute phase of infection.  Positive  results are indicative of active infection with SARS-CoV-2.  Clinical  correlation with patient history and other diagnostic information is  necessary to determine patient infection status.  Positive results do  not rule out  bacterial infection or co-infection with other viruses. If result is PRESUMPTIVE POSTIVE SARS-CoV-2 nucleic acids MAY BE PRESENT.   A presumptive positive result was obtained on the submitted specimen  and confirmed on repeat testing.  While 2019 novel coronavirus  (SARS-CoV-2) nucleic acids may be present in the submitted sample  additional confirmatory testing may be necessary for epidemiological  and / or clinical management purposes  to differentiate between  SARS-CoV-2 and other Sarbecovirus currently known to infect humans.  If clinically indicated additional testing with an alternate test  methodology [phone]([id number]) is advised. The SARS-CoV-2 RNA is generally  detectable in upper and lower respiratory sp ecimens during the acute  phase of infection. The expected result is Negative. Fact Sheet for Patients:  BoilerBrush.com.cyhttps://www.fda.gov/media/136312/download Fact Sheet for Healthcare Providers: https://pope.com/https://www.fda.gov/media/136313/download This test is not yet approved or cleared by the [place] FDA and has been authorized for detection and/or diagnosis of SARS-CoV-2 by FDA under an Emergency Use Authorization (EUA).  This EUA will remain in effect (meaning this test can be used) for the duration of the COVID-19 declaration under Section 564(b)(1) of the Act, 21 U.S.C. section 360bbb-3(b)(1), unless the authorization is terminated or revoked sooner. Performed at [facility], [address] [place], [place] [address]   Blood culture (routine x 2)     Status: None   Collection Time: 09/19/19 11:42 AM   Specimen: BLOOD  Result Value Ref Range Status   Specimen Description BLOOD LEFT ANTECUBITAL  Final   Special Requests   Final    BOTTLES DRAWN AEROBIC ONLY Blood Culture results may not be optimal due to an inadequate volume of blood received in culture bottles   Culture  Final    NO GROWTH 5 DAYS Performed at [facility], [address] [place], [place] [address]     Report Status 09/24/2019 FINAL  Final  Urine culture     Status: Abnormal   Collection Time: 09/19/19 12:15 PM   Specimen: Urine, Clean Catch  Result Value Ref Range Status   Specimen Description URINE, CLEAN CATCH  Final   Special Requests   Final    NONE Performed at [facility], [address], [place], [place] [address]    Culture MULTIPLE SPECIES PRESENT, SUGGEST RECOLLECTION (A)  Final   Report Status 09/20/2019 FINAL  Final  Surgical pcr screen     Status: Abnormal   Collection Time: 09/22/19  9:52 AM   Specimen: Nasal Mucosa; Nasal Swab  Result Value Ref Range Status   MRSA, PCR NEGATIVE NEGATIVE Final   Staphylococcus aureus POSITIVE (A) NEGATIVE Final    Comment: (NOTE) The Xpert SA Assay (FDA approved for NASAL specimens in patients 62 years of age and older), is one component of a comprehensive surveillance program. It is not intended to diagnose infection nor to guide or monitor treatment. Performed at [facility], [address] [place], [place] [address]     RADIOLOGY STUDIES/RESULTS: Dg Pelvis 1-2 Views  Result Date: 09/19/2019 CLINICAL DATA:  Pain after fall EXAM: PELVIS - 1-2 VIEW COMPARISON:  None. FINDINGS: There is no evidence of pelvic fracture or diastasis. No pelvic bone lesions are seen. IMPRESSION: Negative. Electronically Signed   By: Gerome Sam III M.D   On: 09/19/2019 10:22   Ct Head Wo Contrast  Result Date: 09/19/2019 CLINICAL DATA:  Headache. EXAM: CT HEAD WITHOUT CONTRAST CT CERVICAL SPINE WITHOUT CONTRAST TECHNIQUE: Multidetector CT imaging of the head and cervical spine was performed following the standard protocol without intravenous contrast. Multiplanar CT image reconstructions of the cervical spine were also generated. COMPARISON:  None. FINDINGS: CT HEAD FINDINGS Brain: No evidence of acute infarction, hemorrhage, hydrocephalus, extra-axial collection or mass lesion/mass effect. There is mild diffuse low-attenuation within  the subcortical and periventricular white matter compatible with chronic microvascular disease. Enlargement of the sulci and ventricles compatible with brain atrophy. Vascular: No hyperdense vessel or unexpected calcification. Skull: Normal. Negative for fracture or focal lesion. Sinuses/Orbits: No acute finding. Other: None. CT CERVICAL SPINE FINDINGS Alignment: Normal. Skull base and vertebrae: No acute fracture. No primary bone lesion or focal pathologic process. Soft tissues and spinal canal: No prevertebral fluid or swelling. No visible canal hematoma. Disc levels: Marked multi level disc space narrowing and endplate spurring. Upper chest: Negative. Other: None IMPRESSION: 1. No acute intracranial abnormality. 2. Chronic small vessel ischemic change and brain atrophy. 3. No evidence for cervical spine fracture. 4. Cervical degenerative disc disease. Electronically Signed   By: Signa Kell M.D.   On: 09/19/2019 09:52   Ct Cervical Spine Wo Contrast  Result Date: 09/19/2019 CLINICAL DATA:  Headache. EXAM: CT HEAD WITHOUT CONTRAST CT CERVICAL SPINE WITHOUT CONTRAST TECHNIQUE: Multidetector CT imaging of the head and cervical spine was performed following the standard protocol without intravenous contrast. Multiplanar CT image reconstructions of the cervical spine were also generated. COMPARISON:  None. FINDINGS: CT HEAD FINDINGS Brain: No evidence of acute infarction, hemorrhage, hydrocephalus, extra-axial collection or mass lesion/mass effect. There is mild diffuse low-attenuation within the subcortical and periventricular white matter compatible with chronic microvascular disease. Enlargement of the sulci and ventricles compatible with brain atrophy. Vascular: No hyperdense vessel or unexpected  calcification. Skull: Normal. Negative for fracture or focal lesion. Sinuses/Orbits: No acute finding. Other: None. CT CERVICAL SPINE FINDINGS Alignment: Normal. Skull base and vertebrae: No acute fracture. No  primary bone lesion or focal pathologic process. Soft tissues and spinal canal: No prevertebral fluid or swelling. No visible canal hematoma. Disc levels: Marked multi level disc space narrowing and endplate spurring. Upper chest: Negative. Other: None IMPRESSION: 1. No acute intracranial abnormality. 2. Chronic small vessel ischemic change and brain atrophy. 3. No evidence for cervical spine fracture. 4. Cervical degenerative disc disease. Electronically Signed   By: Kerby Moors M.D.   On: 09/19/2019 09:52   Dg Chest Portable 1 View  Result Date: 09/19/2019 CLINICAL DATA:  Pain after fall EXAM: PORTABLE CHEST 1 VIEW COMPARISON:  None. FINDINGS: There is a tortuous thoracic aorta. The heart, hila, mediastinum, lungs, and pleura are otherwise unremarkable. IMPRESSION: No active disease. Electronically Signed   By: Dorise Bullion III M.D   On: 09/19/2019 10:33   Dg Tibia/fibula Left Port  Result Date: 09/19/2019 CLINICAL DATA:  Pain after fall EXAM: PORTABLE LEFT TIBIA AND FIBULA - 2 VIEW COMPARISON:  None. FINDINGS: Degenerative changes in the knee.  No fractures. IMPRESSION: No fractures. Electronically Signed   By: Dorise Bullion III M.D   On: 09/19/2019 10:23   Dg Hand Complete Right  Result Date: 09/19/2019 CLINICAL DATA:  Pain after fall EXAM: RIGHT HAND - COMPLETE 3+ VIEW COMPARISON:  None. FINDINGS: Known usual configuration of the distal radius is likely due to remote healed trauma. No acute fracture noted. IMPRESSION: No acute fractures are seen. Electronically Signed   By: Dorise Bullion III M.D   On: 09/19/2019 10:25   Dg Foot Complete Left  Result Date: 09/19/2019 CLINICAL DATA:  Pain after fall EXAM: LEFT FOOT - COMPLETE 3+ VIEW COMPARISON:  None. FINDINGS: There is no evidence of fracture or dislocation. There is no evidence of arthropathy or other focal bone abnormality. Soft tissues are unremarkable. IMPRESSION: Negative. Electronically Signed   By: Dorise Bullion III M.D   On:  09/19/2019 10:28   Dg Foot Complete Right  Result Date: 09/19/2019 CLINICAL DATA:  Pain after fall EXAM: RIGHT FOOT COMPLETE - 3+ VIEW COMPARISON:  None. FINDINGS: Severe soft tissue swelling in the foot, particularly along the top of the foot. No fractures, dislocations, or bony erosion. IMPRESSION: Soft tissue swelling.  No other acute abnormalities. Electronically Signed   By: Dorise Bullion III M.D   On: 09/19/2019 10:26   Vas [place] Abi With/wo Tbi  Result Date: 09/21/2019 LOWER EXTREMITY DOPPLER STUDY Indications: Ulceration, and Ischemic lower extremity.  Comparison Study: No prvious study available Performing Technologist: Birdena Crandall, [place] [facility]  Examination Guidelines: A complete evaluation includes at minimum, Doppler waveform signals and systolic blood pressure reading at the level of bilateral brachial, anterior tibial, and posterior tibial arteries, when vessel segments are accessible. Bilateral testing is considered an integral part of a complete examination. Photoelectric Plethysmograph (PPG) waveforms and toe systolic pressure readings are included as required and additional duplex testing as needed. Limited examinations for reoccurring indications may be performed as noted.  ABI Findings: +---------+------------------+-----+---------+--------+  Right     Rt Pressure (mmHg) Index Waveform  Comment   +---------+------------------+-----+---------+--------+  Brachial  152                      triphasic           +---------+------------------+-----+---------+--------+  PTA  150                0.97  triphasic           +---------+------------------+-----+---------+--------+  DP        179                1.15  triphasic           +---------+------------------+-----+---------+--------+  Great Toe 124                0.80                      +---------+------------------+-----+---------+--------+ +---------+------------------+-----+---------+-------+  Left      Lt Pressure (mmHg) Index Waveform   Comment  +---------+------------------+-----+---------+-------+  Brachial  155                      triphasic          +---------+------------------+-----+---------+-------+  PTA       132                0.85  biphasic           +---------+------------------+-----+---------+-------+  DP        159                1.03  biphasic           +---------+------------------+-----+---------+-------+  Great Toe 255                1.65                     +---------+------------------+-----+---------+-------+ +-------+-----------+-----------+------------+------------+  ABI/TBI Today's ABI Today's TBI Previous ABI Previous TBI  +-------+-----------+-----------+------------+------------+  Right   1.15        0.80                                   +-------+-----------+-----------+------------+------------+  Left    1.03        calcified                              +-------+-----------+-----------+------------+------------+ No comparison available  Summary: Right: Resting right ankle-brachial index is within normal range. No evidence of significant right lower extremity arterial disease. The right toe-brachial index is normal. Left: Resting left ankle-brachial index is within normal range. No evidence of significant left lower extremity arterial disease. The left toe-brachial index is abnormal. TBI is non compressible consistent with calcification.  *See table(s) above for measurements and observations.  Electronically signed by Lemar Livings MD on 09/21/2019 at 2:13:38 PM.   Final    Dg Femur Portable 1 View Left  Result Date: 09/19/2019 CLINICAL DATA:  Pain after fall EXAM: LEFT FEMUR PORTABLE 1 VIEW COMPARISON:  None. FINDINGS: Degenerative changes are seen in the knee.  No fractures are seen. IMPRESSION: Negative. Electronically Signed   By: Gerome Sam III M.D   On: 09/19/2019 10:32   Dg Femur Portable 1 View Right  Result Date: 09/19/2019 CLINICAL DATA:  Pain after fall EXAM: RIGHT FEMUR PORTABLE 1 VIEW  COMPARISON:  None. FINDINGS: Degenerative changes in the knee.  No fractures seen in the femur. IMPRESSION: No fractures identified. Electronically Signed   By: Gerome Sam III M.D   On: 09/19/2019 10:30     LOS: 5 days   Jeoffrey Massed,  MD  [facility]  If 7PM-7AM, please contact night-coverage  Please page via [email or web address]  Go to [email or web address] and use [facility]'s universal password to access. If you do not have the password, please contact the hospital operator.  Locate the [facility] provider you are looking for under [facility] and page to a number that you can be directly reached. If you still have difficulty reaching the provider, please page the [facility] (Director on Call) for the Hospitalists listed on amion for assistance.  09/24/2019, 10:38 AM

## 2019-09-24 NOTE — TOC Initial Note (Signed)
Transition of Care [facility]) - Initial/Assessment Note    Patient Details  Name: Loretta [name] MRN: [id number] Date of Birth: [birth year]  Transition of Care [facility]) CM/SW Contact:    Gwenlyn Fudge, LCSWA Phone Number: 09/24/2019, 1:03 PM  Clinical Narrative:                  CSW called the patient's son, Loretta [name] and completed the assessment. CSW introduced herself, explained her role, and shared the therapy recommendation. Loretta [name] stated that his mother has been to [facility] nursing in [place] for SNF in the past. He stated that he was happy with the care that she received there and would be open for her to return. He asked if he could visit, he would like her closer to him. He stated that he would like his mother to bed faxed to [facility] in [place]. CSW obtained permission to fax the patient out. CSW provided a copy of the CMS List by email.   CSW will continue to follow and assist with disposition planning.   Expected Discharge Plan: Skilled Nursing Facility Barriers to Discharge: Insurance Authorization, Continued Medical Work up   Patient Goals and CMS Choice Patient states their goals for this hospitalization and ongoing recovery are:: Pt son would like his mother to receive rehab before returnig to her ALF CMS Medicare.gov Compare Post Acute Care list provided to:: Patient Represenative (must comment) Choice offered to / list presented to : Adult Children  Expected Discharge Plan and Services Expected Discharge Plan: Skilled Nursing Facility In-house Referral: Clinical Social Work Discharge Planning Services: NA Post Acute Care Choice: Skilled Nursing Facility Living arrangements for the past 2 months: Independent Living Facility                 DME Arranged: N/A DME Agency: NA       HH Arranged: NA HH Agency: NA        Prior Living Arrangements/Services Living arrangements for the past 2 months: Independent Living Facility Lives with:: Facility  Resident Patient language and need for interpreter reviewed:: No Do you feel safe going back to the place where you live?: Yes      Need for Family Participation in Patient Care: Yes (Comment) Care giver support system in place?: Yes (comment)   Criminal Activity/Legal Involvement Pertinent to Current Situation/Hospitalization: No - Comment as needed  Activities of Daily Living      Permission Sought/Granted Permission sought to share information with : Case Manager Permission granted to share information with : Yes, Verbal Permission Granted  Share Information with NAME: Loretta [name]  Permission granted to share info w AGENCY: All SNF  Permission granted to share info w Relationship: Son  Permission granted to share info w Contact Information: [phone]  Emotional Assessment Appearance:: Appears stated age Attitude/Demeanor/Rapport: Unable to Assess Affect (typically observed): Unable to Assess Orientation: : Oriented to Self, Oriented to  Time, Oriented to Place Alcohol / Substance Use: Not Applicable Psych Involvement: No (comment)  Admission diagnosis:  Hypernatremia [E87.0] Fall [W19.XXXA] AKI (acute kidney injury) (HCC) [N17.9] Patient Active Problem List   Diagnosis Date Noted  . Pressure injury of skin 09/21/2019  . Sepsis (HCC) 09/19/2019  . Cellulitis 09/19/2019  . Rhabdomyolysis 09/19/2019  . Acute lower UTI 09/19/2019  . Bradycardia 09/17/2017  . AKI (acute kidney injury) (HCC) 09/16/2017  . Closed right ankle fracture 01/04/2017  . Displaced fracture of lateral malleolus of right fibula, subsequent encounter for closed fracture with routine healing   .  Bimalleolar ankle fracture 12/15/2016  . DM2 (diabetes mellitus, type 2) ([place]) 12/15/2016  . HTN (hypertension) 12/15/2016  . Hypokalemia 12/15/2016  . Hypernatremia 12/15/2016   PCP:  Anda Kraft, MD Pharmacy:   [facility], [place] [address] Phone: [phone] Fax: [phone]     Social Determinants of Health (SDOH) Interventions    Readmission Risk Interventions No flowsheet data found.

## 2019-09-24 NOTE — Progress Notes (Signed)
Changed all patients dressings that needed to be changed. Patient tolerated well.   Farley Ly RN

## 2019-09-25 LAB — GLUCOSE, CAPILLARY
Glucose-Capillary: 163 mg/dL — ABNORMAL HIGH (ref 70–99)
Glucose-Capillary: 163 mg/dL — ABNORMAL HIGH (ref 70–99)
Glucose-Capillary: 165 mg/dL — ABNORMAL HIGH (ref 70–99)
Glucose-Capillary: 177 mg/dL — ABNORMAL HIGH (ref 70–99)
Glucose-Capillary: 179 mg/dL — ABNORMAL HIGH (ref 70–99)
Glucose-Capillary: 182 mg/dL — ABNORMAL HIGH (ref 70–99)

## 2019-09-25 LAB — NOVEL CORONAVIRUS, NAA (HOSP ORDER, SEND-OUT TO REF LAB; TAT 18-24 HRS): SARS-CoV-2, NAA: NOT DETECTED

## 2019-09-25 LAB — SURGICAL PATHOLOGY

## 2019-09-25 NOTE — Evaluation (Signed)
Occupational Therapy Evaluation Patient Details Name: Loretta [name] MRN: [id number] DOB: 13-Apr-1946 Today's Date: 09/25/2019    History of Present Illness 73 y.o. female with medical history significant of hypertension, hyperlipidemia, and diabetes mellitus type 2; who presents after being found down at her ILF with altered mental status. Admitted 09/19/19 with sepsis secondary to urinary tract infection and/or pressure ulcers with cellulitis; encephalopathy; mild rhabdomyolysis; AKI; She was also found to have numerous ulcerations in the lower extremity some necrotic ulcerations and underwent amputation of the left third, fourth and fifth toes on 10/2.   Clinical Impression   This 73 yo female admitted and underwent above presents to acute OT with decreased mobility, decreased balance, increased pain, new LLE amputations all affecting her safety and independence with basic ADLs. Pta pt was able to do her own basic ADLs and liked to go out shopping pre COVID, now she needs A for bed mobility and is unable to stand. She will benefit from acute OT with follow up at SNF.    Follow Up Recommendations  SNF    Equipment Recommendations  Other (comment)(TBD at SNF)       Precautions / Restrictions Precautions Precautions: Fall Precaution Comments: multiple legs wounds--bandaged Required Braces or Orthoses: Other Brace Other Brace: use darco shoe for heel walking on left Restrictions Weight Bearing Restrictions: No Other Position/Activity Restrictions: weight bear thru lt heel in Darco shoe      Mobility Bed Mobility Overal bed mobility: Needs Assistance Bed Mobility: Rolling;Sit to Supine;Supine to Sit Rolling: Min guard   Supine to sit: Mod assist Sit to supine: Max assist   General bed mobility comments: While sitting EOB pt started to lean posteriorly and started sliding off of bed--able to lay pt back down and get repositioned in bed with use of tredelenberg     Balance  Overall balance assessment: Needs assistance Sitting-balance support: Bilateral upper extremity supported;Feet supported Sitting balance-Leahy Scale: Poor   Postural control: Posterior lean                                 ADL either performed or assessed with clinical judgement   ADL Overall ADL's : Needs assistance/impaired Eating/Feeding: Set up;Bed level   Grooming: Set up;Oral care;Bed level   Upper Body Bathing: Moderate assistance;Bed level   Lower Body Bathing: Total assistance;Bed level   Upper Body Dressing : Maximal assistance;Bed level   Lower Body Dressing: Total assistance;Bed level     Toilet Transfer Details (indicate cue type and reason): min A to roll to place bed pan                 Vision Patient Visual Report: No change from baseline              Pertinent Vitals/Pain Pain Assessment: (yes, but I don't know where)     Hand Dominance Right   Extremity/Trunk Assessment Upper Extremity Assessment Upper Extremity Assessment: Generalized weakness           Communication Communication Communication: No difficulties   Cognition Arousal/Alertness: Awake/alert Behavior During Therapy: Anxious                                   General Comments: "I'm discombobulated" when asked what she meant she stated "I'm in a new place". She was oriented to place, year, reason here, happenings  in world. Pt kept saying she was sorry for everything I did for her--told her that was why I was here to help her. At end of session pt stating "I can just walk in bathroom to toilet"--I reminded her that she had almost slid off of bed just sitting and with PT she was unable to stand at which point she said, "I guess that is not the best of plans then"--but then a few minutes later she said the same thing again.              Home Living Family/patient expects to be discharged to:: Skilled nursing facility                                  Additional Comments: per MD notes, son wants SNF      Prior Functioning/Environment Level of Independence: Independent with assistive device(s)        Comments: prior to pandemic was doing her own shopping "I loved to shop" and swimmng for exercise        OT Problem List: Decreased strength;Impaired balance (sitting and/or standing);Obesity;Pain         OT Goals(Current goals can be found in the care plan section) Acute Rehab OT Goals Patient Stated Goal: to get back to being able to do more for myself OT Goal Formulation: With patient Time For Goal Achievement: 10/09/19 Potential to Achieve Goals: Good  OT Frequency: Min 1X/week   Barriers to D/C: Decreased caregiver support             AM-PAC OT "6 Clicks" Daily Activity     Outcome Measure Help from another person eating meals?: A Little Help from another person taking care of personal grooming?: A Little Help from another person toileting, which includes using toliet, bedpan, or urinal?: Total Help from another person bathing (including washing, rinsing, drying)?: Total Help from another person to put on and taking off regular upper body clothing?: Total Help from another person to put on and taking off regular lower body clothing?: Total 6 Click Score: 10   End of Session Nurse Communication: (back of right leg bleeding--RN in to asses and put pink bandage on it. Pt sat EOB but almost slid off. Pt is on bed pan (told NT as well)--so they could check on pt, not sure she will remember to call them.)  Activity Tolerance: Patient tolerated treatment well Patient left: in bed;with call bell/phone within reach;with bed alarm set  OT Visit Diagnosis: Other abnormalities of gait and mobility (R26.89);History of falling (Z91.81);Muscle weakness (generalized) (M62.81);Other symptoms and signs involving cognitive function;Pain Pain - part of body: (pt could not state where)                Time: 9702-6378 OT  Time Calculation (min): 33 min Charges:  OT General Charges $OT Visit: 1 Visit OT Evaluation $OT Eval Moderate Complexity: 1 Mod OT Treatments $Self Care/Home Management : 8-22 mins  Ignacia Palma, OTR/L Acute [employer] Pager [phone] Office [phone]     Evette Georges 09/25/2019, 10:17 AM

## 2019-09-25 NOTE — TOC Progression Note (Signed)
Transition of Care [facility]) - Progression Note    Patient Details  Name: Loretta [name] MRN: [id number] Date of Birth: 1946/03/15  Transition [facility]) CM/SW Contact  Loretta Salina [name], LCSW Phone Number: 09/25/2019, 2:08 PM  Clinical Narrative:  Talked with son Loretta [name] [phone]) regarding SNF placement for his mother. Loretta [name] requested that [facility] in [place] be contacted. Clinicals had already been transmitted to this facility. Follow-up call made to Loretta [name], admissions director at [facility] [phone]) and they currently do not have any beds. She also explained their quarantine and family visitation procedures and this information was communicated with Loretta [name]. He requested that [facility] in [place], [place] be contacted. Call made to facility and talked with Loretta [name], admissions director [phone]) and clinicals transmitted to facility (fax [phone]).      Expected Discharge Plan: Skilled Nursing Facility Barriers to Discharge: Ship broker, Continued Medical Work up  Expected Discharge Plan and Services Expected Discharge Plan: [place] In-house Referral: Clinical Social Work Discharge Planning Services: NA Post Acute Care Choice: [place] Living arrangements for the past 2 months: [place]                 DME Arranged: N/A DME Agency: NA       HH Arranged: NA HH Agency: NA       Social Determinants of Health (SDOH) Interventions  No SDOH interventions needed or requested at this time  Readmission Risk Interventions No flowsheet data found.

## 2019-09-25 NOTE — Progress Notes (Signed)
Postop day #3 status post amputation of left toes 3 4 and 5  She remains comfortable this morning.  Currently she is eating breakfast.  I did not change her dressing today.  1 of my PAs will come by later for dressing change.  Annamarie Major

## 2019-09-25 NOTE — Care Management Important Message (Signed)
Important Message  Patient Details  Name: Loretta [name] MRN: [id number] Date of Birth: December 25, 1945   Medicare Important Message Given:  Yes     Orbie Pyo 09/25/2019, 4:09 PM

## 2019-09-25 NOTE — Progress Notes (Signed)
PROGRESS NOTE        PATIENT DETAILS Name: Loretta [name] Age: 73 y.o. Sex: female Date of Birth: 16-Apr-1946 Admit Date: 09/19/2019 Admitting Physician Clydie Braun, MD ZOX:[id number], Zollie Beckers, MD  Brief Narrative: Patient is a 73 y.o. female with past medical history of HTN, dyslipidemia, DM-2-who was found down at her independent living facility-she was confused-she was brought to the hospital for further evaluation, where she was found to have hyponatremia, AKI and rhabdomyolysis.  She was also found to have numerous ulcerations in the lower extremity some necrotic ulcerations on the left third, fourth and fifth toes.  With supportive care-her electrolyte abnormalities improved-she was evaluated by vascular surgery and underwent amputation of the left third, fourth and fifth toes on 10/2.  See below for further details.  Subjective: She is awake and alert-pain at the operative site is stable.  She understands she will likely need to go to SNF on discharge.  Assessment/Plan: Sepsis secondary to UTI and numerous lower extremity ulceration with some surrounding cellulitis: Sepsis pathophysiology has resolved-all cultures remain negative-patient underwent left third, fourth and fifth toe amputation on 10/2.  No longer on antimicrobial agents.  Continue local wound care.  AKI: Likely hemodynamically mediated-resolved with supportive care.  Hypernatremia: Secondary to dehydration/poor oral intake-resolved with hypotonic saline.  Follow periodically.   Rhabdomyolysis: Secondary to patient being down on the floor properly for several days-CK levels have markedly improved.  Follow periodically.  Acute metabolic encephalopathy: Suspect secondary to AKI and hypernatremia.  CT head without acute abnormalities.  Encephalopathy has resolved-she is awake and alert and I suspect back to her baseline.   Per patient's son-she probably is on a autistic spectrum.    Numerous  bilateral lower extremity ulceration-including gangrenous changes to left third, fourth and fifth toes: Continue local wound care-patient is s/p amputation of the third/fourth and fifth toes on the left foot on 10/2.    DM-2: CBGs stable with SSI.  HTN: BP controlled-continue amlodipine and Bystolic.  Continue to hold Benicar-until blood pressure rises further.  Dyslipidemia: Hold statins  Chronic lower extremity edema: Volume status stable-since AKI and sodium levels are now stable-Lasix resumed. .    Chronic debility/deconditioning: Claims to walk with either a walker or cane-Per patient's son-ever since the pandemic broke out-patient has not been participating in her usual swimming/other activities as usual.  Suspect she may have developed deconditioning over the past few months.  PT evaluation completed-recommendations of SNF-social worker following.  Obesity: Estimated body mass index is 41.67 kg/m as calculated from the following:   Height as of this encounter: 5' 7.99" (1.727 m).   Weight as of this encounter: 124.3 kg.     Diet: Diet Order            Diet heart healthy/carb modified Room service appropriate? Yes; Fluid consistency: Thin  Diet effective now               DVT Prophylaxis: Prophylactic Heparin  Code Status: Full code  Family Communication: Son over the phone on 10/3-we will update son once we have SNF bed.  No major issues-has been relatively stable for the past 2 days.  Disposition Plan: Awaiting SNF bed-awaiting COVID PCR that was ordered on 10/4 in anticipation of SNF discharge.  Antimicrobial agents: Anti-infectives (From admission, onward)   Start     Dose/Rate Route  Frequency Ordered Stop   09/21/19 1200  vancomycin (VANCOCIN) 1,500 mg in sodium chloride 0.9 % 500 mL IVPB  Status:  Discontinued     1,500 mg 250 mL/hr over 120 Minutes Intravenous Every 48 hours 09/19/19 1110 09/20/19 0907   09/20/19 2230  cefTRIAXone (ROCEPHIN) 1 g in sodium  chloride 0.9 % 100 mL IVPB  Status:  Discontinued     1 g 200 mL/hr over 30 Minutes Intravenous Every 24 hours 09/19/19 1909 09/23/19 0823   09/20/19 1400  vancomycin (VANCOCIN) 1,250 mg in sodium chloride 0.9 % 250 mL IVPB  Status:  Discontinued     1,250 mg 166.7 mL/hr over 90 Minutes Intravenous Every 24 hours 09/20/19 0907 09/23/19 0823   09/19/19 2330  ceFEPIme (MAXIPIME) 2 g in sodium chloride 0.9 % 100 mL IVPB  Status:  Discontinued     2 g 200 mL/hr over 30 Minutes Intravenous Every 12 hours 09/19/19 1110 09/19/19 1909   09/19/19 2230  ceFEPIme (MAXIPIME) 2 g in sodium chloride 0.9 % 100 mL IVPB     2 g 200 mL/hr over 30 Minutes Intravenous  Once 09/19/19 1909 09/20/19 0005   09/19/19 1030  ceFEPIme (MAXIPIME) 2 g in sodium chloride 0.9 % 100 mL IVPB     2 g 200 mL/hr over 30 Minutes Intravenous  Once 09/19/19 1022 09/19/19 1306   09/19/19 1030  vancomycin (VANCOCIN) 2,000 mg in sodium chloride 0.9 % 500 mL IVPB     2,000 mg 250 mL/hr over 120 Minutes Intravenous  Once 09/19/19 1022 09/19/19 1525   09/19/19 1015  metroNIDAZOLE (FLAGYL) IVPB 500 mg     500 mg 100 mL/hr over 60 Minutes Intravenous  Once 09/19/19 1010 09/19/19 1306      Procedures: 10/2>>Transmetatarsal amputation of toes 3 4 and 5 left foot  CONSULTS:  vascular surgery  Time spent: 25- minutes-Greater than 50% of this time was spent in counseling, explanation of diagnosis, planning of further management, and coordination of care.  MEDICATIONS: Scheduled Meds:  amLODipine  5 mg Oral Daily   aspirin EC  81 mg Oral Daily   Chlorhexidine Gluconate Cloth  6 each Topical Daily   furosemide  40 mg Oral Daily   heparin  5,000 Units Subcutaneous Q8H   insulin aspart  0-15 Units Subcutaneous Q6H   insulin aspart  0-5 Units Subcutaneous QHS   mupirocin ointment  1 application Nasal BID   nebivolol  10 mg Oral Daily   nystatin cream   Topical BID   sodium chloride flush  3 mL Intravenous Q12H    Continuous Infusions:  dextrose 50 mL/hr at 09/25/19 0151   PRN Meds:.acetaminophen **OR** acetaminophen, albuterol, ondansetron **OR** ondansetron (ZOFRAN) IV   PHYSICAL EXAM: Vital signs: Vitals:   09/24/19 1653 09/24/19 2000 09/25/19 0401 09/25/19 0855  BP: (!) 146/72 (!) 158/73 (!) 152/86 (!) 115/59  Pulse: 63 63 64 61  Resp: 16 18 18 16   Temp: 97.8 F (36.6 C) 98.6 F (37 C) 98.2 F (36.8 C) 98 F (36.7 C)  TempSrc: Oral Oral Oral Oral  SpO2: 98% 93% 96% 92%  Weight:      Height:       Filed Weights   09/19/19 1014 09/22/19 1120 09/24/19 0616  Weight: 127 kg 127 kg 124.3 kg   Body mass index is 41.67 kg/m.   Gen Exam:Alert awake-not in any distress HEENT:atraumatic, normocephalic Chest: B/L clear to auscultation anteriorly CVS:S1S2 regular Abdomen:soft non tender, non distended Extremities:no edema  Neurology: Non focal Skin: no rash  I have personally reviewed following labs and imaging studies  LABORATORY DATA: CBC: Recent Labs  Lab 09/19/19 0855 09/20/19 0600 09/21/19 0650 09/22/19 0417 09/23/19 0350  WBC 15.5* 15.7* 12.0* 12.0* 12.0*  NEUTROABS 13.4*  --   --   --   --   HGB 18.6* 18.3* 16.4* 15.3* 13.8  HCT 58.8* 59.1* 50.4* 46.8* 42.6  MCV 91.0 94.9 88.9 89.0 87.7  PLT 186 120* 148* 145* 163    Basic Metabolic Panel: Recent Labs  Lab 09/20/19 0640 09/21/19 0650 09/22/19 0417 09/23/19 0350 09/24/19 0339  NA 153* 145 141 138 138  K 3.9 3.4* 3.0* 3.8 4.3  CL 118* 112* 109 109 107  CO2 18* 20* 22 20* 20*  GLUCOSE 267* 230* 204* 209* 185*  BUN 75* 51* 32* 25* 19  CREATININE 1.27* 1.15* 0.90 0.83 0.76  CALCIUM 8.8* 8.5* 8.3* 7.9* 7.9*  MG  --  2.5*  --   --   --     GFR: Estimated Creatinine Clearance: 91 mL/min (by C-G formula based on SCr of 0.76 mg/dL).  Liver Function Tests: Recent Labs  Lab 09/19/19 0855 09/20/19 0510 09/20/19 0640 09/21/19 0650 09/22/19 0417  AST 61* 49* 68* 43* 33  ALT 36 28 38 33 31  ALKPHOS 74  47 66 60 61  BILITOT 1.4* 1.4* 2.0* 1.5* 1.1  PROT 7.6 4.8* 6.2* 5.6* 5.4*  ALBUMIN 3.5 2.0* 2.6* 2.3* 2.2*   No results for input(s): LIPASE, AMYLASE in the last 168 hours. No results for input(s): AMMONIA in the last 168 hours.  Coagulation Profile: No results for input(s): INR, PROTIME in the last 168 hours.  Cardiac Enzymes: Recent Labs  Lab 09/19/19 0855 09/20/19 0510 09/20/19 0640 09/22/19 0417 09/24/19 0339  CKTOTAL 1,495* 1,219* 1,672* 510* 220    BNP (last 3 results) No results for input(s): PROBNP in the last 8760 hours.  HbA1C: No results for input(s): HGBA1C in the last 72 hours.  CBG: Recent Labs  Lab 09/24/19 1126 09/24/19 1624 09/24/19 2111 09/25/19 0032 09/25/19 0553  GLUCAP 168* 159* 164* 165* 163*    Lipid Profile: No results for input(s): CHOL, HDL, LDLCALC, TRIG, CHOLHDL, LDLDIRECT in the last 72 hours.  Thyroid Function Tests: No results for input(s): TSH, T4TOTAL, FREET4, T3FREE, THYROIDAB in the last 72 hours.  Anemia Panel: No results for input(s): VITAMINB12, FOLATE, FERRITIN, TIBC, IRON, RETICCTPCT in the last 72 hours.  Urine analysis:    Component Value Date/Time   COLORURINE YELLOW 09/19/2019 1200   APPEARANCEUR CLOUDY (A) 09/19/2019 1200   LABSPEC 1.018 09/19/2019 1200   PHURINE 5.0 09/19/2019 1200   GLUCOSEU 150 (A) 09/19/2019 1200   HGBUR MODERATE (A) 09/19/2019 1200   BILIRUBINUR NEGATIVE 09/19/2019 1200   KETONESUR NEGATIVE 09/19/2019 1200   PROTEINUR 100 (A) 09/19/2019 1200   NITRITE NEGATIVE 09/19/2019 1200   LEUKOCYTESUR LARGE (A) 09/19/2019 1200    Sepsis Labs: Lactic Acid, Venous    Component Value Date/Time   LATICACIDVEN 1.7 09/19/2019 1537    MICROBIOLOGY: Recent Results (from the past 240 hour(s))  Blood culture (routine x 2)     Status: None   Collection Time: 09/19/19  8:56 AM   Specimen: BLOOD RIGHT HAND  Result Value Ref Range Status   Specimen Description BLOOD RIGHT HAND  Final   Special  Requests   Final    BOTTLES DRAWN AEROBIC AND ANAEROBIC Blood Culture results may not be optimal due to  an inadequate volume of blood received in culture bottles   Culture   Final    NO GROWTH 5 DAYS Performed at [facility], [address] [place], [place] [address]    Report Status 09/24/2019 FINAL  Final  SARS Coronavirus 2 [facility](Hospital order, Performed in [facility] lab) Nasopharyngeal Nasopharyngeal Swab     Status: None   Collection Time: 09/19/19  9:06 AM   Specimen: Nasopharyngeal Swab  Result Value Ref Range Status   SARS Coronavirus 2 NEGATIVE NEGATIVE Final    Comment: (NOTE) If result is NEGATIVE SARS-CoV-2 target nucleic acids are NOT DETECTED. The SARS-CoV-2 RNA is generally detectable in upper and lower  respiratory specimens during the acute phase of infection. The lowest  concentration of SARS-CoV-2 viral copies this assay can detect is 250  copies / mL. A negative result does not preclude SARS-CoV-2 infection  and should not be used as the sole basis for treatment or other  patient management decisions.  A negative result may occur with  improper specimen collection / handling, submission of specimen other  than nasopharyngeal swab, presence of viral mutation(s) within the  areas targeted by this assay, and inadequate number of viral copies  (<250 copies / mL). A negative result must be combined with clinical  observations, patient history, and epidemiological information. If result is POSITIVE SARS-CoV-2 target nucleic acids are DETECTED. The SARS-CoV-2 RNA is generally detectable in upper and lower  respiratory specimens dur ing the acute phase of infection.  Positive  results are indicative of active infection with SARS-CoV-2.  Clinical  correlation with patient history and other diagnostic information is  necessary to determine patient infection status.  Positive results do  not rule out bacterial infection or co-infection with other viruses. If  result is PRESUMPTIVE POSTIVE SARS-CoV-2 nucleic acids MAY BE PRESENT.   A presumptive positive result was obtained on the submitted specimen  and confirmed on repeat testing.  While 2019 novel coronavirus  (SARS-CoV-2) nucleic acids may be present in the submitted sample  additional confirmatory testing may be necessary for epidemiological  and / or clinical management purposes  to differentiate between  SARS-CoV-2 and other Sarbecovirus currently known to infect humans.  If clinically indicated additional testing with an alternate test  methodology [phone]([id number]) is advised. The SARS-CoV-2 RNA is generally  detectable in upper and lower respiratory sp ecimens during the acute  phase of infection. The expected result is Negative. Fact Sheet for Patients:  BoilerBrush.com.cyhttps://www.fda.gov/media/136312/download Fact Sheet for Healthcare Providers: https://pope.com/https://www.fda.gov/media/136313/download This test is not yet approved or cleared by the [place] FDA and has been authorized for detection and/or diagnosis of SARS-CoV-2 by FDA under an Emergency Use Authorization (EUA).  This EUA will remain in effect (meaning this test can be used) for the duration of the COVID-19 declaration under Section 564(b)(1) of the Act, 21 U.S.C. section 360bbb-3(b)(1), unless the authorization is terminated or revoked sooner. Performed at [facility], [address] [place], [place] [address]   Blood culture (routine x 2)     Status: None   Collection Time: 09/19/19 11:42 AM   Specimen: BLOOD  Result Value Ref Range Status   Specimen Description BLOOD LEFT ANTECUBITAL  Final   Special Requests   Final    BOTTLES DRAWN AEROBIC ONLY Blood Culture results may not be optimal due to an inadequate volume of blood received in culture bottles   Culture   Final    NO GROWTH 5 DAYS  Performed at [facility], [address], [place], [place] [address]    Report Status 09/24/2019 FINAL  Final  Urine culture      Status: Abnormal   Collection Time: 09/19/19 12:15 PM   Specimen: Urine, Clean Catch  Result Value Ref Range Status   Specimen Description URINE, CLEAN CATCH  Final   Special Requests   Final    NONE Performed at [facility], [address], [place], [place] [address]    Culture MULTIPLE SPECIES PRESENT, SUGGEST RECOLLECTION (A)  Final   Report Status 09/20/2019 FINAL  Final  Surgical pcr screen     Status: Abnormal   Collection Time: 09/22/19  9:52 AM   Specimen: Nasal Mucosa; Nasal Swab  Result Value Ref Range Status   MRSA, PCR NEGATIVE NEGATIVE Final   Staphylococcus aureus POSITIVE (A) NEGATIVE Final    Comment: (NOTE) The Xpert SA Assay (FDA approved for NASAL specimens in patients 78 years of age and older), is one component of a comprehensive surveillance program. It is not intended to diagnose infection nor to guide or monitor treatment. Performed at [facility], [address], [place], [place] [address]     RADIOLOGY STUDIES/RESULTS: Dg Pelvis 1-2 Views  Result Date: 09/19/2019 CLINICAL DATA:  Pain after fall EXAM: PELVIS - 1-2 VIEW COMPARISON:  None. FINDINGS: There is no evidence of pelvic fracture or diastasis. No pelvic bone lesions are seen. IMPRESSION: Negative. Electronically Signed   By: Gerome Sam III M.D   On: 09/19/2019 10:22   Ct Head Wo Contrast  Result Date: 09/19/2019 CLINICAL DATA:  Headache. EXAM: CT HEAD WITHOUT CONTRAST CT CERVICAL SPINE WITHOUT CONTRAST TECHNIQUE: Multidetector CT imaging of the head and cervical spine was performed following the standard protocol without intravenous contrast. Multiplanar CT image reconstructions of the cervical spine were also generated. COMPARISON:  None. FINDINGS: CT HEAD FINDINGS Brain: No evidence of acute infarction, hemorrhage, hydrocephalus, extra-axial collection or mass lesion/mass effect. There is mild diffuse low-attenuation within the subcortical and periventricular white matter  compatible with chronic microvascular disease. Enlargement of the sulci and ventricles compatible with brain atrophy. Vascular: No hyperdense vessel or unexpected calcification. Skull: Normal. Negative for fracture or focal lesion. Sinuses/Orbits: No acute finding. Other: None. CT CERVICAL SPINE FINDINGS Alignment: Normal. Skull base and vertebrae: No acute fracture. No primary bone lesion or focal pathologic process. Soft tissues and spinal canal: No prevertebral fluid or swelling. No visible canal hematoma. Disc levels: Marked multi level disc space narrowing and endplate spurring. Upper chest: Negative. Other: None IMPRESSION: 1. No acute intracranial abnormality. 2. Chronic small vessel ischemic change and brain atrophy. 3. No evidence for cervical spine fracture. 4. Cervical degenerative disc disease. Electronically Signed   By: Signa Kell M.D.   On: 09/19/2019 09:52   Ct Cervical Spine Wo Contrast  Result Date: 09/19/2019 CLINICAL DATA:  Headache. EXAM: CT HEAD WITHOUT CONTRAST CT CERVICAL SPINE WITHOUT CONTRAST TECHNIQUE: Multidetector CT imaging of the head and cervical spine was performed following the standard protocol without intravenous contrast. Multiplanar CT image reconstructions of the cervical spine were also generated. COMPARISON:  None. FINDINGS: CT HEAD FINDINGS Brain: No evidence of acute infarction, hemorrhage, hydrocephalus, extra-axial collection or mass lesion/mass effect. There is mild diffuse low-attenuation within the subcortical and periventricular white matter compatible with chronic microvascular disease. Enlargement of the sulci and ventricles compatible with brain atrophy. Vascular: No hyperdense vessel or unexpected calcification. Skull: Normal. Negative for fracture or focal  lesion. Sinuses/Orbits: No acute finding. Other: None. CT CERVICAL SPINE FINDINGS Alignment: Normal. Skull base and vertebrae: No acute fracture. No primary bone lesion or focal pathologic process. Soft  tissues and spinal canal: No prevertebral fluid or swelling. No visible canal hematoma. Disc levels: Marked multi level disc space narrowing and endplate spurring. Upper chest: Negative. Other: None IMPRESSION: 1. No acute intracranial abnormality. 2. Chronic small vessel ischemic change and brain atrophy. 3. No evidence for cervical spine fracture. 4. Cervical degenerative disc disease. Electronically Signed   By: Signa Kell M.D.   On: 09/19/2019 09:52   Dg Chest Portable 1 View  Result Date: 09/19/2019 CLINICAL DATA:  Pain after fall EXAM: PORTABLE CHEST 1 VIEW COMPARISON:  None. FINDINGS: There is a tortuous thoracic aorta. The heart, hila, mediastinum, lungs, and pleura are otherwise unremarkable. IMPRESSION: No active disease. Electronically Signed   By: Gerome Sam III M.D   On: 09/19/2019 10:33   Dg Tibia/fibula Left Port  Result Date: 09/19/2019 CLINICAL DATA:  Pain after fall EXAM: PORTABLE LEFT TIBIA AND FIBULA - 2 VIEW COMPARISON:  None. FINDINGS: Degenerative changes in the knee.  No fractures. IMPRESSION: No fractures. Electronically Signed   By: Gerome Sam III M.D   On: 09/19/2019 10:23   Dg Hand Complete Right  Result Date: 09/19/2019 CLINICAL DATA:  Pain after fall EXAM: RIGHT HAND - COMPLETE 3+ VIEW COMPARISON:  None. FINDINGS: Known usual configuration of the distal radius is likely due to remote healed trauma. No acute fracture noted. IMPRESSION: No acute fractures are seen. Electronically Signed   By: Gerome Sam III M.D   On: 09/19/2019 10:25   Dg Foot Complete Left  Result Date: 09/19/2019 CLINICAL DATA:  Pain after fall EXAM: LEFT FOOT - COMPLETE 3+ VIEW COMPARISON:  None. FINDINGS: There is no evidence of fracture or dislocation. There is no evidence of arthropathy or other focal bone abnormality. Soft tissues are unremarkable. IMPRESSION: Negative. Electronically Signed   By: Gerome Sam III M.D   On: 09/19/2019 10:28   Dg Foot Complete Right  Result  Date: 09/19/2019 CLINICAL DATA:  Pain after fall EXAM: RIGHT FOOT COMPLETE - 3+ VIEW COMPARISON:  None. FINDINGS: Severe soft tissue swelling in the foot, particularly along the top of the foot. No fractures, dislocations, or bony erosion. IMPRESSION: Soft tissue swelling.  No other acute abnormalities. Electronically Signed   By: Gerome Sam III M.D   On: 09/19/2019 10:26   Vas [place] Abi With/wo Tbi  Result Date: 09/21/2019 LOWER EXTREMITY DOPPLER STUDY Indications: Ulceration, and Ischemic lower extremity.  Comparison Study: No prvious study available Performing Technologist: Milta Deiters, [place] [employer]  Examination Guidelines: A complete evaluation includes at minimum, Doppler waveform signals and systolic blood pressure reading at the level of bilateral brachial, anterior tibial, and posterior tibial arteries, when vessel segments are accessible. Bilateral testing is considered an integral part of a complete examination. Photoelectric Plethysmograph (PPG) waveforms and toe systolic pressure readings are included as required and additional duplex testing as needed. Limited examinations for reoccurring indications may be performed as noted.  ABI Findings: +---------+------------------+-----+---------+--------+  Right     Rt Pressure (mmHg) Index Waveform  Comment   +---------+------------------+-----+---------+--------+  Brachial  152                      triphasic           +---------+------------------+-----+---------+--------+  PTA       150  0.97  triphasic           +---------+------------------+-----+---------+--------+  DP        179                1.15  triphasic           +---------+------------------+-----+---------+--------+  Great Toe 124                0.80                      +---------+------------------+-----+---------+--------+ +---------+------------------+-----+---------+-------+  Left      Lt Pressure (mmHg) Index Waveform  Comment   +---------+------------------+-----+---------+-------+  Brachial  155                      triphasic          +---------+------------------+-----+---------+-------+  PTA       132                0.85  biphasic           +---------+------------------+-----+---------+-------+  DP        159                1.03  biphasic           +---------+------------------+-----+---------+-------+  Great Toe 255                1.65                     +---------+------------------+-----+---------+-------+ +-------+-----------+-----------+------------+------------+  ABI/TBI Today's ABI Today's TBI Previous ABI Previous TBI  +-------+-----------+-----------+------------+------------+  Right   1.15        0.80                                   +-------+-----------+-----------+------------+------------+  Left    1.03        calcified                              +-------+-----------+-----------+------------+------------+ No comparison available  Summary: Right: Resting right ankle-brachial index is within normal range. No evidence of significant right lower extremity arterial disease. The right toe-brachial index is normal. Left: Resting left ankle-brachial index is within normal range. No evidence of significant left lower extremity arterial disease. The left toe-brachial index is abnormal. TBI is non compressible consistent with calcification.  *See table(s) above for measurements and observations.  Electronically signed by Servando Snare MD on 09/21/2019 at 2:13:38 PM.   Final    Dg Femur Portable 1 View Left  Result Date: 09/19/2019 CLINICAL DATA:  Pain after fall EXAM: LEFT FEMUR PORTABLE 1 VIEW COMPARISON:  None. FINDINGS: Degenerative changes are seen in the knee.  No fractures are seen. IMPRESSION: Negative. Electronically Signed   By: Dorise Bullion III M.D   On: 09/19/2019 10:32   Dg Femur Portable 1 View Right  Result Date: 09/19/2019 CLINICAL DATA:  Pain after fall EXAM: RIGHT FEMUR PORTABLE 1 VIEW COMPARISON:  None.  FINDINGS: Degenerative changes in the knee.  No fractures seen in the femur. IMPRESSION: No fractures identified. Electronically Signed   By: Dorise Bullion III M.D   On: 09/19/2019 10:30     LOS: 6 days   Oren Binet, MD  [facility]  If 7PM-7AM, please contact night-coverage  Please page via [email or web address]  Go to [email or web address] and use Manuel Garcia's universal password to access. If you do not have the password, please contact the hospital operator.  Locate the [facility] provider you are looking for under [facility] and page to a number that you can be directly reached. If you still have difficulty reaching the provider, please page the [facility] (Director on Call) for the Hospitalists listed on amion for assistance.  09/25/2019, 10:38 AM

## 2019-09-26 LAB — GLUCOSE, CAPILLARY
Glucose-Capillary: 146 mg/dL — ABNORMAL HIGH (ref 70–99)
Glucose-Capillary: 159 mg/dL — ABNORMAL HIGH (ref 70–99)
Glucose-Capillary: 174 mg/dL — ABNORMAL HIGH (ref 70–99)
Glucose-Capillary: 175 mg/dL — ABNORMAL HIGH (ref 70–99)
Glucose-Capillary: 194 mg/dL — ABNORMAL HIGH (ref 70–99)

## 2019-09-26 LAB — SARS CORONAVIRUS 2 (TAT 6-24 HRS): SARS Coronavirus 2: NEGATIVE

## 2019-09-26 MED ORDER — ASPIRIN 81 MG PO TBEC: 81.0000 mg | DELAYED_RELEASE_TABLET | Freq: Every day | ORAL | Status: AC

## 2019-09-26 NOTE — Discharge Summary (Addendum)
PATIENT DETAILS Name: Loretta [name] Age: 73 y.o. Sex: female Date of Birth: June 01, 1946 MRN: [id number]. Admitting Physician: Clydie Braun, MD [id number], Zollie Beckers, MD  Admit Date: 09/19/2019 Discharge date: 09/27/19 Recommendations for Outpatient Follow-up:  1. Follow up with PCP in 1-2 weeks 2. Please obtain BMP/CBC in one week 3. Please ensure follow up with vascular surgery.  Admitted From:  [facility]  Disposition: SNF   Home Health: No  Equipment/Devices: None  Discharge Condition: Stable  CODE STATUS: FULL CODE  Diet recommendation:  Diet Order            Diet - low sodium heart healthy        Diet heart healthy/carb modified Room service appropriate? Yes; Fluid consistency: Thin  Diet effective now               Brief Summary: See H&P, Labs, Consult and Test reports for all details in brief, Patient is a 73 y.o. female with past medical history of HTN, dyslipidemia, DM-2-who was found down at her independent living facility-she was confused-she was brought to the hospital for further evaluation, where she was found to have hyponatremia, AKI and rhabdomyolysis.  She was also found to have numerous ulcerations in the lower extremity some necrotic ulcerations on the left third, fourth and fifth toes.  With supportive care-her electrolyte abnormalities improved-she was evaluated by vascular surgery and underwent amputation of the left third, fourth and fifth toes on 10/2.  See below for further details  Brief Hospital Course: Sepsis secondary to UTI and numerous lower extremity ulceration with some surrounding cellulitis: Sepsis pathophysiology has resolved-all cultures remain negative-patient underwent left third, fourth and fifth toe amputation on 10/2.  No longer on antimicrobial agents.  Continue local wound care.  AKI: Likely hemodynamically mediated-resolved with supportive care.  Hypernatremia: Secondary to dehydration/poor oral intake-resolved with  hypotonic saline.  Follow periodically.   Rhabdomyolysis: Secondary to patient being down on the floor properly for several days-CK levels have markedly improved.  Follow periodically.  Acute metabolic encephalopathy: Suspect secondary to AKI and hypernatremia.  CT head without acute abnormalities.  Encephalopathy has resolved-she is awake and alert and I suspect back to her baseline.   Per patient's son-she probably is on a autistic spectrum.    Numerous bilateral lower extremity ulceration-including gangrenous changes to left third, fourth and fifth toes s/p amputation of the third, fourth and fifth toes on 09/22/2019: Continue local wound care-patient is s/p amputation of the third/fourth and fifth toes on the left foot on 10/2.    Please ensure follow-up with vascular surgery upon discharge.Can continue dressing changes daily with Xeroform and Kerlix and Ace.  DM-2: CBGs stable with SSI.  Resume oral hypoglycemic agents on discharge.  HTN: BP controlled-continue amlodipine and Bystolic.  Continue to hold Benicar-until blood pressure rises further.  Dyslipidemia: Resume statin  Chronic lower extremity edema: Volume status stable-since AKI and sodium levels are now stable-Lasix resumed. .    Chronic debility/deconditioning: Claims to walk with either a walker or cane-Per patient's son-ever since the pandemic broke out-patient has not been participating in her usual swimming/other activities as usual.  Suspect she may have developed deconditioning over the past few months.  PT evaluation completed-recommendations of SNF  Obesity: Estimated body mass index is 41.67 kg/m as calculated from the following:   Height as of this encounter: 5' 7.99" (1.727 m).   Weight as of this encounter: 124.3 kg.   Procedures/Studies: See above  Discharge Diagnoses:  Principal  Problem:   Sepsis (HCC) Active Problems:   DM2 (diabetes mellitus, type 2) (HCC)   HTN (hypertension)   Hypernatremia    AKI (acute kidney injury) (HCC)   Cellulitis   Rhabdomyolysis   Acute lower UTI   Pressure injury of skin   Discharge Instructions:  Activity:  As tolerated with Full fall precautions use walker/cane & assistance as needed   Discharge Instructions    Call MD for:  difficulty breathing, headache or visual disturbances   Complete by: As directed    Call MD for:  redness, tenderness, or signs of infection (pain, swelling, redness, odor or green/yellow discharge around incision site)   Complete by: As directed    Diet - low sodium heart healthy   Complete by: As directed    Discharge instructions   Complete by: As directed    Check CBG AC and HS  Follow with Primary MD  Darci Needle, MD in 1-2 weeks  Please get a complete blood count and chemistry panel checked by your Primary MD at your next visit, and again as instructed by your Primary MD.  Get Medicines reviewed and adjusted: Please take all your medications with you for your next visit with your Primary MD  Laboratory/radiological data: Please request your Primary MD to go over all hospital tests and procedure/radiological results at the follow up, please ask your Primary MD to get all Hospital records sent to his/her office.  In some cases, they will be blood work, cultures and biopsy results pending at the time of your discharge. Please request that your primary care M.D. follows up on these results.  Also Note the following: If you experience worsening of your admission symptoms, develop shortness of breath, life threatening emergency, suicidal or homicidal thoughts you must seek medical attention immediately by calling 911 or calling your MD immediately  if symptoms less severe.  You must read complete instructions/literature along with all the possible adverse reactions/side effects for all the Medicines you take and that have been prescribed to you. Take any new Medicines after you have completely understood and accpet  all the possible adverse reactions/side effects.   Do not drive when taking Pain medications or sleeping medications (Benzodaizepines)  Do not take more than prescribed Pain, Sleep and Anxiety Medications. It is not advisable to combine anxiety,sleep and pain medications without talking with your primary care practitioner  Special Instructions: If you have smoked or chewed Tobacco  in the last 2 yrs please stop smoking, stop any regular Alcohol  and or any Recreational drug use.  Wear Seat belts while driving.  Please note: You were cared for by a hospitalist during your hospital stay. Once you are discharged, your primary care physician will handle any further medical issues. Please note that NO REFILLS for any discharge medications will be authorized once you are discharged, as it is imperative that you return to your primary care physician (or establish a relationship with a primary care physician if you do not have one) for your post hospital discharge needs so that they can reassess your need for medications and monitor your lab values.   Discharge wound care:   Complete by: As directed    Wound care to bilateral LE wounds, full and partial thickness:  Cleanse with NS, pat dry.  Cover with xeroform gauze [place] # [id number]), top with dry gauxe and secure with Kerlix roll gauze/paper tape.  Change twice daily.   To left foot amputation site: continue dressing changes daily  with Xeroform and Kerlix and Ace.   To left foot amputation site: continue dressing changes daily with Xeroform and Kerlix and Ace.    Increase activity slowly   Complete by: As directed      Allergies as of 09/26/2019      Reactions   Pravastatin Other (See Comments)   Caused leg paralysis       Medication List    STOP taking these medications   cefpodoxime 200 MG tablet Commonly known as: VANTIN   olmesartan 20 MG tablet Commonly known as: BENICAR     TAKE these medications   amLODipine 5 MG  tablet Commonly known as: NORVASC Take 5 mg by mouth daily.   aspirin 81 MG EC tablet Take 1 tablet (81 mg total) by mouth daily. What changed:   medication strength  how much to take   atorvastatin 20 MG tablet Commonly known as: LIPITOR Take 20 mg by mouth at bedtime.   furosemide 40 MG tablet Commonly known as: Lasix Take 1 tablet (40 mg total) by mouth daily.   ketoconazole 2 % cream Commonly known as: NIZORAL Apply 1 application topically daily as needed (scalp irritation).   Klor-Con M20 20 MEQ tablet Generic drug: potassium chloride SA Take 20 mEq by mouth daily.   multivitamin with minerals Tabs tablet Take 1 tablet by mouth daily.   nebivolol 10 MG tablet Commonly known as: BYSTOLIC Take 10 mg by mouth daily.   Vitamin D (Ergocalciferol) 50 MCG (2000 UT) Caps Take 1 capsule by mouth daily.   Xigduo XR 09-999 MG Tb24 Generic drug: Dapagliflozin-metFORMIN HCl ER Take 1 tablet by mouth daily.            Discharge Care Instructions  (From admission, onward)         Start     Ordered   09/26/19 0000  Discharge wound care:    Comments: Wound care to bilateral LE wounds, full and partial thickness:  Cleanse with NS, pat dry.  Cover with xeroform gauze [place] # [id number]), top with dry gauxe and secure with Kerlix roll gauze/paper tape.  Change twice daily.   To left foot amputation site: continue dressing changes daily with Xeroform and Kerlix and Ace.   09/26/19 0854         Follow-up Information    Darci Needle, MD. Schedule an appointment as soon as possible for a visit in 1 week(s).   Specialty: Endocrinology Contact information: [address] [place] [address] [phone]        Nada Libman, MD. Schedule an appointment as soon as possible for a visit in 2 week(s).   Specialties: Vascular Surgery, Cardiology Contact information: [address] [place] [address] [phone]          Allergies  Allergen  Reactions   Pravastatin Other (See Comments)    Caused leg paralysis     Consultations:   vascular surgery  Other Procedures/Studies: Dg Pelvis 1-2 Views  Result Date: 09/19/2019 CLINICAL DATA:  Pain after fall EXAM: PELVIS - 1-2 VIEW COMPARISON:  None. FINDINGS: There is no evidence of pelvic fracture or diastasis. No pelvic bone lesions are seen. IMPRESSION: Negative. Electronically Signed   By: Gerome Sam III M.D   On: 09/19/2019 10:22   Ct Head Wo Contrast  Result Date: 09/19/2019 CLINICAL DATA:  Headache. EXAM: CT HEAD WITHOUT CONTRAST CT CERVICAL SPINE WITHOUT CONTRAST TECHNIQUE: Multidetector CT imaging of the head and cervical spine was performed following  the standard protocol without intravenous contrast. Multiplanar CT image reconstructions of the cervical spine were also generated. COMPARISON:  None. FINDINGS: CT HEAD FINDINGS Brain: No evidence of acute infarction, hemorrhage, hydrocephalus, extra-axial collection or mass lesion/mass effect. There is mild diffuse low-attenuation within the subcortical and periventricular white matter compatible with chronic microvascular disease. Enlargement of the sulci and ventricles compatible with brain atrophy. Vascular: No hyperdense vessel or unexpected calcification. Skull: Normal. Negative for fracture or focal lesion. Sinuses/Orbits: No acute finding. Other: None. CT CERVICAL SPINE FINDINGS Alignment: Normal. Skull base and vertebrae: No acute fracture. No primary bone lesion or focal pathologic process. Soft tissues and spinal canal: No prevertebral fluid or swelling. No visible canal hematoma. Disc levels: Marked multi level disc space narrowing and endplate spurring. Upper chest: Negative. Other: None IMPRESSION: 1. No acute intracranial abnormality. 2. Chronic small vessel ischemic change and brain atrophy. 3. No evidence for cervical spine fracture. 4. Cervical degenerative disc disease. Electronically Signed   By: Signa Kell  M.D.   On: 09/19/2019 09:52   Ct Cervical Spine Wo Contrast  Result Date: 09/19/2019 CLINICAL DATA:  Headache. EXAM: CT HEAD WITHOUT CONTRAST CT CERVICAL SPINE WITHOUT CONTRAST TECHNIQUE: Multidetector CT imaging of the head and cervical spine was performed following the standard protocol without intravenous contrast. Multiplanar CT image reconstructions of the cervical spine were also generated. COMPARISON:  None. FINDINGS: CT HEAD FINDINGS Brain: No evidence of acute infarction, hemorrhage, hydrocephalus, extra-axial collection or mass lesion/mass effect. There is mild diffuse low-attenuation within the subcortical and periventricular white matter compatible with chronic microvascular disease. Enlargement of the sulci and ventricles compatible with brain atrophy. Vascular: No hyperdense vessel or unexpected calcification. Skull: Normal. Negative for fracture or focal lesion. Sinuses/Orbits: No acute finding. Other: None. CT CERVICAL SPINE FINDINGS Alignment: Normal. Skull base and vertebrae: No acute fracture. No primary bone lesion or focal pathologic process. Soft tissues and spinal canal: No prevertebral fluid or swelling. No visible canal hematoma. Disc levels: Marked multi level disc space narrowing and endplate spurring. Upper chest: Negative. Other: None IMPRESSION: 1. No acute intracranial abnormality. 2. Chronic small vessel ischemic change and brain atrophy. 3. No evidence for cervical spine fracture. 4. Cervical degenerative disc disease. Electronically Signed   By: Signa Kell M.D.   On: 09/19/2019 09:52   Dg Chest Portable 1 View  Result Date: 09/19/2019 CLINICAL DATA:  Pain after fall EXAM: PORTABLE CHEST 1 VIEW COMPARISON:  None. FINDINGS: There is a tortuous thoracic aorta. The heart, hila, mediastinum, lungs, and pleura are otherwise unremarkable. IMPRESSION: No active disease. Electronically Signed   By: Gerome Sam III M.D   On: 09/19/2019 10:33   Dg Tibia/fibula Left  Port  Result Date: 09/19/2019 CLINICAL DATA:  Pain after fall EXAM: PORTABLE LEFT TIBIA AND FIBULA - 2 VIEW COMPARISON:  None. FINDINGS: Degenerative changes in the knee.  No fractures. IMPRESSION: No fractures. Electronically Signed   By: Gerome Sam III M.D   On: 09/19/2019 10:23   Dg Hand Complete Right  Result Date: 09/19/2019 CLINICAL DATA:  Pain after fall EXAM: RIGHT HAND - COMPLETE 3+ VIEW COMPARISON:  None. FINDINGS: Known usual configuration of the distal radius is likely due to remote healed trauma. No acute fracture noted. IMPRESSION: No acute fractures are seen. Electronically Signed   By: Gerome Sam III M.D   On: 09/19/2019 10:25   Dg Foot Complete Left  Result Date: 09/19/2019 CLINICAL DATA:  Pain after fall EXAM: LEFT FOOT - COMPLETE 3+  VIEW COMPARISON:  None. FINDINGS: There is no evidence of fracture or dislocation. There is no evidence of arthropathy or other focal bone abnormality. Soft tissues are unremarkable. IMPRESSION: Negative. Electronically Signed   By: Gerome Sam III M.D   On: 09/19/2019 10:28   Dg Foot Complete Right  Result Date: 09/19/2019 CLINICAL DATA:  Pain after fall EXAM: RIGHT FOOT COMPLETE - 3+ VIEW COMPARISON:  None. FINDINGS: Severe soft tissue swelling in the foot, particularly along the top of the foot. No fractures, dislocations, or bony erosion. IMPRESSION: Soft tissue swelling.  No other acute abnormalities. Electronically Signed   By: Gerome Sam III M.D   On: 09/19/2019 10:26   Vas [place] Abi With/wo Tbi  Result Date: 09/21/2019 LOWER EXTREMITY DOPPLER STUDY Indications: Ulceration, and Ischemic lower extremity.  Comparison Study: No prvious study available Performing Technologist: Milta Deiters, [place] [facility]  Examination Guidelines: A complete evaluation includes at minimum, Doppler waveform signals and systolic blood pressure reading at the level of bilateral brachial, anterior tibial, and posterior tibial arteries, when vessel segments  are accessible. Bilateral testing is considered an integral part of a complete examination. Photoelectric Plethysmograph (PPG) waveforms and toe systolic pressure readings are included as required and additional duplex testing as needed. Limited examinations for reoccurring indications may be performed as noted.  ABI Findings: +---------+------------------+-----+---------+--------+  Right     Rt Pressure (mmHg) Index Waveform  Comment   +---------+------------------+-----+---------+--------+  Brachial  152                      triphasic           +---------+------------------+-----+---------+--------+  PTA       150                0.97  triphasic           +---------+------------------+-----+---------+--------+  DP        179                1.15  triphasic           +---------+------------------+-----+---------+--------+  Great Toe 124                0.80                      +---------+------------------+-----+---------+--------+ +---------+------------------+-----+---------+-------+  Left      Lt Pressure (mmHg) Index Waveform  Comment  +---------+------------------+-----+---------+-------+  Brachial  155                      triphasic          +---------+------------------+-----+---------+-------+  PTA       132                0.85  biphasic           +---------+------------------+-----+---------+-------+  DP        159                1.03  biphasic           +---------+------------------+-----+---------+-------+  Great Toe 255                1.65                     +---------+------------------+-----+---------+-------+ +-------+-----------+-----------+------------+------------+  ABI/TBI Today's ABI Today's TBI Previous ABI Previous TBI  +-------+-----------+-----------+------------+------------+  Right   1.15        0.80                                   +-------+-----------+-----------+------------+------------+  Left    1.03        calcified                               +-------+-----------+-----------+------------+------------+ No comparison available  Summary: Right: Resting right ankle-brachial index is within normal range. No evidence of significant right lower extremity arterial disease. The right toe-brachial index is normal. Left: Resting left ankle-brachial index is within normal range. No evidence of significant left lower extremity arterial disease. The left toe-brachial index is abnormal. TBI is non compressible consistent with calcification.  *See table(s) above for measurements and observations.  Electronically signed by Lemar Livings MD on 09/21/2019 at 2:13:38 PM.   Final    Dg Femur Portable 1 View Left  Result Date: 09/19/2019 CLINICAL DATA:  Pain after fall EXAM: LEFT FEMUR PORTABLE 1 VIEW COMPARISON:  None. FINDINGS: Degenerative changes are seen in the knee.  No fractures are seen. IMPRESSION: Negative. Electronically Signed   By: Gerome Sam III M.D   On: 09/19/2019 10:32   Dg Femur Portable 1 View Right  Result Date: 09/19/2019 CLINICAL DATA:  Pain after fall EXAM: RIGHT FEMUR PORTABLE 1 VIEW COMPARISON:  None. FINDINGS: Degenerative changes in the knee.  No fractures seen in the femur. IMPRESSION: No fractures identified. Electronically Signed   By: Gerome Sam III M.D   On: 09/19/2019 10:30     TODAY-DAY OF DISCHARGE:  Subjective:   Loretta [name] today has no headache,no chest abdominal pain,no new weakness tingling or numbness, feels much better wants to go home today.   Objective:   Blood pressure (!) 143/87, pulse 71, temperature 97.7 F (36.5 C), temperature source Oral, resp. rate 16, height 5' 7.99" (1.727 m), weight 126 kg, SpO2 93 %.  Intake/Output Summary (Last 24 hours) at 09/26/2019 0854 Last data filed at 09/26/2019 0601 Gross per 24 hour  Intake 1227.07 ml  Output --  Net 1227.07 ml   Filed Weights   09/22/19 1120 09/24/19 0616 09/25/19 2126  Weight: 127 kg 124.3 kg 126 kg    Exam: Awake Alert, Oriented  *3, No new F.N deficits, Normal affect [place].AT,PERRAL Supple Neck,No JVD, No cervical lymphadenopathy appriciated.  Symmetrical Chest wall movement, Good air movement bilaterally, CTAB RRR,No Gallops,Rubs or new Murmurs, No Parasternal Heave +ve B.Sounds, Abd Soft, Non tender, No organomegaly appriciated, No rebound -guarding or rigidity. No Cyanosis, Clubbing or edema, No new Rash or bruise   PERTINENT RADIOLOGIC STUDIES: Dg Pelvis 1-2 Views  Result Date: 09/19/2019 CLINICAL DATA:  Pain after fall EXAM: PELVIS - 1-2 VIEW COMPARISON:  None. FINDINGS: There is no evidence of pelvic fracture or diastasis. No pelvic bone lesions are seen. IMPRESSION: Negative. Electronically Signed   By: Gerome Sam III M.D   On: 09/19/2019 10:22   Ct Head Wo Contrast  Result Date: 09/19/2019 CLINICAL DATA:  Headache. EXAM: CT HEAD WITHOUT CONTRAST CT CERVICAL SPINE WITHOUT CONTRAST TECHNIQUE: Multidetector CT imaging of the head and cervical spine was performed following the standard protocol without intravenous contrast. Multiplanar CT image reconstructions of the cervical spine were also generated. COMPARISON:  None. FINDINGS: CT HEAD FINDINGS Brain: No evidence of acute infarction, hemorrhage, hydrocephalus, extra-axial collection or mass lesion/mass effect. There is mild diffuse low-attenuation within the subcortical and periventricular white matter compatible with chronic microvascular disease. Enlargement of the sulci and ventricles compatible with brain atrophy. Vascular: No hyperdense vessel or unexpected calcification. Skull: Normal. Negative for fracture or  focal lesion. Sinuses/Orbits: No acute finding. Other: None. CT CERVICAL SPINE FINDINGS Alignment: Normal. Skull base and vertebrae: No acute fracture. No primary bone lesion or focal pathologic process. Soft tissues and spinal canal: No prevertebral fluid or swelling. No visible canal hematoma. Disc levels: Marked multi level disc space narrowing and  endplate spurring. Upper chest: Negative. Other: None IMPRESSION: 1. No acute intracranial abnormality. 2. Chronic small vessel ischemic change and brain atrophy. 3. No evidence for cervical spine fracture. 4. Cervical degenerative disc disease. Electronically Signed   By: Signa Kellaylor  Stroud M.D.   On: 09/19/2019 09:52   Ct Cervical Spine Wo Contrast  Result Date: 09/19/2019 CLINICAL DATA:  Headache. EXAM: CT HEAD WITHOUT CONTRAST CT CERVICAL SPINE WITHOUT CONTRAST TECHNIQUE: Multidetector CT imaging of the head and cervical spine was performed following the standard protocol without intravenous contrast. Multiplanar CT image reconstructions of the cervical spine were also generated. COMPARISON:  None. FINDINGS: CT HEAD FINDINGS Brain: No evidence of acute infarction, hemorrhage, hydrocephalus, extra-axial collection or mass lesion/mass effect. There is mild diffuse low-attenuation within the subcortical and periventricular white matter compatible with chronic microvascular disease. Enlargement of the sulci and ventricles compatible with brain atrophy. Vascular: No hyperdense vessel or unexpected calcification. Skull: Normal. Negative for fracture or focal lesion. Sinuses/Orbits: No acute finding. Other: None. CT CERVICAL SPINE FINDINGS Alignment: Normal. Skull base and vertebrae: No acute fracture. No primary bone lesion or focal pathologic process. Soft tissues and spinal canal: No prevertebral fluid or swelling. No visible canal hematoma. Disc levels: Marked multi level disc space narrowing and endplate spurring. Upper chest: Negative. Other: None IMPRESSION: 1. No acute intracranial abnormality. 2. Chronic small vessel ischemic change and brain atrophy. 3. No evidence for cervical spine fracture. 4. Cervical degenerative disc disease. Electronically Signed   By: Signa Kellaylor  [place] M.D.   On: 09/19/2019 09:52   Dg Chest Portable 1 View  Result Date: 09/19/2019 CLINICAL DATA:  Pain after fall EXAM: PORTABLE CHEST  1 VIEW COMPARISON:  None. FINDINGS: There is a tortuous thoracic aorta. The heart, hila, mediastinum, lungs, and pleura are otherwise unremarkable. IMPRESSION: No active disease. Electronically Signed   By: Gerome Samavid  Williams III M.D   On: 09/19/2019 10:33   Dg Tibia/fibula Left Port  Result Date: 09/19/2019 CLINICAL DATA:  Pain after fall EXAM: PORTABLE LEFT TIBIA AND FIBULA - 2 VIEW COMPARISON:  None. FINDINGS: Degenerative changes in the knee.  No fractures. IMPRESSION: No fractures. Electronically Signed   By: Gerome Samavid  Williams III M.D   On: 09/19/2019 10:23   Dg Hand Complete Right  Result Date: 09/19/2019 CLINICAL DATA:  Pain after fall EXAM: RIGHT HAND - COMPLETE 3+ VIEW COMPARISON:  None. FINDINGS: Known usual configuration of the distal radius is likely due to remote healed trauma. No acute fracture noted. IMPRESSION: No acute fractures are seen. Electronically Signed   By: Gerome Samavid  Williams III M.D   On: 09/19/2019 10:25   Dg Foot Complete Left  Result Date: 09/19/2019 CLINICAL DATA:  Pain after fall EXAM: LEFT FOOT - COMPLETE 3+ VIEW COMPARISON:  None. FINDINGS: There is no evidence of fracture or dislocation. There is no evidence of arthropathy or other focal bone abnormality. Soft tissues are unremarkable. IMPRESSION: Negative. Electronically Signed   By: Gerome Samavid  Williams III M.D   On: 09/19/2019 10:28   Dg Foot Complete Right  Result Date: 09/19/2019 CLINICAL DATA:  Pain after fall EXAM: RIGHT FOOT COMPLETE - 3+ VIEW COMPARISON:  None. FINDINGS: Severe soft tissue swelling in the  foot, particularly along the top of the foot. No fractures, dislocations, or bony erosion. IMPRESSION: Soft tissue swelling.  No other acute abnormalities. Electronically Signed   By: Gerome Sam III M.D   On: 09/19/2019 10:26   Vas [place] Abi With/wo Tbi  Result Date: 09/21/2019 LOWER EXTREMITY DOPPLER STUDY Indications: Ulceration, and Ischemic lower extremity.  Comparison Study: No prvious study available  Performing Technologist: Milta Deiters, [place] RVS  Examination Guidelines: A complete evaluation includes at minimum, Doppler waveform signals and systolic blood pressure reading at the level of bilateral brachial, anterior tibial, and posterior tibial arteries, when vessel segments are accessible. Bilateral testing is considered an integral part of a complete examination. Photoelectric Plethysmograph (PPG) waveforms and toe systolic pressure readings are included as required and additional duplex testing as needed. Limited examinations for reoccurring indications may be performed as noted.  ABI Findings: +---------+------------------+-----+---------+--------+  Right     Rt Pressure (mmHg) Index Waveform  Comment   +---------+------------------+-----+---------+--------+  Brachial  152                      triphasic           +---------+------------------+-----+---------+--------+  PTA       150                0.97  triphasic           +---------+------------------+-----+---------+--------+  DP        179                1.15  triphasic           +---------+------------------+-----+---------+--------+  Great Toe 124                0.80                      +---------+------------------+-----+---------+--------+ +---------+------------------+-----+---------+-------+  Left      Lt Pressure (mmHg) Index Waveform  Comment  +---------+------------------+-----+---------+-------+  Brachial  155                      triphasic          +---------+------------------+-----+---------+-------+  PTA       132                0.85  biphasic           +---------+------------------+-----+---------+-------+  DP        159                1.03  biphasic           +---------+------------------+-----+---------+-------+  Great Toe 255                1.65                     +---------+------------------+-----+---------+-------+ +-------+-----------+-----------+------------+------------+  ABI/TBI Today's ABI Today's TBI Previous ABI Previous  TBI  +-------+-----------+-----------+------------+------------+  Right   1.15        0.80                                   +-------+-----------+-----------+------------+------------+  Left    1.03        calcified                              +-------+-----------+-----------+------------+------------+ No  comparison available  Summary: Right: Resting right ankle-brachial index is within normal range. No evidence of significant right lower extremity arterial disease. The right toe-brachial index is normal. Left: Resting left ankle-brachial index is within normal range. No evidence of significant left lower extremity arterial disease. The left toe-brachial index is abnormal. TBI is non compressible consistent with calcification.  *See table(s) above for measurements and observations.  Electronically signed by Lemar Livings MD on 09/21/2019 at 2:13:38 PM.   Final    Dg Femur Portable 1 View Left  Result Date: 09/19/2019 CLINICAL DATA:  Pain after fall EXAM: LEFT FEMUR PORTABLE 1 VIEW COMPARISON:  None. FINDINGS: Degenerative changes are seen in the knee.  No fractures are seen. IMPRESSION: Negative. Electronically Signed   By: Gerome Sam III M.D   On: 09/19/2019 10:32   Dg Femur Portable 1 View Right  Result Date: 09/19/2019 CLINICAL DATA:  Pain after fall EXAM: RIGHT FEMUR PORTABLE 1 VIEW COMPARISON:  None. FINDINGS: Degenerative changes in the knee.  No fractures seen in the femur. IMPRESSION: No fractures identified. Electronically Signed   By: Gerome Sam III M.D   On: 09/19/2019 10:30     PERTINENT LAB RESULTS: CBC: No results for input(s): WBC, HGB, HCT, PLT in the last 72 hours. CMET CMP     Component Value Date/Time   NA 138 09/24/2019 0339   K 4.3 09/24/2019 0339   CL 107 09/24/2019 0339   CO2 20 (L) 09/24/2019 0339   GLUCOSE 185 (H) 09/24/2019 0339   BUN 19 09/24/2019 0339   CREATININE 0.76 09/24/2019 0339   CALCIUM 7.9 (L) 09/24/2019 0339   PROT 5.4 (L) 09/22/2019 0417    ALBUMIN 2.2 (L) 09/22/2019 0417   AST 33 09/22/2019 0417   ALT 31 09/22/2019 0417   ALKPHOS 61 09/22/2019 0417   BILITOT 1.1 09/22/2019 0417   GFRNONAA >60 09/24/2019 0339   GFRAA >60 09/24/2019 0339    GFR Estimated Creatinine Clearance: 91.6 mL/min (by C-G formula based on SCr of 0.76 mg/dL). No results for input(s): LIPASE, AMYLASE in the last 72 hours. Recent Labs    09/24/19 0339  CKTOTAL 220   Invalid input(s): POCBNP No results for input(s): DDIMER in the last 72 hours. No results for input(s): HGBA1C in the last 72 hours. No results for input(s): CHOL, HDL, LDLCALC, TRIG, CHOLHDL, LDLDIRECT in the last 72 hours. No results for input(s): TSH, T4TOTAL, T3FREE, THYROIDAB in the last 72 hours.  Invalid input(s): FREET3 No results for input(s): VITAMINB12, FOLATE, FERRITIN, TIBC, IRON, RETICCTPCT in the last 72 hours. Coags: No results for input(s): INR in the last 72 hours.  Invalid input(s): PT Microbiology: Recent Results (from the past 240 hour(s))  Blood culture (routine x 2)     Status: None   Collection Time: 09/19/19  8:56 AM   Specimen: BLOOD RIGHT HAND  Result Value Ref Range Status   Specimen Description BLOOD RIGHT HAND  Final   Special Requests   Final    BOTTLES DRAWN AEROBIC AND ANAEROBIC Blood Culture results may not be optimal due to an inadequate volume of blood received in culture bottles   Culture   Final    NO GROWTH 5 DAYS Performed at [facility], [address], [place], [place] [address]    Report Status 09/24/2019 FINAL  Final  SARS Coronavirus 2 [facility] order, Performed in [facility] hospital lab) Nasopharyngeal Nasopharyngeal Swab     Status: None   Collection Time: 09/19/19  9:06 AM   Specimen: Nasopharyngeal Swab  Result Value Ref Range Status   SARS Coronavirus 2 NEGATIVE NEGATIVE Final    Comment: (NOTE) If result is NEGATIVE SARS-CoV-2 target nucleic acids are NOT DETECTED. The SARS-CoV-2 RNA is generally detectable in  upper and lower  respiratory specimens during the acute phase of infection. The lowest  concentration of SARS-CoV-2 viral copies this assay can detect is 250  copies / mL. A negative result does not preclude SARS-CoV-2 infection  and should not be used as the sole basis for treatment or other  patient management decisions.  A negative result may occur with  improper specimen collection / handling, submission of specimen other  than nasopharyngeal swab, presence of viral mutation(s) within the  areas targeted by this assay, and inadequate number of viral copies  (<250 copies / mL). A negative result must be combined with clinical  observations, patient history, and epidemiological information. If result is POSITIVE SARS-CoV-2 target nucleic acids are DETECTED. The SARS-CoV-2 RNA is generally detectable in upper and lower  respiratory specimens dur ing the acute phase of infection.  Positive  results are indicative of active infection with SARS-CoV-2.  Clinical  correlation with patient history and other diagnostic information is  necessary to determine patient infection status.  Positive results do  not rule out bacterial infection or co-infection with other viruses. If result is PRESUMPTIVE POSTIVE SARS-CoV-2 nucleic acids MAY BE PRESENT.   A presumptive positive result was obtained on the submitted specimen  and confirmed on repeat testing.  While 2019 novel coronavirus  (SARS-CoV-2) nucleic acids may be present in the submitted sample  additional confirmatory testing may be necessary for epidemiological  and / or clinical management purposes  to differentiate between  SARS-CoV-2 and other Sarbecovirus currently known to infect humans.  If clinically indicated additional testing with an alternate test  methodology [phone]) is advised. The SARS-CoV-2 RNA is generally  detectable in upper and lower respiratory sp ecimens during the acute  phase of infection. The expected result is  Negative. Fact Sheet for Patients:  StrictlyIdeas.no Fact Sheet for Healthcare Providers: BankingDealers.co.za This test is not yet approved or cleared by the [place] FDA and has been authorized for detection and/or diagnosis of SARS-CoV-2 by FDA under an Emergency Use Authorization (EUA).  This EUA will remain in effect (meaning this test can be used) for the duration of the COVID-19 declaration under Section 564(b)(1) of the Act, 21 U.S.C. section 360bbb-3(b)(1), unless the authorization is terminated or revoked sooner. Performed at [facility], [place] [address] [place], [place] [address]   Blood culture (routine x 2)     Status: None   Collection Time: 09/19/19 11:42 AM   Specimen: BLOOD  Result Value Ref Range Status   Specimen Description BLOOD LEFT ANTECUBITAL  Final   Special Requests   Final    BOTTLES DRAWN AEROBIC ONLY Blood Culture results may not be optimal due to an inadequate volume of blood received in culture bottles   Culture   Final    NO GROWTH 5 DAYS Performed at [facility], [place] [address] [place], [place] [address]    Report Status 09/24/2019 FINAL  Final  Urine culture     Status: Abnormal   Collection Time: 09/19/19 12:15 PM   Specimen: Urine, Clean Catch  Result Value Ref Range Status   Specimen Description URINE, CLEAN CATCH  Final   Special Requests   Final    NONE  Performed at [facility], [address] [place], [place] [address]    Culture MULTIPLE SPECIES PRESENT, SUGGEST RECOLLECTION (A)  Final   Report Status 09/20/2019 FINAL  Final  Surgical pcr screen     Status: Abnormal   Collection Time: 09/22/19  9:52 AM   Specimen: Nasal Mucosa; Nasal Swab  Result Value Ref Range Status   MRSA, PCR NEGATIVE NEGATIVE Final   Staphylococcus aureus POSITIVE (A) NEGATIVE Final    Comment: (NOTE) The Xpert SA Assay (FDA approved for NASAL specimens in patients 51 years of age and  older), is one component of a comprehensive surveillance program. It is not intended to diagnose infection nor to guide or monitor treatment. Performed at [facility], [address], [place], [place] [address]   Novel Coronavirus, NAA (hospital order; send-out to ref lab)     Status: None   Collection Time: 09/24/19 11:19 AM   Specimen: Nasopharyngeal Swab; Respiratory  Result Value Ref Range Status   SARS-CoV-2, NAA NOT DETECTED NOT DETECTED Final    Comment: (NOTE) This nucleic acid amplification test was developed and its performance characteristics determined by [employer]. Nucleic acid amplification tests include PCR and TMA. This test has not been FDA cleared or approved. This test has been authorized by FDA under an Emergency Use Authorization (EUA). This test is only authorized for the duration of time the declaration that circumstances exist justifying the authorization of the emergency use of in vitro diagnostic tests for detection of SARS-CoV-2 virus and/or diagnosis of COVID-19 infection under section 564(b)(1) of the Act, 21 U.S.C. 098JXB-1(Y) (1), unless the authorization is terminated or revoked sooner. When diagnostic testing is negative, the possibility of a false negative result should be considered in the context of a patient's recent exposures and the presence of clinical signs and symptoms consistent with COVID-19. An individual without symptoms of COVID- 19 and who is not shedding SARS-CoV-2 vi rus would expect to have a negative (not detected) result in this assay. Performed At: [facility] [address] [place], [place] [id number] Jolene Schimke MD YQ:[id number]    Coronavirus Source NASOPHARYNGEAL  Final    Comment: Performed at [facility], [address], [place], [place] [address]    FURTHER DISCHARGE INSTRUCTIONS:  Get Medicines reviewed and adjusted: Please take all your medications with you for your next visit  with your Primary MD  Laboratory/radiological data: Please request your Primary MD to go over all hospital tests and procedure/radiological results at the follow up, please ask your Primary MD to get all Hospital records sent to his/her office.  In some cases, they will be blood work, cultures and biopsy results pending at the time of your discharge. Please request that your primary care M.D. goes through all the records of your hospital data and follows up on these results.  Also Note the following: If you experience worsening of your admission symptoms, develop shortness of breath, life threatening emergency, suicidal or homicidal thoughts you must seek medical attention immediately by calling 911 or calling your MD immediately  if symptoms less severe.  You must read complete instructions/literature along with all the possible adverse reactions/side effects for all the Medicines you take and that have been prescribed to you. Take any new Medicines after you have completely understood and accpet all the possible adverse reactions/side effects.   Do not drive when taking Pain medications or sleeping medications (Benzodaizepines)  Do not take more than prescribed Pain,  Sleep and Anxiety Medications. It is not advisable to combine anxiety,sleep and pain medications without talking with your primary care practitioner  Special Instructions: If you have smoked or chewed Tobacco  in the last 2 yrs please stop smoking, stop any regular Alcohol  and or any Recreational drug use.  Wear Seat belts while driving.  Please note: You were cared for by a hospitalist during your hospital stay. Once you are discharged, your primary care physician will handle any further medical issues. Please note that NO REFILLS for any discharge medications will be authorized once you are discharged, as it is imperative that you return to your primary care physician (or establish a relationship with a primary care physician  if you do not have one) for your post hospital discharge needs so that they can reassess your need for medications and monitor your lab values.  Total Time spent coordinating discharge including counseling, education and face to face time equals35 minutes.  SignedJeoffrey Massed 09/26/2019 8:54 AM

## 2019-09-26 NOTE — Clinical Social Work Note (Signed)
Conversations had with admissions staff at [facility] [place], [place] transport, patient's son Collier Salina and his friend, SW Tiffany regarding patient's discharge. Son unwilling to pay PTAR fee of [phone] and CSW provided with information on [facility] transport. Patient unable to discharge today as [facility] would not be able to pick-up patient and be leaving the hospital by 6 pm this evening.  Transport arranged for 10/7, with a 10:30 am by [employer].  Call made to [facility] and talked with Meghan, RN (4:21 pm) regarding 10/7 discharge and 10:30 am pick-up. Also discussed COVID test with Meghan and CSW will f/u with admissions director tomorrow. Call made to Leroy Sea, admissions director at [facility] and message left regarding 10/7 discharge. Contacted son's friend Tiffany regarding 10/7 discharge and she will communicate this information to Mr. Raul Del. MD contacted and updated and patient's nurse updated on discharge. CSW will work with ambulance company and communicate with admissions Mudlogger at [facility] in [place], and family tomorrow.  Ikenna Ohms Givens, MSW, LCSW Licensed Clinical Social Worker [place] [phone]

## 2019-09-26 NOTE — Progress Notes (Signed)
Physical Therapy Treatment Patient Details Name: Loretta [name] MRN: [id number] DOB: Nov 23, 1946 Today's Date: 09/26/2019    History of Present Illness 73 y.o. female with medical history significant of hypertension, hyperlipidemia, and diabetes mellitus type 2; who presents after being found down at her ILF with altered mental status. Admitted 09/19/19 with sepsis secondary to urinary tract infection and/or pressure ulcers with cellulitis; encephalopathy; mild rhabdomyolysis; AKI; She was also found to have numerous ulcerations in the lower extremity some necrotic ulcerations and underwent amputation of the left third, fourth and fifth toes on 10/2.    PT Comments    Continuing work on functional mobility and activity tolerance;  Noting more difficulty with bed mobility today compared to last PT session; Still, she was able to pull herself from supine (HOB slightly elevated) to long sit in the bed x2, using bedrails; Pleasantly confused, decr understanding of deficits, asking to walk to the bathroom, but unable to organize trunk sitting EOB for balance; continue to recommend SNF for post-acute rehab   Follow Up Recommendations  SNF;Supervision/Assistance - 24 hour     Equipment Recommendations  None recommended by PT    Recommendations for Other Services       Precautions / Restrictions Precautions Precautions: Fall Precaution Comments: multiple legs wounds--bandaged Required Braces or Orthoses: Other Brace Other Brace: use darco shoe for heel walking on left Restrictions Weight Bearing Restrictions: No Other Position/Activity Restrictions: weight bear thru lt heel in Darco shoe    Mobility  Bed Mobility Overal bed mobility: Needs Assistance Bed Mobility: Rolling;Sit to Supine;Supine to Sit Rolling: Mod assist   Supine to sit: Mod assist Sit to supine: Max assist   General bed mobility comments: Heavy mod assist for all aspects of bed mobility today; Able to reciprocally  scoot backward with min assist; While sitting EOB pt started to lean posteriorly and started sliding off of bed--able to lay pt back down and get repositioned in bed with use of tredelenberg  Transfers                 General transfer comment: Unable to attempt  Ambulation/Gait                 Stairs             Wheelchair Mobility    Modified Rankin (Stroke Patients Only)       Balance     Sitting balance-Leahy Scale: Poor   Postural control: Posterior lean                                  Cognition Arousal/Alertness: Awake/alert Behavior During Therapy: WFL for tasks assessed/performed;Anxious Overall Cognitive Status: No family/caregiver present to determine baseline cognitive functioning                                        Exercises      General Comments        Pertinent Vitals/Pain Pain Assessment: Faces Faces Pain Scale: Hurts little more Pain Location: "all over" Pain Intervention(s): Repositioned    Home Living                      Prior Function            PT Goals (current goals can now be found in the  care plan section) Acute Rehab PT Goals Patient Stated Goal: to get back to being able to do more for myself PT Goal Formulation: With patient Time For Goal Achievement: 10/07/19 Potential to Achieve Goals: Fair Progress towards PT goals: Not progressing toward goals - comment(more difficulty with moving today)    Frequency    Min 2X/week      PT Plan Current plan remains appropriate    Co-evaluation              AM-PAC PT "6 Clicks" Mobility   Outcome Measure  Help needed turning from your back to your side while in a flat bed without using bedrails?: A Little Help needed moving from lying on your back to sitting on the side of a flat bed without using bedrails?: A Lot Help needed moving to and from a bed to a chair (including a wheelchair)?: Total Help needed  standing up from a chair using your arms (e.g., wheelchair or bedside chair)?: Total Help needed to walk in hospital room?: Total Help needed climbing 3-5 steps with a railing? : Total 6 Click Score: 9    End of Session Equipment Utilized During Treatment: Other (comment)(left Darco shoe) Activity Tolerance: Patient tolerated treatment well Patient left: in bed;with call bell/phone within reach;with bed alarm set Nurse Communication: Mobility status PT Visit Diagnosis: Repeated falls (R29.6);Muscle weakness (generalized) (M62.81)     Time: 1610-9604 PT Time Calculation (min) (ACUTE ONLY): 18 min  Charges:  $Therapeutic Activity: 8-22 mins                     Van Clines, PT  Acute Rehabilitation Services Pager [phone] Office [phone]    Loretta [name] 09/26/2019, 3:16 PM

## 2019-09-26 NOTE — Progress Notes (Addendum)
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  Continue dressing changes daily with Xeroform and Kerlix and Ace. F/U with Dr. Trula Slade in 3-4 weeks  Loretta [name] 09/26/2019 9:02 AM --  Laboratory Lab Results: No results for input(s): WBC, HGB, HCT, PLT in the last 72 hours. BMET Recent Labs    09/24/19 0339  NA 138  K 4.3  CL 107  CO2 20*  GLUCOSE 185*  BUN 19  CREATININE 0.76  CALCIUM 7.9*    COAG No results found for: INR, PROTIME No results found for: PTT

## 2019-09-27 DIAGNOSIS — E785 Hyperlipidemia, unspecified: Secondary | ICD-10-CM | POA: Diagnosis not present

## 2019-09-27 DIAGNOSIS — E870 Hyperosmolality and hypernatremia: Secondary | ICD-10-CM | POA: Diagnosis not present

## 2019-09-27 DIAGNOSIS — E11621 Type 2 diabetes mellitus with foot ulcer: Secondary | ICD-10-CM | POA: Diagnosis not present

## 2019-09-27 DIAGNOSIS — M6282 Rhabdomyolysis: Secondary | ICD-10-CM | POA: Diagnosis not present

## 2019-09-27 DIAGNOSIS — L97321 Non-pressure chronic ulcer of left ankle limited to breakdown of skin: Secondary | ICD-10-CM | POA: Diagnosis not present

## 2019-09-27 DIAGNOSIS — E119 Type 2 diabetes mellitus without complications: Secondary | ICD-10-CM | POA: Diagnosis not present

## 2019-09-27 DIAGNOSIS — W19XXXA Unspecified fall, initial encounter: Secondary | ICD-10-CM | POA: Diagnosis not present

## 2019-09-27 DIAGNOSIS — Z4789 Encounter for other orthopedic aftercare: Secondary | ICD-10-CM | POA: Diagnosis not present

## 2019-09-27 DIAGNOSIS — S98139A Complete traumatic amputation of one unspecified lesser toe, initial encounter: Secondary | ICD-10-CM | POA: Diagnosis not present

## 2019-09-27 DIAGNOSIS — I129 Hypertensive chronic kidney disease with stage 1 through stage 4 chronic kidney disease, or unspecified chronic kidney disease: Secondary | ICD-10-CM | POA: Diagnosis not present

## 2019-09-27 DIAGNOSIS — G9341 Metabolic encephalopathy: Secondary | ICD-10-CM | POA: Diagnosis not present

## 2019-09-27 DIAGNOSIS — Z4802 Encounter for removal of sutures: Secondary | ICD-10-CM | POA: Diagnosis not present

## 2019-09-27 DIAGNOSIS — Z23 Encounter for immunization: Secondary | ICD-10-CM | POA: Diagnosis not present

## 2019-09-27 DIAGNOSIS — I890 Lymphedema, not elsewhere classified: Secondary | ICD-10-CM | POA: Diagnosis not present

## 2019-09-27 DIAGNOSIS — L97311 Non-pressure chronic ulcer of right ankle limited to breakdown of skin: Secondary | ICD-10-CM | POA: Diagnosis not present

## 2019-09-27 DIAGNOSIS — L89890 Pressure ulcer of other site, unstageable: Secondary | ICD-10-CM | POA: Diagnosis not present

## 2019-09-27 DIAGNOSIS — E1151 Type 2 diabetes mellitus with diabetic peripheral angiopathy without gangrene: Secondary | ICD-10-CM | POA: Diagnosis not present

## 2019-09-27 DIAGNOSIS — I10 Essential (primary) hypertension: Secondary | ICD-10-CM | POA: Diagnosis not present

## 2019-09-27 DIAGNOSIS — S98139S Complete traumatic amputation of one unspecified lesser toe, sequela: Secondary | ICD-10-CM | POA: Diagnosis not present

## 2019-09-27 DIAGNOSIS — A419 Sepsis, unspecified organism: Secondary | ICD-10-CM | POA: Diagnosis not present

## 2019-09-27 DIAGNOSIS — I739 Peripheral vascular disease, unspecified: Secondary | ICD-10-CM | POA: Diagnosis not present

## 2019-09-27 DIAGNOSIS — Z9181 History of falling: Secondary | ICD-10-CM | POA: Diagnosis not present

## 2019-09-27 DIAGNOSIS — Z7982 Long term (current) use of aspirin: Secondary | ICD-10-CM | POA: Diagnosis not present

## 2019-09-27 DIAGNOSIS — Z89422 Acquired absence of other left toe(s): Secondary | ICD-10-CM | POA: Diagnosis not present

## 2019-09-27 DIAGNOSIS — R531 Weakness: Secondary | ICD-10-CM | POA: Diagnosis not present

## 2019-09-27 DIAGNOSIS — L97929 Non-pressure chronic ulcer of unspecified part of left lower leg with unspecified severity: Secondary | ICD-10-CM | POA: Diagnosis not present

## 2019-09-27 DIAGNOSIS — M6250 Muscle wasting and atrophy, not elsewhere classified, unspecified site: Secondary | ICD-10-CM | POA: Diagnosis not present

## 2019-09-27 DIAGNOSIS — N179 Acute kidney failure, unspecified: Secondary | ICD-10-CM | POA: Diagnosis not present

## 2019-09-27 DIAGNOSIS — L8990 Pressure ulcer of unspecified site, unspecified stage: Secondary | ICD-10-CM | POA: Diagnosis not present

## 2019-09-27 DIAGNOSIS — N390 Urinary tract infection, site not specified: Secondary | ICD-10-CM | POA: Diagnosis not present

## 2019-09-27 DIAGNOSIS — S98139D Complete traumatic amputation of one unspecified lesser toe, subsequent encounter: Secondary | ICD-10-CM | POA: Diagnosis not present

## 2019-09-27 DIAGNOSIS — R600 Localized edema: Secondary | ICD-10-CM | POA: Diagnosis not present

## 2019-09-27 DIAGNOSIS — R5381 Other malaise: Secondary | ICD-10-CM | POA: Diagnosis not present

## 2019-09-27 DIAGNOSIS — R410 Disorientation, unspecified: Secondary | ICD-10-CM | POA: Diagnosis not present

## 2019-09-27 LAB — GLUCOSE, CAPILLARY: Glucose-Capillary: 141 mg/dL — ABNORMAL HIGH (ref 70–99)

## 2019-09-27 NOTE — TOC Transition Note (Signed)
Transition of Care (TOC) - CM/SW Discharge Note 10/7 - Discharged to [facility] [place], [place] via ambulance.   Patient Details  Name: Loretta [name] MRN: [id number] Date of Birth: Jan 04, 1946  Transition of Care [facility]) CM/SW Contact:  Sable Feil, LCSW Phone Number: 09/27/2019, 10:33 AM   Clinical Narrative:  Arlana Lindau [phone]), admissions director at [facility] [place], [place] regarding patient's discharge and pick-up at 10:30 this morning by [facility]. Discharge clinicals transmitted to facility and call made to son-Peter Graff [phone] transport arrived.      Barriers to Discharge: Ship broker, Continued Medical Work up 10/6 - Working out transportation arrangements  Patient Goals and CMS Choice Patient states their goals for this hospitalization and ongoing recovery are:: Pt son would like his mother to receive rehab before returnig to her ALF CMS Medicare.gov Compare Post Acute Care list provided to:: Patient Represenative (must comment) Choice offered to / list presented to : Adult Children  Discharge Placement - [facility] in [place], [place]                      Discharge Plan and Services In-house Referral: Clinical Social Work Discharge Planning Services: NA Post Acute Care Choice: Skilled Nursing Facility          DME Arranged: N/A DME Agency: NA       HH Arranged: NA HH Agency: NA        Social Determinants of Health (SDOH) Interventions  No SDOH interventions needed   Readmission Risk Interventions No flowsheet data found.

## 2019-09-27 NOTE — Progress Notes (Signed)
Patient's dressings are clean and intact. Had to change sacral dressing due to the dressing being soiled. IV was taken out. Patient was cleaned and gowned was changed. Patient's belongings are with her in bed waiting to be transferred. Will continue to monitor.   Farley Ly RN

## 2019-09-27 NOTE — Plan of Care (Signed)
  Problem: Fluid Volume: Goal: Hemodynamic stability will improve Outcome: Adequate for Discharge   Problem: Clinical Measurements: Goal: Diagnostic test results will improve 09/27/2019 1133 by Baldo Ash, RN Outcome: Adequate for Discharge 09/27/2019 0835 by Baldo Ash, RN Outcome: Progressing Goal: Signs and symptoms of infection will decrease Outcome: Adequate for Discharge   Problem: Respiratory: Goal: Ability to maintain adequate ventilation will improve Outcome: Adequate for Discharge   Problem: Education: Goal: Knowledge of General Education information will improve Description: Including pain rating scale, medication(s)/side effects and non-pharmacologic comfort measures Outcome: Adequate for Discharge   Problem: Health Behavior/Discharge Planning: Goal: Ability to manage health-related needs will improve Outcome: Adequate for Discharge   Problem: Clinical Measurements: Goal: Ability to maintain clinical measurements within normal limits will improve Outcome: Adequate for Discharge Goal: Will remain free from infection Outcome: Adequate for Discharge Goal: Diagnostic test results will improve Outcome: Adequate for Discharge Goal: Respiratory complications will improve Outcome: Adequate for Discharge Goal: Cardiovascular complication will be avoided Outcome: Adequate for Discharge   Problem: Activity: Goal: Risk for activity intolerance will decrease Outcome: Adequate for Discharge   Problem: Nutrition: Goal: Adequate nutrition will be maintained Outcome: Adequate for Discharge   Problem: Coping: Goal: Level of anxiety will decrease Outcome: Adequate for Discharge   Problem: Elimination: Goal: Will not experience complications related to bowel motility Outcome: Adequate for Discharge Goal: Will not experience complications related to urinary retention Outcome: Adequate for Discharge   Problem: Pain Managment: Goal: General experience of  comfort will improve Outcome: Adequate for Discharge   Problem: Safety: Goal: Ability to remain free from injury will improve Outcome: Adequate for Discharge   Problem: Skin Integrity: Goal: Risk for impaired skin integrity will decrease Outcome: Adequate for Discharge   Problem: Acute Rehab PT Goals(only PT should resolve) Goal: Pt will Roll Supine to Side Outcome: Adequate for Discharge Goal: Pt Will Go Supine/Side To Sit Outcome: Adequate for Discharge Goal: Patient Will Transfer Sit To/From Stand Outcome: Adequate for Discharge Goal: Pt Will Transfer Bed To Chair/Chair To Bed Outcome: Adequate for Discharge Goal: Pt Will Ambulate Outcome: Adequate for Discharge   Problem: Acute Rehab OT Goals (only OT should resolve) Goal: Pt. Will Perform Grooming Outcome: Adequate for Discharge Goal: Pt. Will Perform Upper Body Bathing Outcome: Adequate for Discharge Goal: Pt. Will Transfer To Toilet Outcome: Adequate for Discharge Goal: Pt. Will Perform Toileting-Clothing Manipulation Outcome: Adequate for Discharge Goal: OT Additional ADL Goal #1 Outcome: Adequate for Discharge

## 2019-09-27 NOTE — Progress Notes (Signed)
Bruna Potter to be discharged [facility] in [place] per MD order. Patient verbalized understanding.  Skin clean, dry and intact without evidence of skin break down, no evidence of skin tears noted. IV catheter discontinued intact. Site without signs and symptoms of complications. Dressing and pressure applied. Pt denies pain at the site currently. No complaints noted.  Patient free of lines, drains, and wounds.   Discharge packet assembled. An After Visit Summary (AVS) was printed and given to the EMS personnel. Patient escorted via stretcher and discharged to [facility] via ambulance. Report called to accepting facility; all questions and concerns addressed.   Baldo Ash, RN

## 2019-09-27 NOTE — Plan of Care (Signed)
  Problem: Clinical Measurements: Goal: Diagnostic test results will improve Outcome: Progressing   

## 2019-09-29 DIAGNOSIS — E119 Type 2 diabetes mellitus without complications: Secondary | ICD-10-CM | POA: Diagnosis not present

## 2019-09-29 DIAGNOSIS — S98139A Complete traumatic amputation of one unspecified lesser toe, initial encounter: Secondary | ICD-10-CM | POA: Diagnosis not present

## 2019-09-29 DIAGNOSIS — R5381 Other malaise: Secondary | ICD-10-CM | POA: Diagnosis not present

## 2019-09-29 DIAGNOSIS — M6282 Rhabdomyolysis: Secondary | ICD-10-CM | POA: Diagnosis not present

## 2019-09-29 DIAGNOSIS — R410 Disorientation, unspecified: Secondary | ICD-10-CM | POA: Diagnosis not present

## 2019-10-02 DIAGNOSIS — E119 Type 2 diabetes mellitus without complications: Secondary | ICD-10-CM | POA: Diagnosis not present

## 2019-10-02 DIAGNOSIS — M6282 Rhabdomyolysis: Secondary | ICD-10-CM | POA: Diagnosis not present

## 2019-10-02 DIAGNOSIS — S98139A Complete traumatic amputation of one unspecified lesser toe, initial encounter: Secondary | ICD-10-CM | POA: Diagnosis not present

## 2019-10-02 DIAGNOSIS — R410 Disorientation, unspecified: Secondary | ICD-10-CM | POA: Diagnosis not present

## 2019-10-02 DIAGNOSIS — R5381 Other malaise: Secondary | ICD-10-CM | POA: Diagnosis not present

## 2019-10-03 DIAGNOSIS — S98139A Complete traumatic amputation of one unspecified lesser toe, initial encounter: Secondary | ICD-10-CM | POA: Diagnosis not present

## 2019-10-03 DIAGNOSIS — R5381 Other malaise: Secondary | ICD-10-CM | POA: Diagnosis not present

## 2019-10-03 DIAGNOSIS — I10 Essential (primary) hypertension: Secondary | ICD-10-CM | POA: Diagnosis not present

## 2019-10-03 DIAGNOSIS — R600 Localized edema: Secondary | ICD-10-CM | POA: Diagnosis not present

## 2019-10-03 DIAGNOSIS — E785 Hyperlipidemia, unspecified: Secondary | ICD-10-CM | POA: Diagnosis not present

## 2019-10-06 DIAGNOSIS — I10 Essential (primary) hypertension: Secondary | ICD-10-CM | POA: Diagnosis not present

## 2019-10-06 DIAGNOSIS — R5381 Other malaise: Secondary | ICD-10-CM | POA: Diagnosis not present

## 2019-10-06 DIAGNOSIS — W19XXXA Unspecified fall, initial encounter: Secondary | ICD-10-CM | POA: Diagnosis not present

## 2019-10-06 DIAGNOSIS — R600 Localized edema: Secondary | ICD-10-CM | POA: Diagnosis not present

## 2019-10-06 DIAGNOSIS — S98139S Complete traumatic amputation of one unspecified lesser toe, sequela: Secondary | ICD-10-CM | POA: Diagnosis not present

## 2019-10-09 DIAGNOSIS — I739 Peripheral vascular disease, unspecified: Secondary | ICD-10-CM | POA: Diagnosis not present

## 2019-10-09 DIAGNOSIS — E119 Type 2 diabetes mellitus without complications: Secondary | ICD-10-CM | POA: Diagnosis not present

## 2019-10-09 DIAGNOSIS — I10 Essential (primary) hypertension: Secondary | ICD-10-CM | POA: Diagnosis not present

## 2019-10-09 DIAGNOSIS — R600 Localized edema: Secondary | ICD-10-CM | POA: Diagnosis not present

## 2019-10-09 DIAGNOSIS — L8990 Pressure ulcer of unspecified site, unspecified stage: Secondary | ICD-10-CM | POA: Diagnosis not present

## 2019-10-09 DIAGNOSIS — R5381 Other malaise: Secondary | ICD-10-CM | POA: Diagnosis not present

## 2019-10-09 DIAGNOSIS — L97929 Non-pressure chronic ulcer of unspecified part of left lower leg with unspecified severity: Secondary | ICD-10-CM | POA: Diagnosis not present

## 2019-10-11 DIAGNOSIS — S98139A Complete traumatic amputation of one unspecified lesser toe, initial encounter: Secondary | ICD-10-CM | POA: Diagnosis not present

## 2019-10-11 DIAGNOSIS — E119 Type 2 diabetes mellitus without complications: Secondary | ICD-10-CM | POA: Diagnosis not present

## 2019-10-11 DIAGNOSIS — R5381 Other malaise: Secondary | ICD-10-CM | POA: Diagnosis not present

## 2019-10-11 DIAGNOSIS — I10 Essential (primary) hypertension: Secondary | ICD-10-CM | POA: Diagnosis not present

## 2019-10-11 DIAGNOSIS — L8990 Pressure ulcer of unspecified site, unspecified stage: Secondary | ICD-10-CM | POA: Diagnosis not present

## 2019-10-12 DIAGNOSIS — Z4802 Encounter for removal of sutures: Secondary | ICD-10-CM | POA: Diagnosis not present

## 2019-10-12 DIAGNOSIS — E11621 Type 2 diabetes mellitus with foot ulcer: Secondary | ICD-10-CM | POA: Diagnosis not present

## 2019-10-12 DIAGNOSIS — I10 Essential (primary) hypertension: Secondary | ICD-10-CM | POA: Diagnosis not present

## 2019-10-12 DIAGNOSIS — L97311 Non-pressure chronic ulcer of right ankle limited to breakdown of skin: Secondary | ICD-10-CM | POA: Diagnosis not present

## 2019-10-12 DIAGNOSIS — I890 Lymphedema, not elsewhere classified: Secondary | ICD-10-CM | POA: Diagnosis not present

## 2019-10-12 DIAGNOSIS — R600 Localized edema: Secondary | ICD-10-CM | POA: Diagnosis not present

## 2019-10-12 DIAGNOSIS — L97321 Non-pressure chronic ulcer of left ankle limited to breakdown of skin: Secondary | ICD-10-CM | POA: Diagnosis not present

## 2019-10-12 DIAGNOSIS — E1151 Type 2 diabetes mellitus with diabetic peripheral angiopathy without gangrene: Secondary | ICD-10-CM | POA: Diagnosis not present

## 2019-10-12 DIAGNOSIS — L97929 Non-pressure chronic ulcer of unspecified part of left lower leg with unspecified severity: Secondary | ICD-10-CM | POA: Diagnosis not present

## 2019-10-12 DIAGNOSIS — Z7982 Long term (current) use of aspirin: Secondary | ICD-10-CM | POA: Diagnosis not present

## 2019-10-14 DIAGNOSIS — M6282 Rhabdomyolysis: Secondary | ICD-10-CM | POA: Diagnosis not present

## 2019-10-14 DIAGNOSIS — L97524 Non-pressure chronic ulcer of other part of left foot with necrosis of bone: Secondary | ICD-10-CM | POA: Diagnosis not present

## 2019-10-14 DIAGNOSIS — Z4781 Encounter for orthopedic aftercare following surgical amputation: Secondary | ICD-10-CM | POA: Diagnosis not present

## 2019-10-14 DIAGNOSIS — Z741 Need for assistance with personal care: Secondary | ICD-10-CM | POA: Diagnosis not present

## 2019-10-14 DIAGNOSIS — G9341 Metabolic encephalopathy: Secondary | ICD-10-CM | POA: Diagnosis not present

## 2019-10-14 DIAGNOSIS — R262 Difficulty in walking, not elsewhere classified: Secondary | ICD-10-CM | POA: Diagnosis not present

## 2019-10-16 DIAGNOSIS — G9341 Metabolic encephalopathy: Secondary | ICD-10-CM | POA: Diagnosis not present

## 2019-10-16 DIAGNOSIS — M6282 Rhabdomyolysis: Secondary | ICD-10-CM | POA: Diagnosis not present

## 2019-10-16 DIAGNOSIS — R262 Difficulty in walking, not elsewhere classified: Secondary | ICD-10-CM | POA: Diagnosis not present

## 2019-10-16 DIAGNOSIS — L97524 Non-pressure chronic ulcer of other part of left foot with necrosis of bone: Secondary | ICD-10-CM | POA: Diagnosis not present

## 2019-10-16 DIAGNOSIS — Z4781 Encounter for orthopedic aftercare following surgical amputation: Secondary | ICD-10-CM | POA: Diagnosis not present

## 2019-10-16 DIAGNOSIS — Z741 Need for assistance with personal care: Secondary | ICD-10-CM | POA: Diagnosis not present

## 2019-10-17 DIAGNOSIS — Z741 Need for assistance with personal care: Secondary | ICD-10-CM | POA: Diagnosis not present

## 2019-10-17 DIAGNOSIS — G9341 Metabolic encephalopathy: Secondary | ICD-10-CM | POA: Diagnosis not present

## 2019-10-17 DIAGNOSIS — Z4781 Encounter for orthopedic aftercare following surgical amputation: Secondary | ICD-10-CM | POA: Diagnosis not present

## 2019-10-17 DIAGNOSIS — R262 Difficulty in walking, not elsewhere classified: Secondary | ICD-10-CM | POA: Diagnosis not present

## 2019-10-17 DIAGNOSIS — M6282 Rhabdomyolysis: Secondary | ICD-10-CM | POA: Diagnosis not present

## 2019-10-17 DIAGNOSIS — L97524 Non-pressure chronic ulcer of other part of left foot with necrosis of bone: Secondary | ICD-10-CM | POA: Diagnosis not present

## 2019-10-19 DIAGNOSIS — L97524 Non-pressure chronic ulcer of other part of left foot with necrosis of bone: Secondary | ICD-10-CM | POA: Diagnosis not present

## 2019-10-19 DIAGNOSIS — R262 Difficulty in walking, not elsewhere classified: Secondary | ICD-10-CM | POA: Diagnosis not present

## 2019-10-19 DIAGNOSIS — M6282 Rhabdomyolysis: Secondary | ICD-10-CM | POA: Diagnosis not present

## 2019-10-19 DIAGNOSIS — Z4781 Encounter for orthopedic aftercare following surgical amputation: Secondary | ICD-10-CM | POA: Diagnosis not present

## 2019-10-19 DIAGNOSIS — Z741 Need for assistance with personal care: Secondary | ICD-10-CM | POA: Diagnosis not present

## 2019-10-19 DIAGNOSIS — G9341 Metabolic encephalopathy: Secondary | ICD-10-CM | POA: Diagnosis not present

## 2019-10-23 DIAGNOSIS — L97929 Non-pressure chronic ulcer of unspecified part of left lower leg with unspecified severity: Secondary | ICD-10-CM | POA: Diagnosis not present

## 2019-10-23 DIAGNOSIS — I739 Peripheral vascular disease, unspecified: Secondary | ICD-10-CM | POA: Diagnosis not present

## 2019-10-23 DIAGNOSIS — I10 Essential (primary) hypertension: Secondary | ICD-10-CM | POA: Diagnosis not present

## 2019-10-23 DIAGNOSIS — S98132A Complete traumatic amputation of one left lesser toe, initial encounter: Secondary | ICD-10-CM | POA: Diagnosis not present

## 2019-10-24 DIAGNOSIS — L97321 Non-pressure chronic ulcer of left ankle limited to breakdown of skin: Secondary | ICD-10-CM | POA: Diagnosis not present

## 2019-10-24 DIAGNOSIS — Z7982 Long term (current) use of aspirin: Secondary | ICD-10-CM | POA: Diagnosis not present

## 2019-10-24 DIAGNOSIS — Z4802 Encounter for removal of sutures: Secondary | ICD-10-CM | POA: Diagnosis not present

## 2019-10-24 DIAGNOSIS — E11622 Type 2 diabetes mellitus with other skin ulcer: Secondary | ICD-10-CM | POA: Diagnosis not present

## 2019-10-24 DIAGNOSIS — R600 Localized edema: Secondary | ICD-10-CM | POA: Diagnosis not present

## 2019-10-24 DIAGNOSIS — T8131XA Disruption of external operation (surgical) wound, not elsewhere classified, initial encounter: Secondary | ICD-10-CM | POA: Diagnosis not present

## 2019-10-24 DIAGNOSIS — E11621 Type 2 diabetes mellitus with foot ulcer: Secondary | ICD-10-CM | POA: Diagnosis not present

## 2019-10-24 DIAGNOSIS — I890 Lymphedema, not elsewhere classified: Secondary | ICD-10-CM | POA: Diagnosis not present

## 2019-10-24 DIAGNOSIS — I10 Essential (primary) hypertension: Secondary | ICD-10-CM | POA: Diagnosis not present

## 2019-10-24 DIAGNOSIS — E1151 Type 2 diabetes mellitus with diabetic peripheral angiopathy without gangrene: Secondary | ICD-10-CM | POA: Diagnosis not present

## 2019-10-24 DIAGNOSIS — L97311 Non-pressure chronic ulcer of right ankle limited to breakdown of skin: Secondary | ICD-10-CM | POA: Diagnosis not present

## 2019-10-24 DIAGNOSIS — L97319 Non-pressure chronic ulcer of right ankle with unspecified severity: Secondary | ICD-10-CM | POA: Diagnosis not present

## 2019-10-24 DIAGNOSIS — L97412 Non-pressure chronic ulcer of right heel and midfoot with fat layer exposed: Secondary | ICD-10-CM | POA: Diagnosis not present

## 2019-10-25 DIAGNOSIS — Z9181 History of falling: Secondary | ICD-10-CM | POA: Diagnosis not present

## 2019-10-25 DIAGNOSIS — R531 Weakness: Secondary | ICD-10-CM | POA: Diagnosis not present

## 2019-10-25 DIAGNOSIS — R262 Difficulty in walking, not elsewhere classified: Secondary | ICD-10-CM | POA: Diagnosis not present

## 2019-10-25 DIAGNOSIS — M6281 Muscle weakness (generalized): Secondary | ICD-10-CM | POA: Diagnosis not present

## 2019-10-25 DIAGNOSIS — Z741 Need for assistance with personal care: Secondary | ICD-10-CM | POA: Diagnosis not present

## 2019-10-26 DIAGNOSIS — Z741 Need for assistance with personal care: Secondary | ICD-10-CM | POA: Diagnosis not present

## 2019-10-26 DIAGNOSIS — M6281 Muscle weakness (generalized): Secondary | ICD-10-CM | POA: Diagnosis not present

## 2019-10-26 DIAGNOSIS — R531 Weakness: Secondary | ICD-10-CM | POA: Diagnosis not present

## 2019-10-26 DIAGNOSIS — Z9181 History of falling: Secondary | ICD-10-CM | POA: Diagnosis not present

## 2019-10-26 DIAGNOSIS — R262 Difficulty in walking, not elsewhere classified: Secondary | ICD-10-CM | POA: Diagnosis not present

## 2019-10-27 DIAGNOSIS — R531 Weakness: Secondary | ICD-10-CM | POA: Diagnosis not present

## 2019-10-27 DIAGNOSIS — M6281 Muscle weakness (generalized): Secondary | ICD-10-CM | POA: Diagnosis not present

## 2019-10-27 DIAGNOSIS — R262 Difficulty in walking, not elsewhere classified: Secondary | ICD-10-CM | POA: Diagnosis not present

## 2019-10-27 DIAGNOSIS — Z9181 History of falling: Secondary | ICD-10-CM | POA: Diagnosis not present

## 2019-10-27 DIAGNOSIS — Z741 Need for assistance with personal care: Secondary | ICD-10-CM | POA: Diagnosis not present

## 2019-10-30 DIAGNOSIS — R531 Weakness: Secondary | ICD-10-CM | POA: Diagnosis not present

## 2019-10-30 DIAGNOSIS — Z9181 History of falling: Secondary | ICD-10-CM | POA: Diagnosis not present

## 2019-10-30 DIAGNOSIS — Z741 Need for assistance with personal care: Secondary | ICD-10-CM | POA: Diagnosis not present

## 2019-10-30 DIAGNOSIS — M6281 Muscle weakness (generalized): Secondary | ICD-10-CM | POA: Diagnosis not present

## 2019-10-30 DIAGNOSIS — R262 Difficulty in walking, not elsewhere classified: Secondary | ICD-10-CM | POA: Diagnosis not present

## 2019-10-31 DIAGNOSIS — R262 Difficulty in walking, not elsewhere classified: Secondary | ICD-10-CM | POA: Diagnosis not present

## 2019-10-31 DIAGNOSIS — Z741 Need for assistance with personal care: Secondary | ICD-10-CM | POA: Diagnosis not present

## 2019-10-31 DIAGNOSIS — M6281 Muscle weakness (generalized): Secondary | ICD-10-CM | POA: Diagnosis not present

## 2019-10-31 DIAGNOSIS — R531 Weakness: Secondary | ICD-10-CM | POA: Diagnosis not present

## 2019-10-31 DIAGNOSIS — Z9181 History of falling: Secondary | ICD-10-CM | POA: Diagnosis not present

## 2019-11-02 DIAGNOSIS — R262 Difficulty in walking, not elsewhere classified: Secondary | ICD-10-CM | POA: Diagnosis not present

## 2019-11-02 DIAGNOSIS — Z741 Need for assistance with personal care: Secondary | ICD-10-CM | POA: Diagnosis not present

## 2019-11-02 DIAGNOSIS — Z9181 History of falling: Secondary | ICD-10-CM | POA: Diagnosis not present

## 2019-11-02 DIAGNOSIS — R531 Weakness: Secondary | ICD-10-CM | POA: Diagnosis not present

## 2019-11-02 DIAGNOSIS — M6281 Muscle weakness (generalized): Secondary | ICD-10-CM | POA: Diagnosis not present

## 2019-11-03 DIAGNOSIS — R531 Weakness: Secondary | ICD-10-CM | POA: Diagnosis not present

## 2019-11-03 DIAGNOSIS — Z9181 History of falling: Secondary | ICD-10-CM | POA: Diagnosis not present

## 2019-11-03 DIAGNOSIS — R262 Difficulty in walking, not elsewhere classified: Secondary | ICD-10-CM | POA: Diagnosis not present

## 2019-11-03 DIAGNOSIS — Z741 Need for assistance with personal care: Secondary | ICD-10-CM | POA: Diagnosis not present

## 2019-11-03 DIAGNOSIS — M6281 Muscle weakness (generalized): Secondary | ICD-10-CM | POA: Diagnosis not present

## 2019-11-07 DIAGNOSIS — R531 Weakness: Secondary | ICD-10-CM | POA: Diagnosis not present

## 2019-11-07 DIAGNOSIS — M6281 Muscle weakness (generalized): Secondary | ICD-10-CM | POA: Diagnosis not present

## 2019-11-07 DIAGNOSIS — Z9181 History of falling: Secondary | ICD-10-CM | POA: Diagnosis not present

## 2019-11-07 DIAGNOSIS — Z741 Need for assistance with personal care: Secondary | ICD-10-CM | POA: Diagnosis not present

## 2019-11-07 DIAGNOSIS — R262 Difficulty in walking, not elsewhere classified: Secondary | ICD-10-CM | POA: Diagnosis not present

## 2019-11-08 DIAGNOSIS — R262 Difficulty in walking, not elsewhere classified: Secondary | ICD-10-CM | POA: Diagnosis not present

## 2019-11-08 DIAGNOSIS — M6281 Muscle weakness (generalized): Secondary | ICD-10-CM | POA: Diagnosis not present

## 2019-11-08 DIAGNOSIS — Z741 Need for assistance with personal care: Secondary | ICD-10-CM | POA: Diagnosis not present

## 2019-11-08 DIAGNOSIS — Z9181 History of falling: Secondary | ICD-10-CM | POA: Diagnosis not present

## 2019-11-08 DIAGNOSIS — R531 Weakness: Secondary | ICD-10-CM | POA: Diagnosis not present

## 2019-11-10 DIAGNOSIS — R531 Weakness: Secondary | ICD-10-CM | POA: Diagnosis not present

## 2019-11-10 DIAGNOSIS — R262 Difficulty in walking, not elsewhere classified: Secondary | ICD-10-CM | POA: Diagnosis not present

## 2019-11-10 DIAGNOSIS — Z9181 History of falling: Secondary | ICD-10-CM | POA: Diagnosis not present

## 2019-11-10 DIAGNOSIS — M6281 Muscle weakness (generalized): Secondary | ICD-10-CM | POA: Diagnosis not present

## 2019-11-10 DIAGNOSIS — Z741 Need for assistance with personal care: Secondary | ICD-10-CM | POA: Diagnosis not present

## 2019-11-13 DIAGNOSIS — Z741 Need for assistance with personal care: Secondary | ICD-10-CM | POA: Diagnosis not present

## 2019-11-13 DIAGNOSIS — R531 Weakness: Secondary | ICD-10-CM | POA: Diagnosis not present

## 2019-11-13 DIAGNOSIS — Z9181 History of falling: Secondary | ICD-10-CM | POA: Diagnosis not present

## 2019-11-13 DIAGNOSIS — R262 Difficulty in walking, not elsewhere classified: Secondary | ICD-10-CM | POA: Diagnosis not present

## 2019-11-13 DIAGNOSIS — M6281 Muscle weakness (generalized): Secondary | ICD-10-CM | POA: Diagnosis not present

## 2019-11-14 DIAGNOSIS — R262 Difficulty in walking, not elsewhere classified: Secondary | ICD-10-CM | POA: Diagnosis not present

## 2019-11-14 DIAGNOSIS — L97319 Non-pressure chronic ulcer of right ankle with unspecified severity: Secondary | ICD-10-CM | POA: Diagnosis not present

## 2019-11-14 DIAGNOSIS — Z9181 History of falling: Secondary | ICD-10-CM | POA: Diagnosis not present

## 2019-11-14 DIAGNOSIS — T8131XA Disruption of external operation (surgical) wound, not elsewhere classified, initial encounter: Secondary | ICD-10-CM | POA: Diagnosis not present

## 2019-11-14 DIAGNOSIS — Z741 Need for assistance with personal care: Secondary | ICD-10-CM | POA: Diagnosis not present

## 2019-11-14 DIAGNOSIS — L97519 Non-pressure chronic ulcer of other part of right foot with unspecified severity: Secondary | ICD-10-CM | POA: Diagnosis not present

## 2019-11-14 DIAGNOSIS — I10 Essential (primary) hypertension: Secondary | ICD-10-CM | POA: Diagnosis not present

## 2019-11-14 DIAGNOSIS — E11622 Type 2 diabetes mellitus with other skin ulcer: Secondary | ICD-10-CM | POA: Diagnosis not present

## 2019-11-14 DIAGNOSIS — M6281 Muscle weakness (generalized): Secondary | ICD-10-CM | POA: Diagnosis not present

## 2019-11-14 DIAGNOSIS — E11621 Type 2 diabetes mellitus with foot ulcer: Secondary | ICD-10-CM | POA: Diagnosis not present

## 2019-11-14 DIAGNOSIS — R531 Weakness: Secondary | ICD-10-CM | POA: Diagnosis not present

## 2019-11-14 DIAGNOSIS — L97412 Non-pressure chronic ulcer of right heel and midfoot with fat layer exposed: Secondary | ICD-10-CM | POA: Diagnosis not present

## 2019-11-15 DIAGNOSIS — R531 Weakness: Secondary | ICD-10-CM | POA: Diagnosis not present

## 2019-11-15 DIAGNOSIS — M6281 Muscle weakness (generalized): Secondary | ICD-10-CM | POA: Diagnosis not present

## 2019-11-15 DIAGNOSIS — R262 Difficulty in walking, not elsewhere classified: Secondary | ICD-10-CM | POA: Diagnosis not present

## 2019-11-15 DIAGNOSIS — Z9181 History of falling: Secondary | ICD-10-CM | POA: Diagnosis not present

## 2019-11-15 DIAGNOSIS — Z741 Need for assistance with personal care: Secondary | ICD-10-CM | POA: Diagnosis not present

## 2019-11-17 DIAGNOSIS — R531 Weakness: Secondary | ICD-10-CM | POA: Diagnosis not present

## 2019-11-17 DIAGNOSIS — R262 Difficulty in walking, not elsewhere classified: Secondary | ICD-10-CM | POA: Diagnosis not present

## 2019-11-17 DIAGNOSIS — Z741 Need for assistance with personal care: Secondary | ICD-10-CM | POA: Diagnosis not present

## 2019-11-17 DIAGNOSIS — Z9181 History of falling: Secondary | ICD-10-CM | POA: Diagnosis not present

## 2019-11-17 DIAGNOSIS — M6281 Muscle weakness (generalized): Secondary | ICD-10-CM | POA: Diagnosis not present

## 2019-11-21 DIAGNOSIS — Z1159 Encounter for screening for other viral diseases: Secondary | ICD-10-CM | POA: Diagnosis not present

## 2019-11-22 DIAGNOSIS — M6282 Rhabdomyolysis: Secondary | ICD-10-CM | POA: Diagnosis not present

## 2019-11-22 DIAGNOSIS — I10 Essential (primary) hypertension: Secondary | ICD-10-CM | POA: Diagnosis not present

## 2019-11-22 DIAGNOSIS — R262 Difficulty in walking, not elsewhere classified: Secondary | ICD-10-CM | POA: Diagnosis not present

## 2019-11-22 DIAGNOSIS — E1169 Type 2 diabetes mellitus with other specified complication: Secondary | ICD-10-CM | POA: Diagnosis not present

## 2019-11-22 DIAGNOSIS — U071 COVID-19: Secondary | ICD-10-CM | POA: Diagnosis not present

## 2019-11-23 DIAGNOSIS — U071 COVID-19: Secondary | ICD-10-CM | POA: Diagnosis not present

## 2019-11-23 DIAGNOSIS — R262 Difficulty in walking, not elsewhere classified: Secondary | ICD-10-CM | POA: Diagnosis not present

## 2019-11-23 DIAGNOSIS — J189 Pneumonia, unspecified organism: Secondary | ICD-10-CM | POA: Diagnosis not present

## 2019-11-24 DIAGNOSIS — R262 Difficulty in walking, not elsewhere classified: Secondary | ICD-10-CM | POA: Diagnosis not present

## 2019-11-24 DIAGNOSIS — E1169 Type 2 diabetes mellitus with other specified complication: Secondary | ICD-10-CM | POA: Diagnosis not present

## 2019-11-24 DIAGNOSIS — I10 Essential (primary) hypertension: Secondary | ICD-10-CM | POA: Diagnosis not present

## 2019-11-24 DIAGNOSIS — U071 COVID-19: Secondary | ICD-10-CM | POA: Diagnosis not present

## 2019-11-27 DIAGNOSIS — U071 COVID-19: Secondary | ICD-10-CM | POA: Diagnosis not present

## 2019-11-27 DIAGNOSIS — R262 Difficulty in walking, not elsewhere classified: Secondary | ICD-10-CM | POA: Diagnosis not present

## 2019-11-28 DIAGNOSIS — R262 Difficulty in walking, not elsewhere classified: Secondary | ICD-10-CM | POA: Diagnosis not present

## 2019-11-30 DIAGNOSIS — R262 Difficulty in walking, not elsewhere classified: Secondary | ICD-10-CM | POA: Diagnosis not present

## 2019-12-01 DIAGNOSIS — R262 Difficulty in walking, not elsewhere classified: Secondary | ICD-10-CM | POA: Diagnosis not present

## 2019-12-05 DIAGNOSIS — U071 COVID-19: Secondary | ICD-10-CM | POA: Diagnosis not present

## 2019-12-07 DIAGNOSIS — E1169 Type 2 diabetes mellitus with other specified complication: Secondary | ICD-10-CM | POA: Diagnosis not present

## 2019-12-07 DIAGNOSIS — E785 Hyperlipidemia, unspecified: Secondary | ICD-10-CM | POA: Diagnosis not present

## 2019-12-13 DIAGNOSIS — M6282 Rhabdomyolysis: Secondary | ICD-10-CM | POA: Diagnosis not present

## 2020-01-23 DIAGNOSIS — R531 Weakness: Secondary | ICD-10-CM | POA: Diagnosis not present

## 2020-01-23 DIAGNOSIS — R262 Difficulty in walking, not elsewhere classified: Secondary | ICD-10-CM | POA: Diagnosis not present

## 2020-01-23 DIAGNOSIS — Z9181 History of falling: Secondary | ICD-10-CM | POA: Diagnosis not present

## 2020-01-24 DIAGNOSIS — Z9181 History of falling: Secondary | ICD-10-CM | POA: Diagnosis not present

## 2020-01-24 DIAGNOSIS — R531 Weakness: Secondary | ICD-10-CM | POA: Diagnosis not present

## 2020-01-24 DIAGNOSIS — R262 Difficulty in walking, not elsewhere classified: Secondary | ICD-10-CM | POA: Diagnosis not present

## 2020-01-25 DIAGNOSIS — R262 Difficulty in walking, not elsewhere classified: Secondary | ICD-10-CM | POA: Diagnosis not present

## 2020-01-25 DIAGNOSIS — R531 Weakness: Secondary | ICD-10-CM | POA: Diagnosis not present

## 2020-01-25 DIAGNOSIS — Z9181 History of falling: Secondary | ICD-10-CM | POA: Diagnosis not present

## 2020-01-26 DIAGNOSIS — R531 Weakness: Secondary | ICD-10-CM | POA: Diagnosis not present

## 2020-01-26 DIAGNOSIS — Z9181 History of falling: Secondary | ICD-10-CM | POA: Diagnosis not present

## 2020-01-26 DIAGNOSIS — R262 Difficulty in walking, not elsewhere classified: Secondary | ICD-10-CM | POA: Diagnosis not present

## 2020-01-30 DIAGNOSIS — R262 Difficulty in walking, not elsewhere classified: Secondary | ICD-10-CM | POA: Diagnosis not present

## 2020-01-30 DIAGNOSIS — Z9181 History of falling: Secondary | ICD-10-CM | POA: Diagnosis not present

## 2020-01-30 DIAGNOSIS — R531 Weakness: Secondary | ICD-10-CM | POA: Diagnosis not present

## 2020-02-01 DIAGNOSIS — Z9181 History of falling: Secondary | ICD-10-CM | POA: Diagnosis not present

## 2020-02-01 DIAGNOSIS — R531 Weakness: Secondary | ICD-10-CM | POA: Diagnosis not present

## 2020-02-01 DIAGNOSIS — R262 Difficulty in walking, not elsewhere classified: Secondary | ICD-10-CM | POA: Diagnosis not present

## 2020-02-02 DIAGNOSIS — R262 Difficulty in walking, not elsewhere classified: Secondary | ICD-10-CM | POA: Diagnosis not present

## 2020-02-02 DIAGNOSIS — Z9181 History of falling: Secondary | ICD-10-CM | POA: Diagnosis not present

## 2020-02-02 DIAGNOSIS — R531 Weakness: Secondary | ICD-10-CM | POA: Diagnosis not present

## 2020-02-05 DIAGNOSIS — Z9181 History of falling: Secondary | ICD-10-CM | POA: Diagnosis not present

## 2020-02-05 DIAGNOSIS — R531 Weakness: Secondary | ICD-10-CM | POA: Diagnosis not present

## 2020-02-05 DIAGNOSIS — R262 Difficulty in walking, not elsewhere classified: Secondary | ICD-10-CM | POA: Diagnosis not present

## 2020-02-06 DIAGNOSIS — R531 Weakness: Secondary | ICD-10-CM | POA: Diagnosis not present

## 2020-02-06 DIAGNOSIS — R262 Difficulty in walking, not elsewhere classified: Secondary | ICD-10-CM | POA: Diagnosis not present

## 2020-02-06 DIAGNOSIS — Z9181 History of falling: Secondary | ICD-10-CM | POA: Diagnosis not present

## 2020-02-07 DIAGNOSIS — R262 Difficulty in walking, not elsewhere classified: Secondary | ICD-10-CM | POA: Diagnosis not present

## 2020-02-07 DIAGNOSIS — R531 Weakness: Secondary | ICD-10-CM | POA: Diagnosis not present

## 2020-02-07 DIAGNOSIS — Z9181 History of falling: Secondary | ICD-10-CM | POA: Diagnosis not present

## 2020-02-08 DIAGNOSIS — Z9181 History of falling: Secondary | ICD-10-CM | POA: Diagnosis not present

## 2020-02-08 DIAGNOSIS — R531 Weakness: Secondary | ICD-10-CM | POA: Diagnosis not present

## 2020-02-08 DIAGNOSIS — R262 Difficulty in walking, not elsewhere classified: Secondary | ICD-10-CM | POA: Diagnosis not present

## 2020-02-09 DIAGNOSIS — Z9181 History of falling: Secondary | ICD-10-CM | POA: Diagnosis not present

## 2020-02-09 DIAGNOSIS — R531 Weakness: Secondary | ICD-10-CM | POA: Diagnosis not present

## 2020-02-09 DIAGNOSIS — R262 Difficulty in walking, not elsewhere classified: Secondary | ICD-10-CM | POA: Diagnosis not present

## 2020-02-12 DIAGNOSIS — R262 Difficulty in walking, not elsewhere classified: Secondary | ICD-10-CM | POA: Diagnosis not present

## 2020-02-12 DIAGNOSIS — R531 Weakness: Secondary | ICD-10-CM | POA: Diagnosis not present

## 2020-02-12 DIAGNOSIS — Z9181 History of falling: Secondary | ICD-10-CM | POA: Diagnosis not present

## 2020-02-13 DIAGNOSIS — R531 Weakness: Secondary | ICD-10-CM | POA: Diagnosis not present

## 2020-02-13 DIAGNOSIS — Z9181 History of falling: Secondary | ICD-10-CM | POA: Diagnosis not present

## 2020-02-13 DIAGNOSIS — R262 Difficulty in walking, not elsewhere classified: Secondary | ICD-10-CM | POA: Diagnosis not present

## 2020-02-14 DIAGNOSIS — Z9181 History of falling: Secondary | ICD-10-CM | POA: Diagnosis not present

## 2020-02-14 DIAGNOSIS — R531 Weakness: Secondary | ICD-10-CM | POA: Diagnosis not present

## 2020-02-14 DIAGNOSIS — R262 Difficulty in walking, not elsewhere classified: Secondary | ICD-10-CM | POA: Diagnosis not present

## 2020-02-15 DIAGNOSIS — Z9181 History of falling: Secondary | ICD-10-CM | POA: Diagnosis not present

## 2020-02-15 DIAGNOSIS — R262 Difficulty in walking, not elsewhere classified: Secondary | ICD-10-CM | POA: Diagnosis not present

## 2020-02-15 DIAGNOSIS — R531 Weakness: Secondary | ICD-10-CM | POA: Diagnosis not present

## 2020-02-16 DIAGNOSIS — R262 Difficulty in walking, not elsewhere classified: Secondary | ICD-10-CM | POA: Diagnosis not present

## 2020-02-16 DIAGNOSIS — R531 Weakness: Secondary | ICD-10-CM | POA: Diagnosis not present

## 2020-02-16 DIAGNOSIS — Z9181 History of falling: Secondary | ICD-10-CM | POA: Diagnosis not present

## 2020-02-19 DIAGNOSIS — R262 Difficulty in walking, not elsewhere classified: Secondary | ICD-10-CM | POA: Diagnosis not present

## 2020-02-19 DIAGNOSIS — R531 Weakness: Secondary | ICD-10-CM | POA: Diagnosis not present

## 2020-02-20 DIAGNOSIS — R531 Weakness: Secondary | ICD-10-CM | POA: Diagnosis not present

## 2020-02-20 DIAGNOSIS — R262 Difficulty in walking, not elsewhere classified: Secondary | ICD-10-CM | POA: Diagnosis not present

## 2020-02-20 DIAGNOSIS — E1169 Type 2 diabetes mellitus with other specified complication: Secondary | ICD-10-CM | POA: Diagnosis not present

## 2020-02-22 DIAGNOSIS — R531 Weakness: Secondary | ICD-10-CM | POA: Diagnosis not present

## 2020-02-22 DIAGNOSIS — R262 Difficulty in walking, not elsewhere classified: Secondary | ICD-10-CM | POA: Diagnosis not present

## 2020-02-26 DIAGNOSIS — R262 Difficulty in walking, not elsewhere classified: Secondary | ICD-10-CM | POA: Diagnosis not present

## 2020-02-26 DIAGNOSIS — R531 Weakness: Secondary | ICD-10-CM | POA: Diagnosis not present

## 2020-02-28 DIAGNOSIS — R531 Weakness: Secondary | ICD-10-CM | POA: Diagnosis not present

## 2020-02-28 DIAGNOSIS — R262 Difficulty in walking, not elsewhere classified: Secondary | ICD-10-CM | POA: Diagnosis not present

## 2020-03-01 DIAGNOSIS — R262 Difficulty in walking, not elsewhere classified: Secondary | ICD-10-CM | POA: Diagnosis not present

## 2020-03-01 DIAGNOSIS — R531 Weakness: Secondary | ICD-10-CM | POA: Diagnosis not present

## 2020-03-04 DIAGNOSIS — R262 Difficulty in walking, not elsewhere classified: Secondary | ICD-10-CM | POA: Diagnosis not present

## 2020-03-04 DIAGNOSIS — R531 Weakness: Secondary | ICD-10-CM | POA: Diagnosis not present

## 2020-03-05 DIAGNOSIS — R531 Weakness: Secondary | ICD-10-CM | POA: Diagnosis not present

## 2020-03-05 DIAGNOSIS — R262 Difficulty in walking, not elsewhere classified: Secondary | ICD-10-CM | POA: Diagnosis not present

## 2020-03-11 DIAGNOSIS — E1169 Type 2 diabetes mellitus with other specified complication: Secondary | ICD-10-CM | POA: Diagnosis not present

## 2020-04-08 ENCOUNTER — Other Ambulatory Visit: Payer: Self-pay | Admitting: Internal Medicine

## 2020-04-08 DIAGNOSIS — Z1231 Encounter for screening mammogram for malignant neoplasm of breast: Secondary | ICD-10-CM

## 2020-04-08 DIAGNOSIS — I10 Essential (primary) hypertension: Secondary | ICD-10-CM | POA: Diagnosis not present

## 2020-04-08 DIAGNOSIS — M21372 Foot drop, left foot: Secondary | ICD-10-CM | POA: Diagnosis not present

## 2020-04-08 DIAGNOSIS — E1169 Type 2 diabetes mellitus with other specified complication: Secondary | ICD-10-CM | POA: Diagnosis not present

## 2020-04-08 DIAGNOSIS — I739 Peripheral vascular disease, unspecified: Secondary | ICD-10-CM | POA: Diagnosis not present

## 2020-05-22 DIAGNOSIS — E785 Hyperlipidemia, unspecified: Secondary | ICD-10-CM | POA: Diagnosis not present

## 2020-06-10 DIAGNOSIS — E1169 Type 2 diabetes mellitus with other specified complication: Secondary | ICD-10-CM | POA: Diagnosis not present

## 2020-07-29 DIAGNOSIS — E1169 Type 2 diabetes mellitus with other specified complication: Secondary | ICD-10-CM | POA: Diagnosis not present

## 2020-07-29 DIAGNOSIS — E876 Hypokalemia: Secondary | ICD-10-CM | POA: Diagnosis not present

## 2020-07-29 DIAGNOSIS — R531 Weakness: Secondary | ICD-10-CM | POA: Diagnosis not present

## 2020-07-29 DIAGNOSIS — I10 Essential (primary) hypertension: Secondary | ICD-10-CM | POA: Diagnosis not present

## 2020-07-29 DIAGNOSIS — I739 Peripheral vascular disease, unspecified: Secondary | ICD-10-CM | POA: Diagnosis not present

## 2020-08-08 DIAGNOSIS — I739 Peripheral vascular disease, unspecified: Secondary | ICD-10-CM | POA: Diagnosis not present

## 2020-08-12 DIAGNOSIS — H04123 Dry eye syndrome of bilateral lacrimal glands: Secondary | ICD-10-CM | POA: Diagnosis not present

## 2020-08-12 DIAGNOSIS — E119 Type 2 diabetes mellitus without complications: Secondary | ICD-10-CM | POA: Diagnosis not present

## 2020-08-12 DIAGNOSIS — H2513 Age-related nuclear cataract, bilateral: Secondary | ICD-10-CM | POA: Diagnosis not present

## 2020-08-21 DIAGNOSIS — Z1152 Encounter for screening for COVID-19: Secondary | ICD-10-CM | POA: Diagnosis not present

## 2020-08-28 DIAGNOSIS — R531 Weakness: Secondary | ICD-10-CM | POA: Diagnosis not present

## 2020-08-28 DIAGNOSIS — M21372 Foot drop, left foot: Secondary | ICD-10-CM | POA: Diagnosis not present

## 2020-08-29 DIAGNOSIS — R531 Weakness: Secondary | ICD-10-CM | POA: Diagnosis not present

## 2020-08-29 DIAGNOSIS — M21372 Foot drop, left foot: Secondary | ICD-10-CM | POA: Diagnosis not present

## 2020-08-30 DIAGNOSIS — M21372 Foot drop, left foot: Secondary | ICD-10-CM | POA: Diagnosis not present

## 2020-08-30 DIAGNOSIS — R531 Weakness: Secondary | ICD-10-CM | POA: Diagnosis not present

## 2020-09-03 DIAGNOSIS — R531 Weakness: Secondary | ICD-10-CM | POA: Diagnosis not present

## 2020-09-03 DIAGNOSIS — M21372 Foot drop, left foot: Secondary | ICD-10-CM | POA: Diagnosis not present

## 2020-09-04 DIAGNOSIS — M21372 Foot drop, left foot: Secondary | ICD-10-CM | POA: Diagnosis not present

## 2020-09-04 DIAGNOSIS — R531 Weakness: Secondary | ICD-10-CM | POA: Diagnosis not present

## 2020-09-05 DIAGNOSIS — M21372 Foot drop, left foot: Secondary | ICD-10-CM | POA: Diagnosis not present

## 2020-09-05 DIAGNOSIS — R531 Weakness: Secondary | ICD-10-CM | POA: Diagnosis not present

## 2020-09-10 DIAGNOSIS — R531 Weakness: Secondary | ICD-10-CM | POA: Diagnosis not present

## 2020-09-10 DIAGNOSIS — M21372 Foot drop, left foot: Secondary | ICD-10-CM | POA: Diagnosis not present

## 2020-09-10 DIAGNOSIS — E1169 Type 2 diabetes mellitus with other specified complication: Secondary | ICD-10-CM | POA: Diagnosis not present

## 2020-09-11 DIAGNOSIS — M21372 Foot drop, left foot: Secondary | ICD-10-CM | POA: Diagnosis not present

## 2020-09-11 DIAGNOSIS — R531 Weakness: Secondary | ICD-10-CM | POA: Diagnosis not present

## 2020-09-21 DIAGNOSIS — Z23 Encounter for immunization: Secondary | ICD-10-CM | POA: Diagnosis not present

## 2020-11-18 DIAGNOSIS — E785 Hyperlipidemia, unspecified: Secondary | ICD-10-CM | POA: Diagnosis not present

## 2020-12-09 DIAGNOSIS — E1169 Type 2 diabetes mellitus with other specified complication: Secondary | ICD-10-CM | POA: Diagnosis not present

## 2020-12-10 DIAGNOSIS — E119 Type 2 diabetes mellitus without complications: Secondary | ICD-10-CM | POA: Diagnosis not present

## 2020-12-19 DIAGNOSIS — E1159 Type 2 diabetes mellitus with other circulatory complications: Secondary | ICD-10-CM | POA: Diagnosis not present

## 2020-12-23 DIAGNOSIS — Z1152 Encounter for screening for COVID-19: Secondary | ICD-10-CM | POA: Diagnosis not present

## 2020-12-23 DIAGNOSIS — E876 Hypokalemia: Secondary | ICD-10-CM | POA: Diagnosis not present

## 2020-12-23 DIAGNOSIS — Z8616 Personal history of COVID-19: Secondary | ICD-10-CM | POA: Diagnosis not present

## 2020-12-23 DIAGNOSIS — E1169 Type 2 diabetes mellitus with other specified complication: Secondary | ICD-10-CM | POA: Diagnosis not present

## 2020-12-26 DIAGNOSIS — Z8616 Personal history of COVID-19: Secondary | ICD-10-CM | POA: Diagnosis not present

## 2020-12-26 DIAGNOSIS — E1169 Type 2 diabetes mellitus with other specified complication: Secondary | ICD-10-CM | POA: Diagnosis not present

## 2020-12-26 DIAGNOSIS — Z1152 Encounter for screening for COVID-19: Secondary | ICD-10-CM | POA: Diagnosis not present

## 2020-12-30 DIAGNOSIS — B351 Tinea unguium: Secondary | ICD-10-CM | POA: Diagnosis not present

## 2020-12-30 DIAGNOSIS — E119 Type 2 diabetes mellitus without complications: Secondary | ICD-10-CM | POA: Diagnosis not present

## 2020-12-30 DIAGNOSIS — L603 Nail dystrophy: Secondary | ICD-10-CM | POA: Diagnosis not present

## 2021-01-02 DIAGNOSIS — U071 COVID-19: Secondary | ICD-10-CM | POA: Diagnosis not present

## 2021-01-02 DIAGNOSIS — Z1152 Encounter for screening for COVID-19: Secondary | ICD-10-CM | POA: Diagnosis not present

## 2021-01-02 DIAGNOSIS — E785 Hyperlipidemia, unspecified: Secondary | ICD-10-CM | POA: Diagnosis not present

## 2021-01-02 DIAGNOSIS — Z8616 Personal history of COVID-19: Secondary | ICD-10-CM | POA: Diagnosis not present

## 2021-01-02 DIAGNOSIS — E1169 Type 2 diabetes mellitus with other specified complication: Secondary | ICD-10-CM | POA: Diagnosis not present

## 2021-01-09 DIAGNOSIS — E1169 Type 2 diabetes mellitus with other specified complication: Secondary | ICD-10-CM | POA: Diagnosis not present

## 2021-01-09 DIAGNOSIS — Z8616 Personal history of COVID-19: Secondary | ICD-10-CM | POA: Diagnosis not present

## 2021-01-09 DIAGNOSIS — E785 Hyperlipidemia, unspecified: Secondary | ICD-10-CM | POA: Diagnosis not present

## 2021-01-09 DIAGNOSIS — Z1152 Encounter for screening for COVID-19: Secondary | ICD-10-CM | POA: Diagnosis not present

## 2021-01-09 DIAGNOSIS — U071 COVID-19: Secondary | ICD-10-CM | POA: Diagnosis not present

## 2021-02-12 DIAGNOSIS — E569 Vitamin deficiency, unspecified: Secondary | ICD-10-CM | POA: Diagnosis not present

## 2021-02-12 DIAGNOSIS — Z1152 Encounter for screening for COVID-19: Secondary | ICD-10-CM | POA: Diagnosis not present

## 2021-02-13 DIAGNOSIS — Z741 Need for assistance with personal care: Secondary | ICD-10-CM | POA: Diagnosis not present

## 2021-02-13 DIAGNOSIS — R531 Weakness: Secondary | ICD-10-CM | POA: Diagnosis not present

## 2021-02-13 DIAGNOSIS — M6281 Muscle weakness (generalized): Secondary | ICD-10-CM | POA: Diagnosis not present

## 2021-02-13 DIAGNOSIS — M21372 Foot drop, left foot: Secondary | ICD-10-CM | POA: Diagnosis not present

## 2021-02-13 DIAGNOSIS — R2681 Unsteadiness on feet: Secondary | ICD-10-CM | POA: Diagnosis not present

## 2021-02-13 DIAGNOSIS — R262 Difficulty in walking, not elsewhere classified: Secondary | ICD-10-CM | POA: Diagnosis not present

## 2021-02-14 DIAGNOSIS — R2681 Unsteadiness on feet: Secondary | ICD-10-CM | POA: Diagnosis not present

## 2021-02-14 DIAGNOSIS — M6281 Muscle weakness (generalized): Secondary | ICD-10-CM | POA: Diagnosis not present

## 2021-02-14 DIAGNOSIS — R531 Weakness: Secondary | ICD-10-CM | POA: Diagnosis not present

## 2021-02-14 DIAGNOSIS — M21372 Foot drop, left foot: Secondary | ICD-10-CM | POA: Diagnosis not present

## 2021-02-14 DIAGNOSIS — R262 Difficulty in walking, not elsewhere classified: Secondary | ICD-10-CM | POA: Diagnosis not present

## 2021-02-14 DIAGNOSIS — Z741 Need for assistance with personal care: Secondary | ICD-10-CM | POA: Diagnosis not present

## 2021-02-17 DIAGNOSIS — Z741 Need for assistance with personal care: Secondary | ICD-10-CM | POA: Diagnosis not present

## 2021-02-17 DIAGNOSIS — M6281 Muscle weakness (generalized): Secondary | ICD-10-CM | POA: Diagnosis not present

## 2021-02-17 DIAGNOSIS — M21372 Foot drop, left foot: Secondary | ICD-10-CM | POA: Diagnosis not present

## 2021-02-17 DIAGNOSIS — R262 Difficulty in walking, not elsewhere classified: Secondary | ICD-10-CM | POA: Diagnosis not present

## 2021-02-17 DIAGNOSIS — R2681 Unsteadiness on feet: Secondary | ICD-10-CM | POA: Diagnosis not present

## 2021-02-17 DIAGNOSIS — R531 Weakness: Secondary | ICD-10-CM | POA: Diagnosis not present

## 2021-02-18 DIAGNOSIS — M21372 Foot drop, left foot: Secondary | ICD-10-CM | POA: Diagnosis not present

## 2021-02-18 DIAGNOSIS — M6281 Muscle weakness (generalized): Secondary | ICD-10-CM | POA: Diagnosis not present

## 2021-02-18 DIAGNOSIS — R262 Difficulty in walking, not elsewhere classified: Secondary | ICD-10-CM | POA: Diagnosis not present

## 2021-02-18 DIAGNOSIS — R2681 Unsteadiness on feet: Secondary | ICD-10-CM | POA: Diagnosis not present

## 2021-02-18 DIAGNOSIS — R531 Weakness: Secondary | ICD-10-CM | POA: Diagnosis not present

## 2021-02-18 DIAGNOSIS — Z741 Need for assistance with personal care: Secondary | ICD-10-CM | POA: Diagnosis not present

## 2021-02-19 DIAGNOSIS — M6281 Muscle weakness (generalized): Secondary | ICD-10-CM | POA: Diagnosis not present

## 2021-02-19 DIAGNOSIS — R531 Weakness: Secondary | ICD-10-CM | POA: Diagnosis not present

## 2021-02-19 DIAGNOSIS — R262 Difficulty in walking, not elsewhere classified: Secondary | ICD-10-CM | POA: Diagnosis not present

## 2021-02-19 DIAGNOSIS — Z741 Need for assistance with personal care: Secondary | ICD-10-CM | POA: Diagnosis not present

## 2021-02-19 DIAGNOSIS — R2681 Unsteadiness on feet: Secondary | ICD-10-CM | POA: Diagnosis not present

## 2021-02-19 DIAGNOSIS — M21372 Foot drop, left foot: Secondary | ICD-10-CM | POA: Diagnosis not present

## 2021-02-20 DIAGNOSIS — Z741 Need for assistance with personal care: Secondary | ICD-10-CM | POA: Diagnosis not present

## 2021-02-20 DIAGNOSIS — R262 Difficulty in walking, not elsewhere classified: Secondary | ICD-10-CM | POA: Diagnosis not present

## 2021-02-20 DIAGNOSIS — M21372 Foot drop, left foot: Secondary | ICD-10-CM | POA: Diagnosis not present

## 2021-02-20 DIAGNOSIS — M6281 Muscle weakness (generalized): Secondary | ICD-10-CM | POA: Diagnosis not present

## 2021-02-20 DIAGNOSIS — R531 Weakness: Secondary | ICD-10-CM | POA: Diagnosis not present

## 2021-02-20 DIAGNOSIS — R2681 Unsteadiness on feet: Secondary | ICD-10-CM | POA: Diagnosis not present

## 2021-02-21 DIAGNOSIS — Z741 Need for assistance with personal care: Secondary | ICD-10-CM | POA: Diagnosis not present

## 2021-02-21 DIAGNOSIS — R262 Difficulty in walking, not elsewhere classified: Secondary | ICD-10-CM | POA: Diagnosis not present

## 2021-02-21 DIAGNOSIS — R2681 Unsteadiness on feet: Secondary | ICD-10-CM | POA: Diagnosis not present

## 2021-02-21 DIAGNOSIS — M6281 Muscle weakness (generalized): Secondary | ICD-10-CM | POA: Diagnosis not present

## 2021-02-21 DIAGNOSIS — M21372 Foot drop, left foot: Secondary | ICD-10-CM | POA: Diagnosis not present

## 2021-02-21 DIAGNOSIS — R531 Weakness: Secondary | ICD-10-CM | POA: Diagnosis not present

## 2021-02-24 DIAGNOSIS — R2681 Unsteadiness on feet: Secondary | ICD-10-CM | POA: Diagnosis not present

## 2021-02-24 DIAGNOSIS — R262 Difficulty in walking, not elsewhere classified: Secondary | ICD-10-CM | POA: Diagnosis not present

## 2021-02-24 DIAGNOSIS — Z741 Need for assistance with personal care: Secondary | ICD-10-CM | POA: Diagnosis not present

## 2021-02-24 DIAGNOSIS — M6281 Muscle weakness (generalized): Secondary | ICD-10-CM | POA: Diagnosis not present

## 2021-02-24 DIAGNOSIS — R531 Weakness: Secondary | ICD-10-CM | POA: Diagnosis not present

## 2021-02-24 DIAGNOSIS — M21372 Foot drop, left foot: Secondary | ICD-10-CM | POA: Diagnosis not present

## 2021-02-25 DIAGNOSIS — R262 Difficulty in walking, not elsewhere classified: Secondary | ICD-10-CM | POA: Diagnosis not present

## 2021-02-25 DIAGNOSIS — M6281 Muscle weakness (generalized): Secondary | ICD-10-CM | POA: Diagnosis not present

## 2021-02-25 DIAGNOSIS — M21372 Foot drop, left foot: Secondary | ICD-10-CM | POA: Diagnosis not present

## 2021-02-25 DIAGNOSIS — R2681 Unsteadiness on feet: Secondary | ICD-10-CM | POA: Diagnosis not present

## 2021-02-25 DIAGNOSIS — R531 Weakness: Secondary | ICD-10-CM | POA: Diagnosis not present

## 2021-02-25 DIAGNOSIS — Z741 Need for assistance with personal care: Secondary | ICD-10-CM | POA: Diagnosis not present

## 2021-02-26 DIAGNOSIS — M21372 Foot drop, left foot: Secondary | ICD-10-CM | POA: Diagnosis not present

## 2021-02-26 DIAGNOSIS — R531 Weakness: Secondary | ICD-10-CM | POA: Diagnosis not present

## 2021-02-26 DIAGNOSIS — Z741 Need for assistance with personal care: Secondary | ICD-10-CM | POA: Diagnosis not present

## 2021-02-26 DIAGNOSIS — R262 Difficulty in walking, not elsewhere classified: Secondary | ICD-10-CM | POA: Diagnosis not present

## 2021-02-26 DIAGNOSIS — M6281 Muscle weakness (generalized): Secondary | ICD-10-CM | POA: Diagnosis not present

## 2021-02-26 DIAGNOSIS — R2681 Unsteadiness on feet: Secondary | ICD-10-CM | POA: Diagnosis not present

## 2021-02-27 DIAGNOSIS — M6281 Muscle weakness (generalized): Secondary | ICD-10-CM | POA: Diagnosis not present

## 2021-02-27 DIAGNOSIS — R531 Weakness: Secondary | ICD-10-CM | POA: Diagnosis not present

## 2021-02-27 DIAGNOSIS — R2681 Unsteadiness on feet: Secondary | ICD-10-CM | POA: Diagnosis not present

## 2021-02-27 DIAGNOSIS — R262 Difficulty in walking, not elsewhere classified: Secondary | ICD-10-CM | POA: Diagnosis not present

## 2021-02-27 DIAGNOSIS — Z741 Need for assistance with personal care: Secondary | ICD-10-CM | POA: Diagnosis not present

## 2021-02-27 DIAGNOSIS — M21372 Foot drop, left foot: Secondary | ICD-10-CM | POA: Diagnosis not present

## 2021-02-28 DIAGNOSIS — R262 Difficulty in walking, not elsewhere classified: Secondary | ICD-10-CM | POA: Diagnosis not present

## 2021-02-28 DIAGNOSIS — M6281 Muscle weakness (generalized): Secondary | ICD-10-CM | POA: Diagnosis not present

## 2021-02-28 DIAGNOSIS — R2681 Unsteadiness on feet: Secondary | ICD-10-CM | POA: Diagnosis not present

## 2021-02-28 DIAGNOSIS — R531 Weakness: Secondary | ICD-10-CM | POA: Diagnosis not present

## 2021-02-28 DIAGNOSIS — M21372 Foot drop, left foot: Secondary | ICD-10-CM | POA: Diagnosis not present

## 2021-02-28 DIAGNOSIS — Z741 Need for assistance with personal care: Secondary | ICD-10-CM | POA: Diagnosis not present

## 2021-03-03 DIAGNOSIS — M6281 Muscle weakness (generalized): Secondary | ICD-10-CM | POA: Diagnosis not present

## 2021-03-03 DIAGNOSIS — M21372 Foot drop, left foot: Secondary | ICD-10-CM | POA: Diagnosis not present

## 2021-03-03 DIAGNOSIS — R531 Weakness: Secondary | ICD-10-CM | POA: Diagnosis not present

## 2021-03-03 DIAGNOSIS — R262 Difficulty in walking, not elsewhere classified: Secondary | ICD-10-CM | POA: Diagnosis not present

## 2021-03-03 DIAGNOSIS — R2681 Unsteadiness on feet: Secondary | ICD-10-CM | POA: Diagnosis not present

## 2021-03-03 DIAGNOSIS — Z741 Need for assistance with personal care: Secondary | ICD-10-CM | POA: Diagnosis not present

## 2021-03-04 DIAGNOSIS — M6281 Muscle weakness (generalized): Secondary | ICD-10-CM | POA: Diagnosis not present

## 2021-03-04 DIAGNOSIS — Z741 Need for assistance with personal care: Secondary | ICD-10-CM | POA: Diagnosis not present

## 2021-03-04 DIAGNOSIS — R2681 Unsteadiness on feet: Secondary | ICD-10-CM | POA: Diagnosis not present

## 2021-03-04 DIAGNOSIS — M21372 Foot drop, left foot: Secondary | ICD-10-CM | POA: Diagnosis not present

## 2021-03-04 DIAGNOSIS — R531 Weakness: Secondary | ICD-10-CM | POA: Diagnosis not present

## 2021-03-04 DIAGNOSIS — R262 Difficulty in walking, not elsewhere classified: Secondary | ICD-10-CM | POA: Diagnosis not present

## 2021-03-06 DIAGNOSIS — R531 Weakness: Secondary | ICD-10-CM | POA: Diagnosis not present

## 2021-03-06 DIAGNOSIS — Z741 Need for assistance with personal care: Secondary | ICD-10-CM | POA: Diagnosis not present

## 2021-03-06 DIAGNOSIS — R262 Difficulty in walking, not elsewhere classified: Secondary | ICD-10-CM | POA: Diagnosis not present

## 2021-03-06 DIAGNOSIS — M6281 Muscle weakness (generalized): Secondary | ICD-10-CM | POA: Diagnosis not present

## 2021-03-06 DIAGNOSIS — E1159 Type 2 diabetes mellitus with other circulatory complications: Secondary | ICD-10-CM | POA: Diagnosis not present

## 2021-03-06 DIAGNOSIS — M21372 Foot drop, left foot: Secondary | ICD-10-CM | POA: Diagnosis not present

## 2021-03-06 DIAGNOSIS — R2681 Unsteadiness on feet: Secondary | ICD-10-CM | POA: Diagnosis not present

## 2021-03-07 DIAGNOSIS — Z741 Need for assistance with personal care: Secondary | ICD-10-CM | POA: Diagnosis not present

## 2021-03-07 DIAGNOSIS — R262 Difficulty in walking, not elsewhere classified: Secondary | ICD-10-CM | POA: Diagnosis not present

## 2021-03-07 DIAGNOSIS — M6281 Muscle weakness (generalized): Secondary | ICD-10-CM | POA: Diagnosis not present

## 2021-03-07 DIAGNOSIS — R2681 Unsteadiness on feet: Secondary | ICD-10-CM | POA: Diagnosis not present

## 2021-03-07 DIAGNOSIS — M21372 Foot drop, left foot: Secondary | ICD-10-CM | POA: Diagnosis not present

## 2021-03-07 DIAGNOSIS — R531 Weakness: Secondary | ICD-10-CM | POA: Diagnosis not present

## 2021-03-10 DIAGNOSIS — Z8616 Personal history of COVID-19: Secondary | ICD-10-CM | POA: Diagnosis not present

## 2021-03-10 DIAGNOSIS — R531 Weakness: Secondary | ICD-10-CM | POA: Diagnosis not present

## 2021-03-10 DIAGNOSIS — M6281 Muscle weakness (generalized): Secondary | ICD-10-CM | POA: Diagnosis not present

## 2021-03-10 DIAGNOSIS — Z741 Need for assistance with personal care: Secondary | ICD-10-CM | POA: Diagnosis not present

## 2021-03-10 DIAGNOSIS — R262 Difficulty in walking, not elsewhere classified: Secondary | ICD-10-CM | POA: Diagnosis not present

## 2021-03-10 DIAGNOSIS — E1169 Type 2 diabetes mellitus with other specified complication: Secondary | ICD-10-CM | POA: Diagnosis not present

## 2021-03-10 DIAGNOSIS — R2681 Unsteadiness on feet: Secondary | ICD-10-CM | POA: Diagnosis not present

## 2021-03-10 DIAGNOSIS — M21372 Foot drop, left foot: Secondary | ICD-10-CM | POA: Diagnosis not present

## 2021-03-11 DIAGNOSIS — R2681 Unsteadiness on feet: Secondary | ICD-10-CM | POA: Diagnosis not present

## 2021-03-11 DIAGNOSIS — M21372 Foot drop, left foot: Secondary | ICD-10-CM | POA: Diagnosis not present

## 2021-03-11 DIAGNOSIS — Z741 Need for assistance with personal care: Secondary | ICD-10-CM | POA: Diagnosis not present

## 2021-03-11 DIAGNOSIS — R531 Weakness: Secondary | ICD-10-CM | POA: Diagnosis not present

## 2021-03-11 DIAGNOSIS — R262 Difficulty in walking, not elsewhere classified: Secondary | ICD-10-CM | POA: Diagnosis not present

## 2021-03-11 DIAGNOSIS — M6281 Muscle weakness (generalized): Secondary | ICD-10-CM | POA: Diagnosis not present

## 2021-03-12 DIAGNOSIS — M6281 Muscle weakness (generalized): Secondary | ICD-10-CM | POA: Diagnosis not present

## 2021-03-12 DIAGNOSIS — M21372 Foot drop, left foot: Secondary | ICD-10-CM | POA: Diagnosis not present

## 2021-03-12 DIAGNOSIS — R531 Weakness: Secondary | ICD-10-CM | POA: Diagnosis not present

## 2021-03-12 DIAGNOSIS — R2681 Unsteadiness on feet: Secondary | ICD-10-CM | POA: Diagnosis not present

## 2021-03-12 DIAGNOSIS — Z741 Need for assistance with personal care: Secondary | ICD-10-CM | POA: Diagnosis not present

## 2021-03-12 DIAGNOSIS — R262 Difficulty in walking, not elsewhere classified: Secondary | ICD-10-CM | POA: Diagnosis not present

## 2021-03-13 DIAGNOSIS — R2681 Unsteadiness on feet: Secondary | ICD-10-CM | POA: Diagnosis not present

## 2021-03-13 DIAGNOSIS — M21372 Foot drop, left foot: Secondary | ICD-10-CM | POA: Diagnosis not present

## 2021-03-13 DIAGNOSIS — R262 Difficulty in walking, not elsewhere classified: Secondary | ICD-10-CM | POA: Diagnosis not present

## 2021-03-13 DIAGNOSIS — Z741 Need for assistance with personal care: Secondary | ICD-10-CM | POA: Diagnosis not present

## 2021-03-13 DIAGNOSIS — R531 Weakness: Secondary | ICD-10-CM | POA: Diagnosis not present

## 2021-03-13 DIAGNOSIS — M6281 Muscle weakness (generalized): Secondary | ICD-10-CM | POA: Diagnosis not present

## 2021-03-18 DIAGNOSIS — R531 Weakness: Secondary | ICD-10-CM | POA: Diagnosis not present

## 2021-03-18 DIAGNOSIS — R262 Difficulty in walking, not elsewhere classified: Secondary | ICD-10-CM | POA: Diagnosis not present

## 2021-03-18 DIAGNOSIS — M21372 Foot drop, left foot: Secondary | ICD-10-CM | POA: Diagnosis not present

## 2021-03-18 DIAGNOSIS — Z741 Need for assistance with personal care: Secondary | ICD-10-CM | POA: Diagnosis not present

## 2021-03-18 DIAGNOSIS — R2681 Unsteadiness on feet: Secondary | ICD-10-CM | POA: Diagnosis not present

## 2021-03-18 DIAGNOSIS — M6281 Muscle weakness (generalized): Secondary | ICD-10-CM | POA: Diagnosis not present

## 2021-03-19 DIAGNOSIS — M6281 Muscle weakness (generalized): Secondary | ICD-10-CM | POA: Diagnosis not present

## 2021-03-19 DIAGNOSIS — Z741 Need for assistance with personal care: Secondary | ICD-10-CM | POA: Diagnosis not present

## 2021-03-19 DIAGNOSIS — R262 Difficulty in walking, not elsewhere classified: Secondary | ICD-10-CM | POA: Diagnosis not present

## 2021-03-19 DIAGNOSIS — R2681 Unsteadiness on feet: Secondary | ICD-10-CM | POA: Diagnosis not present

## 2021-03-19 DIAGNOSIS — M21372 Foot drop, left foot: Secondary | ICD-10-CM | POA: Diagnosis not present

## 2021-03-19 DIAGNOSIS — R531 Weakness: Secondary | ICD-10-CM | POA: Diagnosis not present

## 2021-03-20 DIAGNOSIS — Z741 Need for assistance with personal care: Secondary | ICD-10-CM | POA: Diagnosis not present

## 2021-03-20 DIAGNOSIS — R262 Difficulty in walking, not elsewhere classified: Secondary | ICD-10-CM | POA: Diagnosis not present

## 2021-03-20 DIAGNOSIS — R531 Weakness: Secondary | ICD-10-CM | POA: Diagnosis not present

## 2021-03-20 DIAGNOSIS — M21372 Foot drop, left foot: Secondary | ICD-10-CM | POA: Diagnosis not present

## 2021-03-20 DIAGNOSIS — M6281 Muscle weakness (generalized): Secondary | ICD-10-CM | POA: Diagnosis not present

## 2021-03-20 DIAGNOSIS — R2681 Unsteadiness on feet: Secondary | ICD-10-CM | POA: Diagnosis not present

## 2021-03-21 DIAGNOSIS — R531 Weakness: Secondary | ICD-10-CM | POA: Diagnosis not present

## 2021-03-21 DIAGNOSIS — R262 Difficulty in walking, not elsewhere classified: Secondary | ICD-10-CM | POA: Diagnosis not present

## 2021-03-21 DIAGNOSIS — Z741 Need for assistance with personal care: Secondary | ICD-10-CM | POA: Diagnosis not present

## 2021-03-22 DIAGNOSIS — Z741 Need for assistance with personal care: Secondary | ICD-10-CM | POA: Diagnosis not present

## 2021-03-22 DIAGNOSIS — R262 Difficulty in walking, not elsewhere classified: Secondary | ICD-10-CM | POA: Diagnosis not present

## 2021-03-22 DIAGNOSIS — R531 Weakness: Secondary | ICD-10-CM | POA: Diagnosis not present

## 2021-03-24 DIAGNOSIS — Z741 Need for assistance with personal care: Secondary | ICD-10-CM | POA: Diagnosis not present

## 2021-03-24 DIAGNOSIS — R531 Weakness: Secondary | ICD-10-CM | POA: Diagnosis not present

## 2021-03-24 DIAGNOSIS — R262 Difficulty in walking, not elsewhere classified: Secondary | ICD-10-CM | POA: Diagnosis not present

## 2021-03-25 DIAGNOSIS — Z741 Need for assistance with personal care: Secondary | ICD-10-CM | POA: Diagnosis not present

## 2021-03-25 DIAGNOSIS — R531 Weakness: Secondary | ICD-10-CM | POA: Diagnosis not present

## 2021-03-25 DIAGNOSIS — R262 Difficulty in walking, not elsewhere classified: Secondary | ICD-10-CM | POA: Diagnosis not present

## 2021-03-26 DIAGNOSIS — R262 Difficulty in walking, not elsewhere classified: Secondary | ICD-10-CM | POA: Diagnosis not present

## 2021-03-26 DIAGNOSIS — R531 Weakness: Secondary | ICD-10-CM | POA: Diagnosis not present

## 2021-03-26 DIAGNOSIS — Z741 Need for assistance with personal care: Secondary | ICD-10-CM | POA: Diagnosis not present

## 2021-03-27 DIAGNOSIS — R531 Weakness: Secondary | ICD-10-CM | POA: Diagnosis not present

## 2021-03-27 DIAGNOSIS — Z741 Need for assistance with personal care: Secondary | ICD-10-CM | POA: Diagnosis not present

## 2021-03-27 DIAGNOSIS — R262 Difficulty in walking, not elsewhere classified: Secondary | ICD-10-CM | POA: Diagnosis not present

## 2021-03-28 DIAGNOSIS — R531 Weakness: Secondary | ICD-10-CM | POA: Diagnosis not present

## 2021-03-28 DIAGNOSIS — Z741 Need for assistance with personal care: Secondary | ICD-10-CM | POA: Diagnosis not present

## 2021-03-28 DIAGNOSIS — R262 Difficulty in walking, not elsewhere classified: Secondary | ICD-10-CM | POA: Diagnosis not present

## 2021-03-30 DIAGNOSIS — R262 Difficulty in walking, not elsewhere classified: Secondary | ICD-10-CM | POA: Diagnosis not present

## 2021-03-30 DIAGNOSIS — Z741 Need for assistance with personal care: Secondary | ICD-10-CM | POA: Diagnosis not present

## 2021-03-30 DIAGNOSIS — R531 Weakness: Secondary | ICD-10-CM | POA: Diagnosis not present

## 2021-04-01 DIAGNOSIS — R531 Weakness: Secondary | ICD-10-CM | POA: Diagnosis not present

## 2021-04-01 DIAGNOSIS — R262 Difficulty in walking, not elsewhere classified: Secondary | ICD-10-CM | POA: Diagnosis not present

## 2021-04-01 DIAGNOSIS — Z741 Need for assistance with personal care: Secondary | ICD-10-CM | POA: Diagnosis not present

## 2021-05-14 DIAGNOSIS — Z1152 Encounter for screening for COVID-19: Secondary | ICD-10-CM | POA: Diagnosis not present

## 2021-05-14 DIAGNOSIS — Z20822 Contact with and (suspected) exposure to covid-19: Secondary | ICD-10-CM | POA: Diagnosis not present

## 2021-06-10 DIAGNOSIS — E1159 Type 2 diabetes mellitus with other circulatory complications: Secondary | ICD-10-CM | POA: Diagnosis not present

## 2021-06-17 DIAGNOSIS — Z8616 Personal history of COVID-19: Secondary | ICD-10-CM | POA: Diagnosis not present

## 2021-06-17 DIAGNOSIS — E876 Hypokalemia: Secondary | ICD-10-CM | POA: Diagnosis not present

## 2021-06-17 DIAGNOSIS — E1169 Type 2 diabetes mellitus with other specified complication: Secondary | ICD-10-CM | POA: Diagnosis not present

## 2021-06-17 DIAGNOSIS — E1159 Type 2 diabetes mellitus with other circulatory complications: Secondary | ICD-10-CM | POA: Diagnosis not present

## 2021-06-18 DIAGNOSIS — Z8616 Personal history of COVID-19: Secondary | ICD-10-CM | POA: Diagnosis not present

## 2021-06-18 DIAGNOSIS — E876 Hypokalemia: Secondary | ICD-10-CM | POA: Diagnosis not present

## 2021-06-18 DIAGNOSIS — E1169 Type 2 diabetes mellitus with other specified complication: Secondary | ICD-10-CM | POA: Diagnosis not present

## 2021-07-03 DIAGNOSIS — Z89422 Acquired absence of other left toe(s): Secondary | ICD-10-CM | POA: Diagnosis not present

## 2021-07-03 DIAGNOSIS — E1140 Type 2 diabetes mellitus with diabetic neuropathy, unspecified: Secondary | ICD-10-CM | POA: Diagnosis not present

## 2021-07-03 DIAGNOSIS — L603 Nail dystrophy: Secondary | ICD-10-CM | POA: Diagnosis not present

## 2021-07-03 DIAGNOSIS — B351 Tinea unguium: Secondary | ICD-10-CM | POA: Diagnosis not present

## 2021-08-15 DIAGNOSIS — R059 Cough, unspecified: Secondary | ICD-10-CM | POA: Diagnosis not present

## 2021-08-15 DIAGNOSIS — E785 Hyperlipidemia, unspecified: Secondary | ICD-10-CM | POA: Diagnosis not present

## 2021-08-15 DIAGNOSIS — E1169 Type 2 diabetes mellitus with other specified complication: Secondary | ICD-10-CM | POA: Diagnosis not present

## 2021-08-15 DIAGNOSIS — E876 Hypokalemia: Secondary | ICD-10-CM | POA: Diagnosis not present

## 2021-08-15 DIAGNOSIS — M6282 Rhabdomyolysis: Secondary | ICD-10-CM | POA: Diagnosis not present

## 2021-08-19 DIAGNOSIS — E785 Hyperlipidemia, unspecified: Secondary | ICD-10-CM | POA: Diagnosis not present

## 2021-08-19 DIAGNOSIS — E1169 Type 2 diabetes mellitus with other specified complication: Secondary | ICD-10-CM | POA: Diagnosis not present

## 2021-08-19 DIAGNOSIS — E876 Hypokalemia: Secondary | ICD-10-CM | POA: Diagnosis not present

## 2021-09-18 DIAGNOSIS — G8929 Other chronic pain: Secondary | ICD-10-CM | POA: Diagnosis not present

## 2021-09-18 DIAGNOSIS — E1159 Type 2 diabetes mellitus with other circulatory complications: Secondary | ICD-10-CM | POA: Diagnosis not present

## 2021-09-18 DIAGNOSIS — I739 Peripheral vascular disease, unspecified: Secondary | ICD-10-CM | POA: Diagnosis not present

## 2021-09-18 DIAGNOSIS — E785 Hyperlipidemia, unspecified: Secondary | ICD-10-CM | POA: Diagnosis not present

## 2021-09-18 DIAGNOSIS — F32A Depression, unspecified: Secondary | ICD-10-CM | POA: Diagnosis not present

## 2021-10-31 DIAGNOSIS — E1159 Type 2 diabetes mellitus with other circulatory complications: Secondary | ICD-10-CM | POA: Diagnosis not present

## 2021-11-10 DIAGNOSIS — E1159 Type 2 diabetes mellitus with other circulatory complications: Secondary | ICD-10-CM | POA: Diagnosis not present

## 2021-11-17 DIAGNOSIS — E785 Hyperlipidemia, unspecified: Secondary | ICD-10-CM | POA: Diagnosis not present

## 2021-11-17 DIAGNOSIS — H2513 Age-related nuclear cataract, bilateral: Secondary | ICD-10-CM | POA: Diagnosis not present

## 2021-11-17 DIAGNOSIS — E119 Type 2 diabetes mellitus without complications: Secondary | ICD-10-CM | POA: Diagnosis not present

## 2021-11-17 DIAGNOSIS — Z8616 Personal history of COVID-19: Secondary | ICD-10-CM | POA: Diagnosis not present

## 2021-11-17 DIAGNOSIS — E1169 Type 2 diabetes mellitus with other specified complication: Secondary | ICD-10-CM | POA: Diagnosis not present

## 2021-12-28 DIAGNOSIS — M21372 Foot drop, left foot: Secondary | ICD-10-CM | POA: Diagnosis not present

## 2021-12-28 DIAGNOSIS — I739 Peripheral vascular disease, unspecified: Secondary | ICD-10-CM | POA: Diagnosis not present

## 2021-12-28 DIAGNOSIS — F32A Depression, unspecified: Secondary | ICD-10-CM | POA: Diagnosis not present

## 2021-12-28 DIAGNOSIS — Z9181 History of falling: Secondary | ICD-10-CM | POA: Diagnosis not present

## 2022-01-28 DIAGNOSIS — M6281 Muscle weakness (generalized): Secondary | ICD-10-CM | POA: Diagnosis not present

## 2022-01-28 DIAGNOSIS — M21372 Foot drop, left foot: Secondary | ICD-10-CM | POA: Diagnosis not present

## 2022-01-28 DIAGNOSIS — R2681 Unsteadiness on feet: Secondary | ICD-10-CM | POA: Diagnosis not present

## 2022-01-28 DIAGNOSIS — R531 Weakness: Secondary | ICD-10-CM | POA: Diagnosis not present

## 2022-01-29 DIAGNOSIS — R531 Weakness: Secondary | ICD-10-CM | POA: Diagnosis not present

## 2022-01-29 DIAGNOSIS — R2681 Unsteadiness on feet: Secondary | ICD-10-CM | POA: Diagnosis not present

## 2022-01-29 DIAGNOSIS — M6281 Muscle weakness (generalized): Secondary | ICD-10-CM | POA: Diagnosis not present

## 2022-01-29 DIAGNOSIS — M21372 Foot drop, left foot: Secondary | ICD-10-CM | POA: Diagnosis not present

## 2022-01-30 DIAGNOSIS — M21372 Foot drop, left foot: Secondary | ICD-10-CM | POA: Diagnosis not present

## 2022-01-30 DIAGNOSIS — R2681 Unsteadiness on feet: Secondary | ICD-10-CM | POA: Diagnosis not present

## 2022-01-30 DIAGNOSIS — M6281 Muscle weakness (generalized): Secondary | ICD-10-CM | POA: Diagnosis not present

## 2022-01-30 DIAGNOSIS — R531 Weakness: Secondary | ICD-10-CM | POA: Diagnosis not present

## 2022-02-02 DIAGNOSIS — R2681 Unsteadiness on feet: Secondary | ICD-10-CM | POA: Diagnosis not present

## 2022-02-02 DIAGNOSIS — M6281 Muscle weakness (generalized): Secondary | ICD-10-CM | POA: Diagnosis not present

## 2022-02-02 DIAGNOSIS — Z1152 Encounter for screening for COVID-19: Secondary | ICD-10-CM | POA: Diagnosis not present

## 2022-02-02 DIAGNOSIS — R531 Weakness: Secondary | ICD-10-CM | POA: Diagnosis not present

## 2022-02-02 DIAGNOSIS — M21372 Foot drop, left foot: Secondary | ICD-10-CM | POA: Diagnosis not present

## 2022-02-02 DIAGNOSIS — M6282 Rhabdomyolysis: Secondary | ICD-10-CM | POA: Diagnosis not present

## 2022-02-04 DIAGNOSIS — M6281 Muscle weakness (generalized): Secondary | ICD-10-CM | POA: Diagnosis not present

## 2022-02-04 DIAGNOSIS — M21372 Foot drop, left foot: Secondary | ICD-10-CM | POA: Diagnosis not present

## 2022-02-04 DIAGNOSIS — R2681 Unsteadiness on feet: Secondary | ICD-10-CM | POA: Diagnosis not present

## 2022-02-04 DIAGNOSIS — R531 Weakness: Secondary | ICD-10-CM | POA: Diagnosis not present

## 2022-02-06 DIAGNOSIS — R2681 Unsteadiness on feet: Secondary | ICD-10-CM | POA: Diagnosis not present

## 2022-02-06 DIAGNOSIS — R531 Weakness: Secondary | ICD-10-CM | POA: Diagnosis not present

## 2022-02-06 DIAGNOSIS — M21372 Foot drop, left foot: Secondary | ICD-10-CM | POA: Diagnosis not present

## 2022-02-06 DIAGNOSIS — M6281 Muscle weakness (generalized): Secondary | ICD-10-CM | POA: Diagnosis not present

## 2022-02-09 DIAGNOSIS — M6281 Muscle weakness (generalized): Secondary | ICD-10-CM | POA: Diagnosis not present

## 2022-02-09 DIAGNOSIS — Z1152 Encounter for screening for COVID-19: Secondary | ICD-10-CM | POA: Diagnosis not present

## 2022-02-09 DIAGNOSIS — R531 Weakness: Secondary | ICD-10-CM | POA: Diagnosis not present

## 2022-02-09 DIAGNOSIS — E876 Hypokalemia: Secondary | ICD-10-CM | POA: Diagnosis not present

## 2022-02-09 DIAGNOSIS — M21372 Foot drop, left foot: Secondary | ICD-10-CM | POA: Diagnosis not present

## 2022-02-09 DIAGNOSIS — E1159 Type 2 diabetes mellitus with other circulatory complications: Secondary | ICD-10-CM | POA: Diagnosis not present

## 2022-02-09 DIAGNOSIS — R2681 Unsteadiness on feet: Secondary | ICD-10-CM | POA: Diagnosis not present

## 2022-02-11 DIAGNOSIS — M21372 Foot drop, left foot: Secondary | ICD-10-CM | POA: Diagnosis not present

## 2022-02-11 DIAGNOSIS — M6281 Muscle weakness (generalized): Secondary | ICD-10-CM | POA: Diagnosis not present

## 2022-02-11 DIAGNOSIS — R531 Weakness: Secondary | ICD-10-CM | POA: Diagnosis not present

## 2022-02-11 DIAGNOSIS — R2681 Unsteadiness on feet: Secondary | ICD-10-CM | POA: Diagnosis not present

## 2022-02-13 DIAGNOSIS — M6281 Muscle weakness (generalized): Secondary | ICD-10-CM | POA: Diagnosis not present

## 2022-02-13 DIAGNOSIS — R531 Weakness: Secondary | ICD-10-CM | POA: Diagnosis not present

## 2022-02-13 DIAGNOSIS — R2681 Unsteadiness on feet: Secondary | ICD-10-CM | POA: Diagnosis not present

## 2022-02-13 DIAGNOSIS — M21372 Foot drop, left foot: Secondary | ICD-10-CM | POA: Diagnosis not present

## 2022-02-15 DIAGNOSIS — M21372 Foot drop, left foot: Secondary | ICD-10-CM | POA: Diagnosis not present

## 2022-02-15 DIAGNOSIS — M6281 Muscle weakness (generalized): Secondary | ICD-10-CM | POA: Diagnosis not present

## 2022-02-15 DIAGNOSIS — R2681 Unsteadiness on feet: Secondary | ICD-10-CM | POA: Diagnosis not present

## 2022-02-15 DIAGNOSIS — R531 Weakness: Secondary | ICD-10-CM | POA: Diagnosis not present

## 2022-02-19 DIAGNOSIS — R531 Weakness: Secondary | ICD-10-CM | POA: Diagnosis not present

## 2022-02-19 DIAGNOSIS — M6281 Muscle weakness (generalized): Secondary | ICD-10-CM | POA: Diagnosis not present

## 2022-02-19 DIAGNOSIS — G8929 Other chronic pain: Secondary | ICD-10-CM | POA: Diagnosis not present

## 2022-02-20 DIAGNOSIS — M6281 Muscle weakness (generalized): Secondary | ICD-10-CM | POA: Diagnosis not present

## 2022-02-20 DIAGNOSIS — G8929 Other chronic pain: Secondary | ICD-10-CM | POA: Diagnosis not present

## 2022-02-20 DIAGNOSIS — R531 Weakness: Secondary | ICD-10-CM | POA: Diagnosis not present

## 2022-02-23 DIAGNOSIS — M6281 Muscle weakness (generalized): Secondary | ICD-10-CM | POA: Diagnosis not present

## 2022-02-23 DIAGNOSIS — G8929 Other chronic pain: Secondary | ICD-10-CM | POA: Diagnosis not present

## 2022-02-23 DIAGNOSIS — R531 Weakness: Secondary | ICD-10-CM | POA: Diagnosis not present

## 2022-02-24 DIAGNOSIS — R531 Weakness: Secondary | ICD-10-CM | POA: Diagnosis not present

## 2022-02-24 DIAGNOSIS — M6281 Muscle weakness (generalized): Secondary | ICD-10-CM | POA: Diagnosis not present

## 2022-02-24 DIAGNOSIS — G8929 Other chronic pain: Secondary | ICD-10-CM | POA: Diagnosis not present

## 2022-02-25 DIAGNOSIS — G8929 Other chronic pain: Secondary | ICD-10-CM | POA: Diagnosis not present

## 2022-02-25 DIAGNOSIS — M6281 Muscle weakness (generalized): Secondary | ICD-10-CM | POA: Diagnosis not present

## 2022-02-25 DIAGNOSIS — R531 Weakness: Secondary | ICD-10-CM | POA: Diagnosis not present

## 2022-04-14 DIAGNOSIS — Z89422 Acquired absence of other left toe(s): Secondary | ICD-10-CM | POA: Diagnosis not present

## 2022-04-14 DIAGNOSIS — L603 Nail dystrophy: Secondary | ICD-10-CM | POA: Diagnosis not present

## 2022-05-16 DIAGNOSIS — M21372 Foot drop, left foot: Secondary | ICD-10-CM | POA: Diagnosis not present

## 2022-05-16 DIAGNOSIS — E1169 Type 2 diabetes mellitus with other specified complication: Secondary | ICD-10-CM | POA: Diagnosis not present

## 2022-05-16 DIAGNOSIS — E1159 Type 2 diabetes mellitus with other circulatory complications: Secondary | ICD-10-CM | POA: Diagnosis not present

## 2022-05-16 DIAGNOSIS — R5381 Other malaise: Secondary | ICD-10-CM | POA: Diagnosis not present

## 2022-05-16 DIAGNOSIS — E785 Hyperlipidemia, unspecified: Secondary | ICD-10-CM | POA: Diagnosis not present

## 2022-05-16 DIAGNOSIS — I10 Essential (primary) hypertension: Secondary | ICD-10-CM | POA: Diagnosis not present

## 2022-05-16 DIAGNOSIS — F32A Depression, unspecified: Secondary | ICD-10-CM | POA: Diagnosis not present

## 2022-06-22 DIAGNOSIS — E1159 Type 2 diabetes mellitus with other circulatory complications: Secondary | ICD-10-CM | POA: Diagnosis not present

## 2022-06-22 DIAGNOSIS — E876 Hypokalemia: Secondary | ICD-10-CM | POA: Diagnosis not present

## 2022-08-26 DIAGNOSIS — E785 Hyperlipidemia, unspecified: Secondary | ICD-10-CM | POA: Diagnosis not present

## 2022-08-26 DIAGNOSIS — E876 Hypokalemia: Secondary | ICD-10-CM | POA: Diagnosis not present

## 2022-08-26 DIAGNOSIS — E1169 Type 2 diabetes mellitus with other specified complication: Secondary | ICD-10-CM | POA: Diagnosis not present

## 2022-08-26 DIAGNOSIS — M21372 Foot drop, left foot: Secondary | ICD-10-CM | POA: Diagnosis not present

## 2022-08-26 DIAGNOSIS — E1159 Type 2 diabetes mellitus with other circulatory complications: Secondary | ICD-10-CM | POA: Diagnosis not present

## 2022-09-04 DIAGNOSIS — E1159 Type 2 diabetes mellitus with other circulatory complications: Secondary | ICD-10-CM | POA: Diagnosis not present
